# Patient Record
Sex: Female | Born: 1995 | Race: White | Hispanic: No | Marital: Married | Smoking: Never smoker
Health system: Southern US, Community
[De-identification: ages and names within clinical notes are randomized; demographics above are authoritative.]

## PROBLEM LIST (undated history)

## (undated) DIAGNOSIS — M7989 Other specified soft tissue disorders: Secondary | ICD-10-CM

## (undated) DIAGNOSIS — R0602 Shortness of breath: Secondary | ICD-10-CM

## (undated) DIAGNOSIS — F909 Attention-deficit hyperactivity disorder, unspecified type: Secondary | ICD-10-CM

## (undated) DIAGNOSIS — R7303 Prediabetes: Secondary | ICD-10-CM

## (undated) DIAGNOSIS — K5900 Constipation, unspecified: Secondary | ICD-10-CM

## (undated) DIAGNOSIS — S62101A Fracture of unspecified carpal bone, right wrist, initial encounter for closed fracture: Secondary | ICD-10-CM

## (undated) DIAGNOSIS — F341 Dysthymic disorder: Secondary | ICD-10-CM

## (undated) DIAGNOSIS — F329 Major depressive disorder, single episode, unspecified: Secondary | ICD-10-CM

## (undated) DIAGNOSIS — S62102A Fracture of unspecified carpal bone, left wrist, initial encounter for closed fracture: Secondary | ICD-10-CM

## (undated) DIAGNOSIS — F32A Depression, unspecified: Secondary | ICD-10-CM

## (undated) DIAGNOSIS — N946 Dysmenorrhea, unspecified: Secondary | ICD-10-CM

## (undated) DIAGNOSIS — T7840XA Allergy, unspecified, initial encounter: Secondary | ICD-10-CM

## (undated) DIAGNOSIS — F419 Anxiety disorder, unspecified: Secondary | ICD-10-CM

## (undated) DIAGNOSIS — J45909 Unspecified asthma, uncomplicated: Secondary | ICD-10-CM

## (undated) DIAGNOSIS — S43006A Unspecified dislocation of unspecified shoulder joint, initial encounter: Secondary | ICD-10-CM

## (undated) DIAGNOSIS — F913 Oppositional defiant disorder: Secondary | ICD-10-CM

## (undated) DIAGNOSIS — E559 Vitamin D deficiency, unspecified: Secondary | ICD-10-CM

## (undated) DIAGNOSIS — Z8619 Personal history of other infectious and parasitic diseases: Secondary | ICD-10-CM

## (undated) DIAGNOSIS — G43909 Migraine, unspecified, not intractable, without status migrainosus: Secondary | ICD-10-CM

## (undated) HISTORY — DX: Prediabetes: R73.03

## (undated) HISTORY — DX: Unspecified dislocation of unspecified shoulder joint, initial encounter: S43.006A

## (undated) HISTORY — DX: Vitamin D deficiency, unspecified: E55.9

## (undated) HISTORY — DX: Allergy, unspecified, initial encounter: T78.40XA

## (undated) HISTORY — DX: Anxiety disorder, unspecified: F41.9

## (undated) HISTORY — DX: Fracture of unspecified carpal bone, left wrist, initial encounter for closed fracture: S62.102A

## (undated) HISTORY — DX: Unspecified asthma, uncomplicated: J45.909

## (undated) HISTORY — DX: Shortness of breath: R06.02

## (undated) HISTORY — DX: Depression, unspecified: F32.A

## (undated) HISTORY — DX: Personal history of other infectious and parasitic diseases: Z86.19

## (undated) HISTORY — DX: Attention-deficit hyperactivity disorder, unspecified type: F90.9

## (undated) HISTORY — DX: Oppositional defiant disorder: F91.3

## (undated) HISTORY — DX: Constipation, unspecified: K59.00

## (undated) HISTORY — DX: Major depressive disorder, single episode, unspecified: F32.9

## (undated) HISTORY — DX: Other specified soft tissue disorders: M79.89

## (undated) HISTORY — DX: Migraine, unspecified, not intractable, without status migrainosus: G43.909

## (undated) HISTORY — DX: Dysthymic disorder: F34.1

## (undated) HISTORY — DX: Dysmenorrhea, unspecified: N94.6

## (undated) HISTORY — DX: Fracture of unspecified carpal bone, right wrist, initial encounter for closed fracture: S62.101A

---
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
## 2014-01-07 ENCOUNTER — Ambulatory Visit (INDEPENDENT_AMBULATORY_CARE_PROVIDER_SITE_OTHER): Payer: BC Managed Care – PPO | Admitting: Internal Medicine

## 2014-01-07 VITALS — BP 122/78 | HR 74 | Temp 98.2°F | Resp 18 | Ht 64.5 in | Wt 242.0 lb

## 2014-01-07 DIAGNOSIS — J019 Acute sinusitis, unspecified: Secondary | ICD-10-CM

## 2014-01-07 MED ORDER — AMOXICILLIN 875 MG PO TABS: 875.0000 mg | ORAL_TABLET | Freq: Two times a day (BID) | ORAL | Status: DC

## 2014-01-07 NOTE — Progress Notes (Signed)
This chart was scribed for Herbie Baltimore, MD by Einar Pheasant, ED Scribe. This patient was seen in room 10 and the patient's care was started at 4:06 PM.  Subjective:    Patient ID: Wendy Levy, female    DOB: Mar 14, 1996, 18 y.o.   MRN: [id number] Chief Complaint  Patient presents with  . flu like sxs    2 weeks   . Headache    today   . Excessive Sweating    HPI HPI Comments: Wendy Levy is a 18 y.o. female who presents to [facility] complaining of flu like symptoms that stated 2 weeks ago. She is also complaining of associated headache, congestion, and diaphoresis. Pt states that every morning upon waking she has a sore throat and she finds herself coughing once in a while. Mother states that she has not been able to complete a full day of school ever since the onset of her symptoms. Pt denies any fever.    Patient Active Problem List   Diagnosis Date Noted  . Migraine, unspecified, without mention of intractable migraine without mention of status migrainosus 03/15/2013  . Major depressive disorder, recurrent episode, in partial or unspecified remission 01/31/2012  . ADHD (attention deficit hyperactivity disorder), combined type 11/02/2011  . ODD (oppositional defiant disorder) 11/02/2011  . Dysthymic disorder 11/02/2011   Past Medical History  Diagnosis Date  . ADHD (attention deficit hyperactivity disorder)   . Depression   . Oppositional defiant disorder   . Wrist fracture, bilateral   . Shoulder dislocation     bilateral   Past Surgical History  Procedure Laterality Date  . Wisdom tooth extraction  04/2013   No Known Allergies Prior to Admission medications   Medication Sig Start Date End Date Taking? Authorizing Provider  cetirizine (ZYRTEC) 10 MG tablet Take 10 mg by mouth daily as needed for allergies.   Yes Historical Provider, MD  FLUoxetine (PROZAC) 20 MG capsule TAKE 1 CAPSULE BY MOUTH ONCE DAILY 11/20/13  Yes Hampton Abbot, MD    lisdexamfetamine (VYVANSE) 40 MG capsule Take 1 capsule (40 mg total) by mouth every morning. 12/03/13 03/04/14 Yes Hampton Abbot, MD  norethindrone-ethinyl estradiol-iron (MICROGESTIN FE,GILDESS FE,LOESTRIN FE) 1.5-30 MG-MCG tablet Take 1 tablet by mouth daily. 03/15/13  Yes Fara Chute, PA-C   History   Social History  . Marital Status: Single    Spouse Name: n/a    Number of Children: 0  . Years of Education: N/A   Occupational History  . 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 Smoking status: Never Smoker   . Smokeless tobacco: Never Used  . Alcohol Use: No  . Drug Use: No  . Sexual Activity: Not Currently    Birth Control/ Protection: Condom   Other Topics Concern  . Not on file   Social History Narrative   Lives with her mother (adopted).  Her sister lives in [place], [place].     Review of Systems A complete 10 system review of systems was obtained and all systems are negative except as noted in the HPI and PMH.       Triage vitals: BP 122/78  Pulse 74  Temp(Src) 98.2 F (36.8 C) (Oral)  Resp 18  Ht 5' 4.5" (1.638 m)  Wt 242 lb (109.77 kg)  BMI 40.91 kg/m2  SpO2 98%  LMP 12/10/2013  Objective:   Physical Exam  Nursing note and vitals reviewed. Constitutional:  Overweight  HENT:  Right Ear: External ear normal.  Left Ear: External ear normal.  Mouth/Throat: Oropharynx is clear and moist.  Purulent discharge from nares.  Eyes: Conjunctivae are normal.  Pulmonary/Chest: Effort normal and breath sounds normal. No respiratory distress. She has no wheezes. She has no rales. She exhibits no tenderness.  Lymphadenopathy:    She has no cervical adenopathy.          Assessment & Plan:   Acute sinusitis, unspecified  Meds ordered this encounter  Medications  . amoxicillin (AMOXIL) 875 MG tablet    Sig: Take 1 tablet (875 mg total) by mouth 2 (two) times daily.    Dispense:  20 tablet    Refill:  0    Sudafed 12 Hour twice a day for 5 days  I have completed the patient encounter in its entirety as documented by the scribe, with editing by me where necessary. Michole Lecuyer P. Laney Pastor, M.D.

## 2014-01-16 ENCOUNTER — Ambulatory Visit (INDEPENDENT_AMBULATORY_CARE_PROVIDER_SITE_OTHER): Payer: BC Managed Care – PPO | Admitting: Psychology

## 2014-01-16 DIAGNOSIS — F341 Dysthymic disorder: Secondary | ICD-10-CM

## 2014-01-16 NOTE — Progress Notes (Signed)
   THERAPIST PROGRESS NOTE  Session Time: 3.25pm-4.10pm  Participation Level: Active  Behavioral Response: Well GroomedAlertWNL  Type of Therapy: Individual Therapy  Treatment Goals addressed: Diagnosis: Dysthymia and goal 1&2.  Interventions: CBT and Supportive  Summary: Wendy Levy is a 18 y.o. female who presents with affect WNL. Pt reports that she has missed a lot of school recently due to flu like symptoms and sinus infection. Pt reports somewhat overhwelmed by makeup work but feels good support.  Pt reported did well on semester grades 3As, 2Bs, 1C.  Pt reports "senioritis" but keeping goal directed for graduation.  Pt reported on how she and sister are communicating and how she has been able to assert self w/sister and accept that sister cannot be consistent emotional support to her current.  Suicidal/Homicidal: Nowithout intent/plan  Therapist Response: Assessed pt current functioning per her report.  Processed w/pt missed days and stressors w/ school work.  Encouraged pt to utilize supports and plan for slow steady approach.  Processed w/pt communication w/ sister and reiterated i messages.   Plan: Return again in 2 weeks.  Diagnosis: Axis I: Dysthymic Disorder    Axis II: No diagnosis    Siniya Lichty, LPC 01/16/2014

## 2014-02-06 ENCOUNTER — Ambulatory Visit (INDEPENDENT_AMBULATORY_CARE_PROVIDER_SITE_OTHER): Payer: BC Managed Care – PPO | Admitting: Psychology

## 2014-02-06 DIAGNOSIS — F341 Dysthymic disorder: Secondary | ICD-10-CM

## 2014-02-06 NOTE — Progress Notes (Signed)
   THERAPIST PROGRESS NOTE  Session Time: 2.25pm-2.45pm  Participation Level: Active  Behavioral Response: Well GroomedAlertAnxious  Type of Therapy: Individual Therapy  Treatment Goals addressed: Diagnosis: dysthymia and improving academic functioing and effective communication  Interventions: Solution Focused, Psychosocial Skills: effective communication skills and Supportive  Summary: Wendy Levy is a 19 y.o. female who presents with expressed worry and anxiety.  Pt informed of drop in grades on recent progress reports and avoidance in sharing w/ mom for fear of mom disappointment and further pushing her.  Pt identifies grades drop w/ missed school days due to illness and assignments not turned in.  Pt was able to identify a plan for completion and focus on school assignments for improving by report cards.  Pt also reported stress that she has been dropped from online class as wasn't approved in time.  Pt agreed that better for her to communicate to mom and not mom to find out from teachers. Pt was able to identify assertive ways of expressing self and communicating her plan to mom.   Suicidal/Homicidal: Nowithout intent/plan  Therapist Response: assessed pt current functioning per her report.  Processed w/ pt stressor and how to approach for best outcome. encouraged pt to not avoid and assisted in identify how to communicate to mom.  Discussed w/ pt her plan for bringing up grades.  Plan: Return again in 2 weeks.  Diagnosis: Axis I: ADHD, combined type and Dysthymic Disorder    Axis II: No diagnosis    YATES,LEANNE, LPC 02/06/2014

## 2014-02-12 ENCOUNTER — Ambulatory Visit (INDEPENDENT_AMBULATORY_CARE_PROVIDER_SITE_OTHER): Payer: BC Managed Care – PPO | Admitting: Family Medicine

## 2014-02-12 ENCOUNTER — Ambulatory Visit: Payer: BC Managed Care – PPO

## 2014-02-12 VITALS — BP 112/80 | HR 90 | Temp 98.0°F | Resp 16 | Ht 64.5 in | Wt 246.2 lb

## 2014-02-12 DIAGNOSIS — R109 Unspecified abdominal pain: Secondary | ICD-10-CM

## 2014-02-12 DIAGNOSIS — R300 Dysuria: Secondary | ICD-10-CM

## 2014-02-12 DIAGNOSIS — Z13220 Encounter for screening for lipoid disorders: Secondary | ICD-10-CM

## 2014-02-12 DIAGNOSIS — Z131 Encounter for screening for diabetes mellitus: Secondary | ICD-10-CM

## 2014-02-12 DIAGNOSIS — R319 Hematuria, unspecified: Secondary | ICD-10-CM

## 2014-02-12 DIAGNOSIS — R3913 Splitting of urinary stream: Secondary | ICD-10-CM

## 2014-02-12 LAB — POCT URINALYSIS DIPSTICK
Bilirubin, UA: NEGATIVE
Glucose, UA: NEGATIVE
Ketones, UA: NEGATIVE
Leukocytes, UA: NEGATIVE
Nitrite, UA: NEGATIVE
Protein, UA: NEGATIVE
Spec Grav, UA: 1.015
Urobilinogen, UA: 0.2
pH, UA: 6

## 2014-02-12 LAB — POCT CBC
Granulocyte percent: 71.8 %G (ref 37–80)
HCT, POC: 44.8 % (ref 37.7–47.9)
Hemoglobin: 14.3 g/dL (ref 12.2–16.2)
Lymph, poc: 3.2 (ref 0.6–3.4)
MCH, POC: 27.9 pg (ref 27–31.2)
MCHC: 31.9 g/dL (ref 31.8–35.4)
MCV: 87.4 fL (ref 80–97)
MID (cbc): 0.7 (ref 0–0.9)
MPV: 8.7 fL (ref 0–99.8)
POC Granulocyte: 9.8 — AB (ref 2–6.9)
POC LYMPH PERCENT: 23.2 % (ref 10–50)
POC MID %: 5 %M (ref 0–12)
Platelet Count, POC: 400 10*3/uL (ref 142–424)
RBC: 5.13 M/uL (ref 4.04–5.48)
RDW, POC: 15.1 %
WBC: 13.6 10*3/uL — AB (ref 4.6–10.2)

## 2014-02-12 LAB — COMPLETE METABOLIC PANEL WITH GFR
Albumin: 4.2 g/dL (ref 3.5–5.2)
Alkaline Phosphatase: 73 U/L (ref 39–117)
BUN: 11 mg/dL (ref 6–23)
CO2: 20 mEq/L (ref 19–32)
Calcium: 9.4 mg/dL (ref 8.4–10.5)
Chloride: 100 mEq/L (ref 96–112)
Creat: 0.73 mg/dL (ref 0.50–1.10)
GFR, Est African American: >89 mL/min
GFR, Est Non African American: >89 mL/min
Glucose, Bld: 76 mg/dL (ref 70–99)
Potassium: 5 mEq/L (ref 3.5–5.3)
Total Protein: 7.1 g/dL (ref 6.0–8.3)

## 2014-02-12 LAB — LIPID PANEL
Cholesterol: 211 mg/dL — ABNORMAL HIGH (ref 0–169)
HDL: 51 mg/dL (ref >34)
LDL Cholesterol: 125 mg/dL — ABNORMAL HIGH (ref 0–109)
Total CHOL/HDL Ratio: 4.1 Ratio
Triglycerides: 174 mg/dL — ABNORMAL HIGH (ref <150)
VLDL: 35 mg/dL (ref 0–40)

## 2014-02-12 LAB — COMPLETE METABOLIC PANEL WITHOUT GFR
ALT: 23 U/L (ref 0–35)
AST: 24 U/L (ref 0–37)
Sodium: 136 meq/L (ref 135–145)
Total Bilirubin: 0.5 mg/dL (ref 0.2–1.1)

## 2014-02-12 LAB — POCT UA - MICROSCOPIC ONLY
Bacteria, U Microscopic: NEGATIVE
Casts, Ur, LPF, POC: NEGATIVE
Crystals, Ur, HPF, POC: NEGATIVE
Mucus, UA: NEGATIVE
WBC, Ur, HPF, POC: NEGATIVE
Yeast, UA: NEGATIVE

## 2014-02-12 LAB — POCT GLYCOSYLATED HEMOGLOBIN (HGB A1C): Hemoglobin A1C: 5.2

## 2014-02-12 LAB — POCT URINE PREGNANCY: Preg Test, Ur: NEGATIVE

## 2014-02-12 MED ORDER — CIPROFLOXACIN HCL 250 MG PO TABS: 250.0000 mg | ORAL_TABLET | Freq: Two times a day (BID) | ORAL | Status: DC

## 2014-02-12 NOTE — Patient Instructions (Signed)

## 2014-02-12 NOTE — Progress Notes (Signed)
Chief Complaint:  Chief Complaint  Patient presents with  . Flank Pain    Noticed yesturday   . Dysuria  . Nausea    last night     HPI: Wendy Levy is a 18 y.o. female who is here for right side flank pain that shoots down to the lower abdomen on the right side started yesterday.  Associated with nausea. She has had the same pain this morning. 4-5/10 discomfort, not constant. It occurred before she ate yesterday. She also has had some burning during urination. No fever, no vomiting, she states she did have nausea. She has not noticed any blood in her urine. She has never had a kidney stone in the past no family history of kidney stones. She has had a history of similar sxs in 06/2013 and [place] of abd was negative for gallstones , she had urine cx which was inconclusive and nonspecific, per mom she got better with cipro. Her UA at that time had both trace leuks and blood. Denies fevers, chills, abdominal pain  She had her LMP 1 week ago, on birth control She denies constipation  Past Medical History  Diagnosis Date  . ADHD (attention deficit hyperactivity disorder)   . Depression   . Oppositional defiant disorder   . Wrist fracture, bilateral   . Shoulder dislocation     bilateral   Past Surgical History  Procedure Laterality Date  . Wisdom tooth extraction  04/2013   History   Social History  . Marital Status: Single    Spouse Name: n/a    Number of Children: 0  . Years of Education: N/A   Occupational History  . 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 Smoking status: Never Smoker   . Smokeless tobacco: Never Used  . Alcohol Use: No  . Drug Use: No  . Sexual Activity: Not Currently    Birth Control/ Protection: Condom   Other Topics Concern  . None   Social History Narrative   Lives with her mother (adopted).  Her sister lives in [place], [place].   Family History  Problem Relation Age of Onset  . Adopted: Yes    . Depression Sister     reportedly in and out of treatment facilities, also dx w/ RAD  . Suicidality Sister    No Known Allergies Prior to Admission medications   Medication Sig Start Date End Date Taking? Authorizing Provider  cetirizine (ZYRTEC) 10 MG tablet Take 10 mg by mouth daily as needed for allergies.   Yes Historical Provider, MD  FLUoxetine (PROZAC) 20 MG capsule TAKE 1 CAPSULE BY MOUTH ONCE DAILY 11/20/13  Yes Hampton Abbot, MD  lisdexamfetamine (VYVANSE) 40 MG capsule Take 1 capsule (40 mg total) by mouth every morning. 12/03/13 03/04/14 Yes Hampton Abbot, MD  norethindrone-ethinyl estradiol-iron (MICROGESTIN FE,GILDESS FE,LOESTRIN FE) 1.5-30 MG-MCG tablet Take 1 tablet by mouth daily. 03/15/13  Yes Chelle S Jeffery, PA-C  amoxicillin (AMOXIL) 875 MG tablet Take 1 tablet (875 mg total) by mouth 2 (two) times daily. 01/07/14   Leandrew Koyanagi, MD     ROS: The patient denies fevers, chills, night sweats, unintentional weight loss, chest pain, palpitations, wheezing, dyspnea on exertion,  vomiting, abdominal pain, dysuria,  hematuria, melena, numbness, weakness, or tingling.   All other systems have been reviewed and were otherwise negative with the exception of those mentioned in the HPI and as above.  PHYSICAL EXAM: Filed Vitals:   02/12/14 0852  BP: 112/80  Pulse: 90  Temp: 98 F (36.7 C)  Resp: 16   Filed Vitals:   02/12/14 0852  Height: 5' 4.5" (1.638 m)  Weight: 246 lb 3.2 oz (111.676 kg)   Body mass index is 41.62 kg/(m^2).  General: Alert, no acute distress, obese white female HEENT:  Normocephalic, atraumatic, oropharynx patent. EOMI, PERRLA Cardiovascular:  Regular rate and rhythm, no rubs murmurs or gallops.  No Carotid bruits, radial pulse intact. No pedal edema.  Respiratory: Clear to auscultation bilaterally.  No wheezes, rales, or rhonchi.  No cyanosis, no use of accessory musculature GI: No organomegaly, abdomen is soft and non-tender at thsi time, she  doe snot even have flank pain, she does not appear to have an acute abdomen. positive bowel sounds.  No masses. No CVA tenderness Skin: No rashes. Neurologic: Facial musculature symmetric. Psychiatric: Patient is appropriate throughout our interaction. Lymphatic: No cervical lymphadenopathy Musculoskeletal: Gait intact.   LABS: Results for orders placed in visit on 02/12/14  POCT URINALYSIS DIPSTICK      Result Value Ref Range   Color, UA yellow     Clarity, UA clear     Glucose, UA neg     Bilirubin, UA neg     Ketones, UA neg     Spec Grav, UA 1.015     Blood, UA large     pH, UA 6.0     Protein, UA neg     Urobilinogen, UA 0.2     Nitrite, UA neg     Leukocytes, UA Negative    POCT UA - MICROSCOPIC ONLY      Result Value Ref Range   WBC, Ur, HPF, POC neg     RBC, urine, microscopic 8-12     Bacteria, U Microscopic neg     Mucus, UA neg     Epithelial cells, urine per micros 6-12     Crystals, Ur, HPF, POC neg     Casts, Ur, LPF, POC neg     Yeast, UA neg    POCT CBC      Result Value Ref Range   WBC 13.6 (*) 4.6 - 10.2 K/uL   Lymph, poc 3.2  0.6 - 3.4   POC LYMPH PERCENT 23.2  10 - 50 %L   MID (cbc) 0.7  0 - 0.9   POC MID % 5.0  0 - 12 %M   POC Granulocyte 9.8 (*) 2 - 6.9   Granulocyte percent 71.8  37 - 80 %G   RBC 5.13  4.04 - 5.48 M/uL   Hemoglobin 14.3  12.2 - 16.2 g/dL   HCT, POC 44.8  37.7 - 47.9 %   MCV 87.4  80 - 97 fL   MCH, POC 27.9  27 - 31.2 pg   MCHC 31.9  31.8 - 35.4 g/dL   RDW, POC 15.1     Platelet Count, POC 400  142 - 424 K/uL   MPV 8.7  0 - 99.8 fL  POCT URINE PREGNANCY      Result Value Ref Range   Preg Test, Ur Negative    POCT GLYCOSYLATED HEMOGLOBIN (HGB A1C)      Result Value Ref Range   Hemoglobin A1C 5.2       EKG/XRAY:   Primary read interpreted by Dr. Marin Comment at [facility] No appreciable stones + mod stool burden on right side   ASSESSMENT/PLAN: Encounter Diagnoses  Name Primary?  Marland Kitchen  Dysuria Yes  . Flank pain   . Screening  for hyperlipidemia   . Screening for diabetes mellitus   . Hematuria    ? Renal stones vs cholecystitis vs less like appendicitis vs less likely constipation , although UA has hematuria and a leukocytosis Mom wants conservative treatment No CT scan for now She wil wait and if worsening sxs then will get stat CT scan or go to ER Push fluids, bland diet Cipro 250 mg BID x 3 days for ? UTI and will culture urine F/u by phone or prn   Gross sideeffects, risk and benefits, and alternatives of medications d/w patient. Patient is aware that all medications have potential sideeffects and we are unable to predict every sideeffect or drug-drug interaction that may occur.  LE, [place], DO 02/12/2014 10:42 AM

## 2014-02-13 LAB — URINE CULTURE: Colony Count: 80000

## 2014-02-25 ENCOUNTER — Ambulatory Visit (INDEPENDENT_AMBULATORY_CARE_PROVIDER_SITE_OTHER): Payer: BC Managed Care – PPO | Admitting: Psychiatry

## 2014-02-25 ENCOUNTER — Encounter (HOSPITAL_COMMUNITY): Payer: Self-pay | Admitting: Psychiatry

## 2014-02-25 VITALS — BP 122/67 | HR 98 | Ht 63.5 in | Wt 244.2 lb

## 2014-02-25 DIAGNOSIS — F329 Major depressive disorder, single episode, unspecified: Secondary | ICD-10-CM

## 2014-02-25 DIAGNOSIS — F909 Attention-deficit hyperactivity disorder, unspecified type: Secondary | ICD-10-CM

## 2014-02-25 DIAGNOSIS — F913 Oppositional defiant disorder: Secondary | ICD-10-CM

## 2014-02-25 DIAGNOSIS — F341 Dysthymic disorder: Secondary | ICD-10-CM

## 2014-02-25 MED ORDER — LISDEXAMFETAMINE DIMESYLATE 40 MG PO CAPS
40.0000 mg | ORAL_CAPSULE | ORAL | Status: DC
Start: 2014-02-25 — End: 2014-05-28

## 2014-02-25 MED ORDER — FLUOXETINE HCL 20 MG PO CAPS: ORAL_CAPSULE | ORAL | Status: DC

## 2014-02-25 NOTE — Progress Notes (Signed)
Patient ID: Wendy Levy, female   DOB: 05-26-1996, 18 y.o.   MRN: [id number]  [place] [id number] Progress Note  Wendy Levy [id number] 18 y.o.   Chief Complaint: I am doing well at   History of Present Illness: Patient is a 18 year old diagnosed with ADHD combined type, major depressive disorder who presents today for a followup visit. Patient states that she continues to do well in regards to her mood and focus. She adds that she fell behind a little on her schoolwork as she was sick, is playing catch up right now and adds that her grades are good. She denies any symptoms of depression, any complaints at this visit. She also reports that socially she's doing well. She adds that she plans to start looking for a job this summer. Mom agrees with the patient reports overall she seems to be doing fairly well. They both deny any aggravating or relieving factors. Patient reports on a scale of 0-10, with 0 being no symptoms in 10 being the worst that her depression is a 1/10. They both deny any complaints at this visit, any side effects of the medications, any safety concerns   Suicidal Ideation: No Plan Formed: No Patient has means to carry out plan: No  Homicidal Ideation: No Plan Formed: No Patient has means to carry out plan: No  Review of Systems:  Review of Systems  Constitution: Negative. Negative for decreased appetite, fever, weakness and weight gain.  HENT: Negative.  Negative for headaches.   Cardiovascular: Negative.  Negative for chest pain and palpitations.  Respiratory: Negative.   Endocrine: Negative.   Hematologic/Lymphatic: Negative.   Skin: Negative.   Musculoskeletal: Negative.   Gastrointestinal: Negative.   Psychiatric/Behavioral: Negative.       Past Medical Family, Social History: Patient is a 19th grade and lives with her adoptive mom. Patient will be starting at [employer] next academic year  Outpatient Encounter Prescriptions as of 02/25/2014   Medication Sig  . cetirizine (ZYRTEC) 10 MG tablet Take 10 mg by mouth daily as needed for allergies.  Marland Kitchen FLUoxetine (PROZAC) 20 MG capsule TAKE 1 CAPSULE BY MOUTH ONCE DAILY  . lisdexamfetamine (VYVANSE) 40 MG capsule Take 1 capsule (40 mg total) by mouth every morning.  . norethindrone-ethinyl estradiol-iron (MICROGESTIN FE,GILDESS FE,LOESTRIN FE) 1.5-30 MG-MCG tablet Take 1 tablet by mouth daily.  . [DISCONTINUED] ciprofloxacin (CIPRO) 250 MG tablet Take 1 tablet (250 mg total) by mouth 2 (two) times daily.  . [DISCONTINUED] FLUoxetine (PROZAC) 20 MG capsule TAKE 1 CAPSULE BY MOUTH ONCE DAILY  . [DISCONTINUED] lisdexamfetamine (VYVANSE) 40 MG capsule Take 1 capsule (40 mg total) by mouth every morning.    Past Psychiatric History/Hospitalization(s): Anxiety: No Bipolar Disorder: No Depression: Yes Mania: No Psychosis: No Schizophrenia: No Personality Disorder: No Hospitalization for psychiatric illness: No History of Electroconvulsive Shock Therapy: No Prior Suicide Attempts: No  Physical Exam: Constitutional:  BP 122/67  Pulse 98  Ht 5' 3.5" (1.613 m)  Wt 244 lb 3.2 oz (110.768 kg)  BMI 42.57 kg/m2  LMP 01/29/2014  General Appearance: alert, oriented, no acute distress and obese  Musculoskeletal: Strength & Muscle Tone: within normal limits Gait & Station: normal Patient leans: N/A  Psychiatric: Speech (describe rate, volume, coherence, spontaneity, and abnormalities if any): Normal in volume rate and tone spontaneous  Thought Process (describe rate, content, abstract reasoning, and computation): Organized, goal-directed, age-appropriate  Associations: Intact  Thoughts: normal  Mental Status: Orientation: oriented to person, place and situation Mood &  Affect: normal affect Attention Span & Concentration: OK Recent and remote memories are intact and age-appropriate Cognition is intact and age-appropriate Insight and judgment seems fair at this visit Fund of  knowledge:fair Language: fair Medical Decision Making (Choose Three): Established Problem, Stable/Improving (1), Review of Psycho-Social Stressors (1), Review of Last Therapy Session (1) and Review of Medication Regimen & Side Effects (2)  Assessment: Axis I: ADHD combined type, moderate severity, oppositional defiant disorder, dysthymic disorder  Axis II: Deferred  Axis III: Obese  Axis IV: Mild to moderate  Axis V: 65 to 70   Plan: ADHD combined type:  Continue Vyvanse 40 mg one in the morning for ADHD inattentive type. Dysthymic disorder: Continue Prozac 20 MG one in the morning for dysthymia Obesity: Discussed keeping a food log to help with making better choices. Patient states that she will try to do this. Oppositional defiant disorder: Mom reports that the patient is doing well with her behavior  Call when necessary Follow up in 3 months  Hampton Abbot, MD

## 2014-02-27 ENCOUNTER — Encounter (HOSPITAL_COMMUNITY): Payer: Self-pay | Admitting: Psychology

## 2014-02-27 ENCOUNTER — Ambulatory Visit (INDEPENDENT_AMBULATORY_CARE_PROVIDER_SITE_OTHER): Payer: BC Managed Care – PPO | Admitting: Psychology

## 2014-02-27 DIAGNOSIS — F902 Attention-deficit hyperactivity disorder, combined type: Secondary | ICD-10-CM

## 2014-02-27 DIAGNOSIS — F909 Attention-deficit hyperactivity disorder, unspecified type: Secondary | ICD-10-CM

## 2014-02-27 DIAGNOSIS — IMO0002 Reserved for concepts with insufficient information to code with codable children: Secondary | ICD-10-CM

## 2014-02-27 NOTE — Progress Notes (Signed)
   THERAPIST PROGRESS NOTE  Session Time: 8.06am-8.45am  Participation Level: Active  Behavioral Response: Casual and Fairly GroomedAlertAFFECT WNL  Type of Therapy: Individual Therapy  Treatment Goals addressed: Diagnosis: MDD, ADHD and goal 1&2.  Interventions: CBT and Supportive  Summary: Wendy Levy is a 18 y.o. female who presents with tired but bright affect.  Pt reported that discussions w/ mom about grades went well- mom was understanding and pt was able to talk w/mom.  Pt reported she has brought her grades up and is keeping up work to maintain.  Pt discussed planning for prom and communicating w/mom about this as well.  Pt reports mood has been good, she is tired often, but otherwise doing well.  She is looking forward to summer mission trip.  Pt asked to end session early to be able to make up time at school for missed days. Mom did inform that pt had recent blood work that indicated prediabetic.   Suicidal/Homicidal: Nowithout intent/plan  Therapist Response: Assessed pt current functioning per pt report.  Explored w/pt interactions w/ mom and how things turned out w/ communicating w mom.  Processed how she was able to be more open w/ mom and differences in communication had positive outcomes.  Explored upcoming activities. Discussed plan for school progress.  Plan: Return again in 2 weeks.  Diagnosis: Axis I: ADHD, combined type and MDD    Axis II: No diagnosis    YATES,LEANNE, [place] 02/27/2014

## 2014-03-01 ENCOUNTER — Encounter: Payer: Self-pay | Admitting: Physician Assistant

## 2014-03-04 ENCOUNTER — Encounter: Payer: Self-pay | Admitting: Physician Assistant

## 2014-03-20 ENCOUNTER — Encounter (HOSPITAL_COMMUNITY): Payer: Self-pay | Admitting: Psychology

## 2014-03-20 ENCOUNTER — Ambulatory Visit (INDEPENDENT_AMBULATORY_CARE_PROVIDER_SITE_OTHER): Payer: BC Managed Care – PPO | Admitting: Psychology

## 2014-03-20 DIAGNOSIS — F909 Attention-deficit hyperactivity disorder, unspecified type: Secondary | ICD-10-CM

## 2014-03-20 DIAGNOSIS — IMO0002 Reserved for concepts with insufficient information to code with codable children: Secondary | ICD-10-CM

## 2014-03-20 DIAGNOSIS — F902 Attention-deficit hyperactivity disorder, combined type: Secondary | ICD-10-CM

## 2014-03-20 NOTE — Progress Notes (Signed)
   THERAPIST PROGRESS NOTE  Session Time: 8.05am-8.48am  Participation Level: Active  Behavioral Response: Well GroomedAlertTired reported  Type of Therapy: Individual Therapy  Treatment Goals addressed: Diagnosis: ADHD, MDD and goal1  Interventions: Motivational Interviewing and Supportive  Summary: Lanique Gonzalo is a 18 y.o. female who presents with report of feeling tired.  Pt reported she did well on her report card- As and Cs, however behind in online class and failed to turn in project in [place].  Pt aware that these 2 things will greatly effect grade in last quarter.  Pt discussed negative outcomes and steps she can take.  Pt did initial talk about things that effecting- but was able to accept ownership for problem solving what she can do.  Pt wasn't able to commit w/ how she wanted to communicate w/ english teacher but understood her options and potential outcomes towards goals for graduating. .  Suicidal/Homicidal: Nowithout intent/plan  Therapist Response: assessed pt current functioning per pt report.  Explored w/ pt styressors of academics and missing assignments.  Explored w/ pt barriers and goals.  Assisted pt in identifying options for approaching and outcomnes of choices and actions.  encouraged pt action towards goals.   Plan: Return again in 2 weeks.  Diagnosis: Axis I: ADHD, combined type and Mdd    Axis II: No diagnosis    Castor Gittleman, LPC 03/20/2014

## 2014-04-03 ENCOUNTER — Ambulatory Visit (INDEPENDENT_AMBULATORY_CARE_PROVIDER_SITE_OTHER): Payer: BC Managed Care – PPO | Admitting: Family Medicine

## 2014-04-03 VITALS — BP 128/80 | HR 91 | Temp 98.5°F | Resp 17 | Ht 64.0 in | Wt 248.0 lb

## 2014-04-03 DIAGNOSIS — Z23 Encounter for immunization: Secondary | ICD-10-CM

## 2014-04-03 DIAGNOSIS — N946 Dysmenorrhea, unspecified: Secondary | ICD-10-CM

## 2014-04-03 DIAGNOSIS — Z7189 Other specified counseling: Secondary | ICD-10-CM

## 2014-04-03 DIAGNOSIS — Z7184 Encounter for health counseling related to travel: Secondary | ICD-10-CM

## 2014-04-03 MED ORDER — TYPHOID VACCINE PO CPDR: 1.0000 | DELAYED_RELEASE_CAPSULE | ORAL | Status: DC

## 2014-04-03 MED ORDER — CHLOROQUINE PHOSPHATE 500 MG PO TABS: 500.0000 mg | ORAL_TABLET | Freq: Every day | ORAL | Status: DC

## 2014-04-03 MED ORDER — LEVONORGESTREL-ETHINYL ESTRAD 0.1-20 MG-MCG PO TABS: 1.0000 | ORAL_TABLET | Freq: Every day | ORAL | Status: DC

## 2014-04-03 NOTE — Patient Instructions (Signed)
Try changing to the new birth control pill- just start it when you would have started your new pack.  Also, try taking 200 to 400 mg of ibuprofen three times a day, starting a couple of days prior to your period and continue during your period  Use the typhoid oral vaccine- start this asap.  Take one every other day for 4 doses Use chloroquine for malaria prophylaxis.  Take one pill a week- start 1 or 2 weeks prior to travel and continue for 4 weeks after travel.  You got a Tdap booster today

## 2014-04-03 NOTE — Progress Notes (Signed)
[facility] [address], [place] [address] [phone]  Date:  04/03/2014   Name:  Wendy Levy   DOB:  [birth year]   MRN:  [id number]  PCP:  Lamar Blinks, MD    Chief Complaint: Travel Consult   History of Present Illness:  Wendy Levy is a 18 y.o. very pleasant female patient who presents with the following:  Here today to discuss travel- she will be traveling to [place] in June- she will be there for one week with a church mission group.  They will be doing Architect and bible study.  This is her second year going. She was givne typhoid vaccine last year and did not end up taking it due to being on abx.   They will be travling to [place] where there is malaria per the CDC.  She used malaria prophylaxis last year.   She is UTD on her hep a and hep b vaccines.  She did have a Tdap in 2008- her mother is concerned as they will be far from any health care on this trip.  She wonders if we can update her tetanus shot today.     She has had menstrual cramps for 3 or 4 years.  She had menarche in 4th grade, cramps come on as she got older.  She has been on OCP for one year- this has helped some but has not gotten rid of her cramps.  Her bleeding is better, but her cramps have not improved.  She is on her menses right now- she will start a new pack of pills this weekend.  She does have a history of occasional migraine HA- more associated with allergies- but does not get aura  Patient Active Problem List   Diagnosis Date Noted  . Migraine, unspecified, without mention of intractable migraine without mention of status migrainosus 03/15/2013  . Major depressive disorder, recurrent episode, in partial or unspecified remission 01/31/2012  . ADHD (attention deficit hyperactivity disorder), combined type 11/02/2011  . ODD (oppositional defiant disorder) 11/02/2011  . Dysthymic disorder 11/02/2011    Past Medical History  Diagnosis Date  . ADHD (attention deficit  hyperactivity disorder)   . Depression   . Oppositional defiant disorder   . Wrist fracture, bilateral   . Shoulder dislocation     bilateral    Past Surgical History  Procedure Laterality Date  . Wisdom tooth extraction  04/2013    History  Substance Use Topics  . Smoking status: Never Smoker   . Smokeless tobacco: Never Used  . Alcohol Use: No    Family History  Problem Relation Age of Onset  . Adopted: Yes  . Depression Sister     reportedly in and out of treatment facilities, also dx w/ RAD  . Suicidality Sister     No Known Allergies  Medication list has been reviewed and updated.  Current Outpatient Prescriptions on File Prior to Visit  Medication Sig Dispense Refill  . cetirizine (ZYRTEC) 10 MG tablet Take 10 mg by mouth daily as needed for allergies.      Marland Kitchen FLUoxetine (PROZAC) 20 MG capsule TAKE 1 CAPSULE BY MOUTH ONCE DAILY  90 capsule  0  . lisdexamfetamine (VYVANSE) 40 MG capsule Take 1 capsule (40 mg total) by mouth every morning.  90 capsule  0  . norethindrone-ethinyl estradiol-iron (MICROGESTIN FE,GILDESS FE,LOESTRIN FE) 1.5-30 MG-MCG tablet Take 1 tablet by mouth daily.  3 Package  4   No current facility-administered medications on  file prior to visit.    Review of Systems:  As per HPI- otherwise negative.   Physical Examination: Filed Vitals:   04/03/14 0848  BP: 128/80  Pulse: 91  Temp: 98.5 F (36.9 C)  Resp: 17   Filed Vitals:   04/03/14 0848  Height: 5\' 4"  (1.626 m)  Weight: 248 lb (112.492 kg)   Body mass index is 42.55 kg/(m^2). Ideal Body Weight: Weight in (lb) to have BMI = 25: 145.3  GEN: WDWN, NAD, Non-toxic, A & O x 3, obese, looks well HEENT: Atraumatic, Normocephalic. Neck supple. No masses, No LAD. Ears and Nose: No external deformity. CV: RRR, No M/G/R. No JVD. No thrill. No extra heart sounds. PULM: CTA B, no wheezes, crackles, rhonchi. No retractions. No resp. distress. No accessory muscle use. ABD: S, NT, ND, +BS.  No rebound. No HSM. EXTR: No c/c/e NEURO Normal gait.  PSYCH: Normally interactive. Conversant. Not depressed or anxious appearing.  Calm demeanor.    Assessment and Plan: Need for prophylactic vaccination with combined diphtheria-tetanus-pertussis (DTP) vaccine - Plan: Tdap vaccine greater than or equal to 7yo IM  Menstrual cramps - Plan: levonorgestrel-ethinyl estradiol (AVIANE,ALESSE,LESSINA) 0.1-20 MG-MCG tablet  Travel advice encounter - Plan: typhoid (VIVOTIF BERNA VACCINE) DR capsule, chloroquine (ARALEN) 500 MG tablet  Wendy Levy would like to update her tetanus shot as she will be high risk for injury and far from medical care.  This is reasonable.  She was given a tdap vaccine- I had meant for her to get a td, as she already had the tdap.  However, there will be no harm from a second tdap. Explained this to pt and her mother and they understand, they are not concerned.    Rx for chlorquine for malaria prophylaxis, and vivotif oral typhoid vaccine.  Explained how to use.    Will try changing to a different progesterone OCP, and will have her pre- dose with ibuprofen before and during her menses.   Signed Lamar Blinks, MD

## 2014-04-08 ENCOUNTER — Telehealth: Payer: Self-pay

## 2014-04-08 DIAGNOSIS — Z7184 Encounter for health counseling related to travel: Secondary | ICD-10-CM

## 2014-04-08 MED ORDER — CHLOROQUINE PHOSPHATE 500 MG PO TABS: 500.0000 mg | ORAL_TABLET | Freq: Every day | ORAL | Status: DC

## 2014-04-08 MED ORDER — CHLOROQUINE PHOSPHATE 500 MG PO TABS: ORAL_TABLET | ORAL | Status: DC

## 2014-04-08 NOTE — Telephone Encounter (Signed)
Dr. Lorelei Pont, can you please verify the dosage on the chloroquine. The rx says to take both daily and weekly. Thanks

## 2014-04-08 NOTE — Telephone Encounter (Signed)
Dr. Lorelei Pont, [facility] on [address] called to say that chloroquine has been discontinued and wants to know what else you can call in for pt

## 2014-04-08 NOTE — Telephone Encounter (Signed)
Called the [facility].  They are having a hard time finding this medication.  Called and found it at [facility].  Called in rx there, LMOM with pt at home

## 2014-04-12 ENCOUNTER — Ambulatory Visit (INDEPENDENT_AMBULATORY_CARE_PROVIDER_SITE_OTHER): Payer: BC Managed Care – PPO | Admitting: Psychology

## 2014-04-12 DIAGNOSIS — IMO0002 Reserved for concepts with insufficient information to code with codable children: Secondary | ICD-10-CM

## 2014-04-12 DIAGNOSIS — F909 Attention-deficit hyperactivity disorder, unspecified type: Secondary | ICD-10-CM

## 2014-04-12 DIAGNOSIS — F902 Attention-deficit hyperactivity disorder, combined type: Secondary | ICD-10-CM

## 2014-04-12 NOTE — Progress Notes (Signed)
   THERAPIST PROGRESS NOTE  Session Time: 1.28pm-2.14pm  Participation Level: Active  Behavioral Response: Well GroomedAlertEuthymic  Type of Therapy: Individual Therapy  Treatment Goals addressed: Diagnosis: MDD, ADHD and goal1.  Interventions: Motivational Interviewing, Strength-based and Family Systems  Summary: Wendy Levy is a 18 y.o. female who presents with full and bright affect.  Pt reports she just received progress reports this week and has As, Bs 1C and 1 F.  Pt acknowledges lack of completing assignments has effected- however is working hard at completing makeup work for psychology and once complete will have a C.  Pt reported that she and mom have been communicating about and this has gone well.  Pt states that mom has been helpful in getting her to keep focus and come up w/ a plan w/out overwhelming her.    Suicidal/Homicidal: Nowithout intent/plan  Therapist Response: assessed pt current functioning per pt report.  Explored w/ pt her plan for completion of school requirements and encouraged pt on planning for retest for grad chords she wants.  Processed w/ pt parent-child interactions and how this has progressed and developed over this past year.  Explored w/ pt her summer plans, goals and steps towards meeting.   Plan: Return again in 2 weeks.  Diagnosis: Axis I: ADHD, combined type and MDD    Axis II: No diagnosis    YATES,LEANNE, LPC 04/12/2014

## 2014-04-13 ENCOUNTER — Other Ambulatory Visit (HOSPITAL_COMMUNITY): Payer: Self-pay | Admitting: Psychiatry

## 2014-04-13 NOTE — Telephone Encounter (Signed)
done

## 2014-04-23 ENCOUNTER — Ambulatory Visit (INDEPENDENT_AMBULATORY_CARE_PROVIDER_SITE_OTHER): Payer: BC Managed Care – PPO | Admitting: Psychology

## 2014-04-23 ENCOUNTER — Encounter (HOSPITAL_COMMUNITY): Payer: Self-pay | Admitting: Psychology

## 2014-04-23 DIAGNOSIS — F909 Attention-deficit hyperactivity disorder, unspecified type: Secondary | ICD-10-CM

## 2014-04-23 DIAGNOSIS — IMO0002 Reserved for concepts with insufficient information to code with codable children: Secondary | ICD-10-CM

## 2014-04-23 DIAGNOSIS — F902 Attention-deficit hyperactivity disorder, combined type: Secondary | ICD-10-CM

## 2014-04-23 NOTE — Progress Notes (Signed)
   THERAPIST PROGRESS NOTE  Session Time: 8.06am-8.40am  Participation Level: Active  Behavioral Response: Well GroomedAlertEuthymic  Type of Therapy: Individual Therapy  Treatment Goals addressed: Diagnosis: MDD, ADHD and goal 1.  Interventions: CBT and Supportive  Summary: Wendy Levy is a 18 y.o. female who presents with initially drowsy and reporting difficulty sleeping at night.  Pt became more alert as session continued.  Pt reports not going to bed to around 12pm- feels tired in day.  Pt denies any worries or stressors.  Pt reports mood is good.  Pt reports on track to successfully complete high school in 2 weeks and looking forward to graduation.  Pt discussed her summer plans.  Pt reports peer and family interactions positive.   Suicidal/Homicidal: Nowithout intent/plan  Therapist Response: Assessed pt current functioning per pt report.  Explored for any factors effecting sleep and discussed healthy sleep hygiene.  Explored w/pt goals she has met to make progress towards graduation.  Discussed summer activities and therapy frequency.  Plan: Return again in 2 weeks. F/u monthly during summer.   Diagnosis: Axis I: MDD and ADHD    Axis II: No diagnosis    Ellyson Rarick, [place] 04/23/2014

## 2014-05-08 ENCOUNTER — Ambulatory Visit (INDEPENDENT_AMBULATORY_CARE_PROVIDER_SITE_OTHER): Payer: BC Managed Care – PPO | Admitting: Psychology

## 2014-05-08 DIAGNOSIS — F909 Attention-deficit hyperactivity disorder, unspecified type: Secondary | ICD-10-CM

## 2014-05-08 DIAGNOSIS — F902 Attention-deficit hyperactivity disorder, combined type: Secondary | ICD-10-CM

## 2014-05-08 DIAGNOSIS — IMO0002 Reserved for concepts with insufficient information to code with codable children: Secondary | ICD-10-CM

## 2014-05-08 NOTE — Progress Notes (Signed)
   THERAPIST PROGRESS NOTE  Session Time: 8.03am-8.48am  Participation Level: Active  Behavioral Response: Well GroomedAlertAFFECT WNL  Type of Therapy: Individual Therapy  Treatment Goals addressed: Diagnosis: MDD, ADHD and goal 1.  Interventions: Strength-based and Psychosocial Skills: Conflict resolution  Summary: Saudia Smyser is a 18 y.o. female who presents with report of tired.   Pt reports some conflict w/ mom this morning as mom knee injured and wanted her to take responsibility for dog's walk this a.m. And pt not prepared.  Pt increased awareness that may need to assist mom w/ some of her responsibilities and work for improved communication.  Pt discussed graduation this weekend and some anxiety about passing one class as exam difficult. Pt discussed preparation needed for mission trip.  Mom informed need to finish [employer] orientation and placement testing this summer.   Suicidal/Homicidal: Nowithout intent/plan  Therapist Response: Assessed pt current functioning per pt and parent report.  Processed w/pt conflict this morning and assisted pt in exploring other's viewpoint and how to improve communication.  Explored w/pt progress made towards goal of graduated high school and pt anxiety.  Explored w/pt transition to summer.   Plan: Return again in 4 weeks.  Diagnosis: Axis I: ADHD, combined type and MDD    Axis II: No diagnosis    YATES,LEANNE, LPC 05/08/2014

## 2014-05-20 ENCOUNTER — Other Ambulatory Visit: Payer: Self-pay | Admitting: Physician Assistant

## 2014-05-28 ENCOUNTER — Ambulatory Visit (INDEPENDENT_AMBULATORY_CARE_PROVIDER_SITE_OTHER): Payer: BC Managed Care – PPO | Admitting: Psychiatry

## 2014-05-28 ENCOUNTER — Encounter (HOSPITAL_COMMUNITY): Payer: Self-pay | Admitting: Psychiatry

## 2014-05-28 VITALS — BP 69/55 | HR 95 | Ht 64.0 in | Wt 242.0 lb

## 2014-05-28 DIAGNOSIS — F909 Attention-deficit hyperactivity disorder, unspecified type: Secondary | ICD-10-CM

## 2014-05-28 DIAGNOSIS — F913 Oppositional defiant disorder: Secondary | ICD-10-CM

## 2014-05-28 DIAGNOSIS — F331 Major depressive disorder, recurrent, moderate: Secondary | ICD-10-CM

## 2014-05-28 DIAGNOSIS — F341 Dysthymic disorder: Secondary | ICD-10-CM

## 2014-05-28 MED ORDER — LISDEXAMFETAMINE DIMESYLATE 40 MG PO CAPS: 40.0000 mg | ORAL_CAPSULE | ORAL | Status: DC

## 2014-05-28 MED ORDER — FLUOXETINE HCL 20 MG PO CAPS: 20.0000 mg | ORAL_CAPSULE | Freq: Every day | ORAL | Status: DC

## 2014-05-28 NOTE — Progress Notes (Addendum)
   [facility] Follow-up Outpatient Visit  Wendy Levy Dec 10, 1995  Date:  05/28/14  Subjective: Pt is here for follow  Pt just returned from [place], has fx ankle on left foot. She went with a church group. She is obese, with orange hair. Sleeping and eating are fine. Mood is stable. She denies Si/hi/avh. Tolerating the medications. No somatic complaints. No changes. No oppositional defiant stuff. Concentration is fair. Rtc om 3 months.   There were no vitals filed for this visit.  Mental Status Examination  Appearance: casual Alert: Yes Attention: fair  Cooperative: Yes Eye Contact: Fair Speech: wdl  Psychomotor Activity: Normal Memory/Concentration: fair  Oriented: time/date and situation Mood: Anxious Affect: Appropriate Thought Processes and Associations: Circumstantial Fund of Knowledge: Fair Thought Content: preoccupations Insight: Fair Judgement: Fair  Diagnosis:  Adhd MDD, recurrent, moderate Dysthymic disorder ODD Treatment Plan:  Rtc in 3 months Therapy Fluoxetine 20 mg po daily for depression and therapy vyvanse 40 mg po for adhd  Madison Hickman, NP

## 2014-06-11 ENCOUNTER — Ambulatory Visit (INDEPENDENT_AMBULATORY_CARE_PROVIDER_SITE_OTHER): Payer: BC Managed Care – PPO | Admitting: Psychology

## 2014-06-11 DIAGNOSIS — IMO0002 Reserved for concepts with insufficient information to code with codable children: Secondary | ICD-10-CM

## 2014-06-11 DIAGNOSIS — F902 Attention-deficit hyperactivity disorder, combined type: Secondary | ICD-10-CM

## 2014-06-11 DIAGNOSIS — F909 Attention-deficit hyperactivity disorder, unspecified type: Secondary | ICD-10-CM

## 2014-06-11 NOTE — Progress Notes (Signed)
   THERAPIST PROGRESS NOTE  Session Time: 3.30pm-4:10pm  Participation Level: Active  Behavioral Response: Well GroomedAlertinitially irritable  Type of Therapy: Individual Therapy  Treatment Goals addressed: Diagnosis: ADHD, MDD And goal 1.   Interventions: CBT and Psychosocial Skills: effective communication  Summary: Wendy Levy is a 17 y.o. female who presents with initial irritable affect and withdrawn w/ mom in session.  Pt reports mom wanted to join session and pt agreed, but was guarded w/ mom in session.  Mom reported that she is looking for guidance on how to support pt w/out enabling her.  She discussed want for her to either do school or work or both to prepare for her independence, but feels that she has had to prompt her a lot.  Pt individually expressed that she doesn't feel motivated for school and wants to take a year off but feels that mom would be disappointed with her so has communicated her thoughts.  Initially pt stated that she didn't feel ready for a job either but agreed that w/out school she would need a job- even if not her future career.  Pt expressed that she feels more motivated for this and discussed her next steps towards securing a job.  Pt acknowledged need to communicate w/ mom and discussed pattern of getting mad if mom doesn't respond in way she wants and retreating to room.  Pt was able to identify how to use I messages for expressing her thoughts and feelings and not going in on the defensive . Pt also expressed concern that if didn't do school she would be judge at church.  Pt identified that talking w/ pastor would also be good for guidance.  Suicidal/Homicidal: Nowithout intent/plan  Therapist Response: Assessed pt current functioning per pt and parent report.  Attempted to open communication w/ pt and mom.  Met w/ pt individually processed w/pt expectations of moving forward w/ either a job or school.  Reiterated to pt need for steps towards  independence is further school or employment and discussed w/ pt next steps to take.  Encouraged pt to express her thoughts and feelings to mom and explored and identified effective communication w/ I statements.   Plan: Return again in 2 weeks.  Diagnosis: Axis I: ADHD, combined type and MDD    Axis II: No diagnosis    YATES,LEANNE, LPC 06/11/2014

## 2014-06-15 ENCOUNTER — Ambulatory Visit: Payer: BC Managed Care – PPO

## 2014-06-15 ENCOUNTER — Ambulatory Visit (INDEPENDENT_AMBULATORY_CARE_PROVIDER_SITE_OTHER): Payer: BC Managed Care – PPO | Admitting: Family Medicine

## 2014-06-15 VITALS — BP 122/80 | HR 115 | Temp 98.3°F | Resp 18 | Ht 65.0 in | Wt 245.0 lb

## 2014-06-15 DIAGNOSIS — M25579 Pain in unspecified ankle and joints of unspecified foot: Secondary | ICD-10-CM

## 2014-06-15 DIAGNOSIS — M25572 Pain in left ankle and joints of left foot: Secondary | ICD-10-CM

## 2014-06-15 DIAGNOSIS — S93409A Sprain of unspecified ligament of unspecified ankle, initial encounter: Secondary | ICD-10-CM

## 2014-06-15 DIAGNOSIS — S51801A Unspecified open wound of right forearm, initial encounter: Secondary | ICD-10-CM

## 2014-06-15 DIAGNOSIS — S51809A Unspecified open wound of unspecified forearm, initial encounter: Secondary | ICD-10-CM

## 2014-06-15 NOTE — Progress Notes (Signed)
Subjective:    Patient ID: Wendy Levy, female    DOB: 04/22/1996, 18 y.o.   MRN: [id number] This chart was scribed for Merri Ray, MD by Vernell Barrier, Medical Scribe. The patient was seen in room 12. This patient's care was started at 10:05 AM.   HPI HPI Comments: Wendy Levy is a 18 y.o. female who presents to the [facility] with multiple complaints. Report a left ankle injury occuring 6/23 while in [place] on a church mission trip. States she was walking on a platform going down and ankle rolled and she heard a crack. Did not seek medical attention. States pastor gave her a brace she wore regularly and physical therapist on the trip looked at the injury. Was told it was just a sprain. Did report some swelling at time of injury onset with brusing. Currently wearing a brace. Pain worse when walking long distances and with pressure. Worse with plantarflexion. Was performing several ROM exercises while in [place] to assist with healing.   Also reports a scrape on the right wrist occuring 8 days ago. Reports spotty bleeding that has since resolved and is now healing. States she occasionally picks at the scab. Tetanus UTD.  Patient Active Problem List   Diagnosis Date Noted  . Migraine, unspecified, without mention of intractable migraine without mention of status migrainosus 03/15/2013  . Major depressive disorder, recurrent episode, in partial or unspecified remission 01/31/2012  . ADHD (attention deficit hyperactivity disorder), combined type 11/02/2011  . ODD (oppositional defiant disorder) 11/02/2011  . Dysthymic disorder 11/02/2011   Past Medical History  Diagnosis Date  . ADHD (attention deficit hyperactivity disorder)   . Depression   . Oppositional defiant disorder   . Wrist fracture, bilateral   . Shoulder dislocation     bilateral   Past Surgical History  Procedure Laterality Date  . Wisdom tooth extraction  04/2013   No Known Allergies Prior  to Admission medications   Medication Sig Start Date End Date Taking? Authorizing Provider  cetirizine (ZYRTEC) 10 MG tablet Take 10 mg by mouth daily as needed for allergies.   Yes Historical Provider, MD  chloroquine (ARALEN) 500 MG tablet Take 1 tablet weekly, begin 2 weeks prior to travel and complete 4 weeks after travel. 04/08/14  Yes Gay Filler Copland, MD  FLUoxetine (PROZAC) 20 MG capsule Take 1 capsule (20 mg total) by mouth daily. 05/28/14  Yes Madison Hickman, NP  levonorgestrel-ethinyl estradiol (AVIANE,ALESSE,LESSINA) 0.1-20 MG-MCG tablet Take 1 tablet by mouth daily. 04/03/14  Yes Gay Filler Copland, MD  lisdexamfetamine (VYVANSE) 40 MG capsule Take 1 capsule (40 mg total) by mouth every morning. 05/28/14 08/27/14 Yes Meghan Blankmann, NP  MICROGESTIN FE 1.5/30 1.5-30 MG-MCG tablet TAKE 1 TABLET BY MOUTH DAILY    Mancel Bale, PA-C  typhoid (VIVOTIF BERNA VACCINE) DR capsule Take 1 capsule by mouth every other day. Take 4 doses. Complete a week prior to exposure 04/03/14   Darreld Mclean, MD   History   Social History  . Marital Status: Single    Spouse Name: n/a    Number of Children: 0  . Years of Education: N/A   Occupational History  . 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 Smoking status: Never Smoker   . Smokeless tobacco: Never Used  . Alcohol Use: No  . Drug Use: No  . Sexual Activity: Not Currently    Birth  Control/ Protection: Condom   Other Topics Concern  . Not on file   Social History Narrative   Lives with her mother (adopted).  Her sister lives in [place], [place].   Review of Systems  Musculoskeletal: Positive for arthralgias.  Skin: Positive for wound (right wrist).   Objective:  Physical Exam  Nursing note and vitals reviewed. Constitutional: She is oriented to person, place, and time. She appears well-developed and well-nourished. No distress.  HENT:  Head: Normocephalic and atraumatic.  Eyes:  Conjunctivae and EOM are normal.  Neck: Neck supple. No tracheal deviation present.  Cardiovascular: Normal rate.   Pulmonary/Chest: Effort normal. No respiratory distress.  Musculoskeletal: Normal range of motion.       Left ankle: She exhibits no swelling and no ecchymosis. Tenderness. Lateral malleolus (distal) tenderness found. No head of 5th metatarsal and no proximal fibula tenderness found.  LEFT ANKLE: Full ROM. Skin intact. No ecchymosis. No swelling. Slight tenderness over distal lateral malleolus and soft tissue just inferior to lateral malleolus. 5th metatarsal and navicular non tender. Negative squeeze. Laxity with talar tilt. Slight laxity with drawer. Negative Kleiger. Proxmial fibular non tender.   Neurological: She is alert and oriented to person, place, and time.  Skin: Skin is warm and dry.  3 cm healing wound liner on right wrist. 1-2 mm of erythema around central healing scab. No surrounding erythema, induration, or fluctuance. No discharge.   Psychiatric: She has a normal mood and affect. Her behavior is normal.   Filed Vitals:   06/15/14 0922  BP: 122/80  Pulse: 115  Temp: 98.3 F (36.8 C)  TempSrc: Oral  Resp: 18  Height: 5\' 5"  (1.651 m)  Weight: 245 lb (111.131 kg)  SpO2: 99%    [facility] reading (PRIMARY) by Dr. Carlota Raspberry: Xray left ankle: negative for fx or acute findings.   Assessment & Plan:   Wendy Levy is a 18 y.o. female Ankle pain, left - Plan: DG Ankle Complete Left, Sprain of ankle, unspecified site  - prior sprain, no fx.  Some deconditioning and prior sprains.  -HEP by handout, splint only as needed,  rtc precautions.  Wound, open, arm, forearm, right, initial encounter  - healing well.  Suspect some irritation with picking at scab. Advised to cover if needed, not pick at area.   -rtc precautions.   No orders of the defined types were placed in this encounter.   Patient Instructions  The wound on your arm appears irritated but not infected at  this time. If any increased redness or worsening can recheck that area. For your ankle, see the exercises. Wear the splint only as need. And if not improving in the next 2-4 weeks, can recheck to discuss further. Return to the clinic or go to the nearest emergency room if any of your symptoms worsen or new symptoms occur.   Acute Ankle Sprain with Phase I Rehab An acute ankle sprain is a partial or complete tear in one or more of the ligaments of the ankle due to traumatic injury. The severity of the injury depends on both the the number of ligaments sprained and the grade of sprain. There are 3 grades of sprains.   A grade 1 sprain is a mild sprain. There is a slight pull without obvious tearing. There is no loss of strength, and the muscle and ligament are the correct length.  A grade 2 sprain is a moderate sprain. There is tearing of fibers within the substance of the ligament where  it connects two bones or two cartilages. The length of the ligament is increased, and there is usually decreased strength.  A grade 3 sprain is a complete rupture of the ligament and is uncommon. In addition to the grade of sprain, there are three types of ankle sprains.  Lateral ankle sprains: This is a sprain of one or more of the three ligaments on the outer side (lateral) of the ankle. These are the most common sprains. Medial ankle sprains: There is one large triangular ligament of the inner side (medial) of the ankle that is susceptible to injury. Medial ankle sprains are less common. Syndesmosis, "high ankle," sprains: The syndesmosis is the ligament that connects the two bones of the lower leg. Syndesmosis sprains usually only occur with very severe ankle sprains. SYMPTOMS  Pain, tenderness, and swelling in the ankle, starting at the side of injury that may progress to the whole ankle and foot with time.  "Pop" or tearing sensation at the time of injury.  Bruising that may spread to the heel.  Impaired  ability to walk soon after injury. CAUSES   Acute ankle sprains are caused by trauma placed on the ankle that temporarily forces or pries the anklebone (talus) out of its normal socket.  Stretching or tearing of the ligaments that normally hold the joint in place (usually due to a twisting injury). RISK INCREASES WITH:  Previous ankle sprain.  Sports in which the foot may land awkwardly (ie. basketball, volleyball, or soccer) or walking or running on uneven or rough surfaces.  Shoes with inadequate support to prevent sideways motion when stress occurs.  Poor strength and flexibility.  Poor balance skills.  Contact sports. PREVENTION   Warm up and stretch properly before activity.  Maintain physical fitness:  Ankle and leg flexibility, muscle strength, and endurance.  Cardiovascular fitness.  Balance training activities.  Use proper technique and have a coach correct improper technique.  Taping, protective strapping, bracing, or high-top tennis shoes may help prevent injury. Initially, tape is best; however, it loses most of its support function within 10 to 15 minutes.  Wear proper fitted protective shoes (High-top shoes with taping or bracing is more effective than either alone).  Provide the ankle with support during sports and practice activities for 12 months following injury. PROGNOSIS   If treated properly, ankle sprains can be expected to recover completely; however, the length of recovery depends on the degree of injury.  A grade 1 sprain usually heals enough in 5 to 7 days to allow modified activity and requires an average of 6 weeks to heal completely.  A grade 2 sprain requires 6 to 10 weeks to heal completely.  A grade 3 sprain requires 12 to 16 weeks to heal.  A syndesmosis sprain often takes more than 3 months to heal. RELATED COMPLICATIONS   Frequent recurrence of symptoms may result in a chronic problem. Appropriately addressing the problem the first  time decreases the frequency of recurrence and optimizes healing time. Severity of the initial sprain does not predict the likelihood of later instability.  Injury to other structures (bone, cartilage, or tendon).  A chronically unstable or arthritic ankle joint is a possiblity with repeated sprains. TREATMENT Treatment initially involves the use of ice, medication, and compression bandages to help reduce pain and inflammation. Ankle sprains are usually immobilized in a walking cast or boot to allow for healing. Crutches may be recommended to reduce pressure on the injury. After immobilization, strengthening and stretching exercises may  be necessary to regain strength and a full range of motion. Surgery is rarely needed to treat ankle sprains. MEDICATION   Nonsteroidal anti-inflammatory medications, such as aspirin and ibuprofen (do not take for the first 3 days after injury or within 7 days before surgery), or other minor pain relievers, such as acetaminophen, are often recommended. Take these as directed by your caregiver. Contact your caregiver immediately if any bleeding, stomach upset, or signs of an allergic reaction occur from these medications.  Ointments applied to the skin may be helpful.  Pain relievers may be prescribed as necessary by your caregiver. Do not take prescription pain medication for longer than 4 to 7 days. Use only as directed and only as much as you need. HEAT AND COLD  Cold treatment (icing) is used to relieve pain and reduce inflammation for acute and chronic cases. Cold should be applied for 10 to 15 minutes every 2 to 3 hours for inflammation and pain and immediately after any activity that aggravates your symptoms. Use ice packs or an ice massage.  Heat treatment may be used before performing stretching and strengthening activities prescribed by your caregiver. Use a heat pack or a warm soak. SEEK IMMEDIATE MEDICAL CARE IF:   Pain, swelling, or bruising worsens  despite treatment.  You experience pain, numbness, discoloration, or coldness in the foot or toes.  New, unexplained symptoms develop (drugs used in treatment may produce side effects.) EXERCISES  PHASE I EXERCISES RANGE OF MOTION (ROM) AND STRETCHING EXERCISES - Ankle Sprain, Acute Phase I, Weeks 1 to 2 These exercises may help you when beginning to restore flexibility in your ankle. You will likely work on these exercises for the 1 to 2 weeks after your injury. Once your physician, physical therapist, or athletic trainer sees adequate progress, he or she will advance your exercises. While completing these exercises, remember:   Restoring tissue flexibility helps normal motion to return to the joints. This allows healthier, less painful movement and activity.  An effective stretch should be held for at least 30 seconds.  A stretch should never be painful. You should only feel a gentle lengthening or release in the stretched tissue. RANGE OF MOTION - Dorsi/Plantar Flexion  While sitting with your right / left knee straight, draw the top of your foot upwards by flexing your ankle. Then reverse the motion, pointing your toes downward.  Hold each position for __________ seconds.  After completing your first set of exercises, repeat this exercise with your knee bent. Repeat __________ times. Complete this exercise __________ times per day.  RANGE OF MOTION - Ankle Alphabet  Imagine your right / left big toe is a pen.  Keeping your hip and knee still, write out the entire alphabet with your "pen." Make the letters as large as you can without increasing any discomfort. Repeat __________ times. Complete this exercise __________ times per day.  STRENGTHENING EXERCISES - Ankle Sprain, Acute -Phase I, Weeks 1 to 2 These exercises may help you when beginning to restore strength in your ankle. You will likely work on these exercises for 1 to 2 weeks after your injury. Once your physician, physical  therapist, or athletic trainer sees adequate progress, he or she will advance your exercises. While completing these exercises, remember:   Muscles can gain both the endurance and the strength needed for everyday activities through controlled exercises.  Complete these exercises as instructed by your physician, physical therapist, or athletic trainer. Progress the resistance and repetitions only as guided.  You may experience muscle soreness or fatigue, but the pain or discomfort you are trying to eliminate should never worsen during these exercises. If this pain does worsen, stop and make certain you are following the directions exactly. If the pain is still present after adjustments, discontinue the exercise until you can discuss the trouble with your clinician. STRENGTH - Dorsiflexors  Secure a rubber exercise band/tubing to a fixed object (ie. table, pole) and loop the other end around your right / left foot.  Sit on the floor facing the fixed object. The band/tubing should be slightly tense when your foot is relaxed.  Slowly draw your foot back toward you using your ankle and toes.  Hold this position for __________ seconds. Slowly release the tension in the band and return your foot to the starting position. Repeat __________ times. Complete this exercise __________ times per day.  STRENGTH - Plantar-flexors   Sit with your right / left leg extended. Holding onto both ends of a rubber exercise band/tubing, loop it around the ball of your foot. Keep a slight tension in the band.  Slowly push your toes away from you, pointing them downward.  Hold this position for __________ seconds. Return slowly, controlling the tension in the band/tubing. Repeat __________ times. Complete this exercise __________ times per day.  STRENGTH - Ankle Eversion  Secure one end of a rubber exercise band/tubing to a fixed object (table, pole). Loop the other end around your foot just before your  toes.  Place your fists between your knees. This will focus your strengthening at your ankle.  Drawing the band/tubing across your opposite foot, slowly, pull your little toe out and up. Make sure the band/tubing is positioned to resist the entire motion.  Hold this position for __________ seconds. Have your muscles resist the band/tubing as it slowly pulls your foot back to the starting position.  Repeat __________ times. Complete this exercise __________ times per day.  STRENGTH - Ankle Inversion  Secure one end of a rubber exercise band/tubing to a fixed object (table, pole). Loop the other end around your foot just before your toes.  Place your fists between your knees. This will focus your strengthening at your ankle.  Slowly, pull your big toe up and in, making sure the band/tubing is positioned to resist the entire motion.  Hold this position for __________ seconds.  Have your muscles resist the band/tubing as it slowly pulls your foot back to the starting position. Repeat __________ times. Complete this exercises __________ times per day.  STRENGTH - Towel Curls  Sit in a chair positioned on a non-carpeted surface.  Place your right / left foot on a towel, keeping your heel on the floor.  Pull the towel toward your heel by only curling your toes. Keep your heel on the floor.  If instructed by your physician, physical therapist, or athletic trainer, add weight to the end of the towel. Repeat __________ times. Complete this exercise __________ times per day. Document Released: 06/16/2005 Document Revised: 02/07/2012 Document Reviewed: 02/27/2009 [facility] Patient Information 2015 [place], [place]. This information is not intended to replace advice given to you by your health care provider. Make sure you discuss any questions you have with your health care provider.      I personally performed the services described in this documentation, which was scribed in my presence. The  recorded information has been reviewed and is accurate.

## 2014-06-15 NOTE — Addendum Note (Signed)
Addended by: Merri Ray R on: 06/15/2014 11:59 AM   Modules accepted: Level of Service

## 2014-06-15 NOTE — Patient Instructions (Signed)
The wound on your arm appears irritated but not infected at this time. If any increased redness or worsening can recheck that area. For your ankle, see the exercises. Wear the splint only as need. And if not improving in the next 2-4 weeks, can recheck to discuss further. Return to the clinic or go to the nearest emergency room if any of your symptoms worsen or new symptoms occur.   Acute Ankle Sprain with Phase I Rehab An acute ankle sprain is a partial or complete tear in one or more of the ligaments of the ankle due to traumatic injury. The severity of the injury depends on both the the number of ligaments sprained and the grade of sprain. There are 3 grades of sprains.   A grade 1 sprain is a mild sprain. There is a slight pull without obvious tearing. There is no loss of strength, and the muscle and ligament are the correct length.  A grade 2 sprain is a moderate sprain. There is tearing of fibers within the substance of the ligament where it connects two bones or two cartilages. The length of the ligament is increased, and there is usually decreased strength.  A grade 3 sprain is a complete rupture of the ligament and is uncommon. In addition to the grade of sprain, there are three types of ankle sprains.  Lateral ankle sprains: This is a sprain of one or more of the three ligaments on the outer side (lateral) of the ankle. These are the most common sprains. Medial ankle sprains: There is one large triangular ligament of the inner side (medial) of the ankle that is susceptible to injury. Medial ankle sprains are less common. Syndesmosis, "high ankle," sprains: The syndesmosis is the ligament that connects the two bones of the lower leg. Syndesmosis sprains usually only occur with very severe ankle sprains. SYMPTOMS  Pain, tenderness, and swelling in the ankle, starting at the side of injury that may progress to the whole ankle and foot with time.  "Pop" or tearing sensation at the time of  injury.  Bruising that may spread to the heel.  Impaired ability to walk soon after injury. CAUSES   Acute ankle sprains are caused by trauma placed on the ankle that temporarily forces or pries the anklebone (talus) out of its normal socket.  Stretching or tearing of the ligaments that normally hold the joint in place (usually due to a twisting injury). RISK INCREASES WITH:  Previous ankle sprain.  Sports in which the foot may land awkwardly (ie. basketball, volleyball, or soccer) or walking or running on uneven or rough surfaces.  Shoes with inadequate support to prevent sideways motion when stress occurs.  Poor strength and flexibility.  Poor balance skills.  Contact sports. PREVENTION   Warm up and stretch properly before activity.  Maintain physical fitness:  Ankle and leg flexibility, muscle strength, and endurance.  Cardiovascular fitness.  Balance training activities.  Use proper technique and have a coach correct improper technique.  Taping, protective strapping, bracing, or high-top tennis shoes may help prevent injury. Initially, tape is best; however, it loses most of its support function within 10 to 15 minutes.  Wear proper fitted protective shoes (High-top shoes with taping or bracing is more effective than either alone).  Provide the ankle with support during sports and practice activities for 12 months following injury. PROGNOSIS   If treated properly, ankle sprains can be expected to recover completely; however, the length of recovery depends on the degree of  injury.  A grade 1 sprain usually heals enough in 5 to 7 days to allow modified activity and requires an average of 6 weeks to heal completely.  A grade 2 sprain requires 6 to 10 weeks to heal completely.  A grade 3 sprain requires 12 to 16 weeks to heal.  A syndesmosis sprain often takes more than 3 months to heal. RELATED COMPLICATIONS   Frequent recurrence of symptoms may result in a  chronic problem. Appropriately addressing the problem the first time decreases the frequency of recurrence and optimizes healing time. Severity of the initial sprain does not predict the likelihood of later instability.  Injury to other structures (bone, cartilage, or tendon).  A chronically unstable or arthritic ankle joint is a possiblity with repeated sprains. TREATMENT Treatment initially involves the use of ice, medication, and compression bandages to help reduce pain and inflammation. Ankle sprains are usually immobilized in a walking cast or boot to allow for healing. Crutches may be recommended to reduce pressure on the injury. After immobilization, strengthening and stretching exercises may be necessary to regain strength and a full range of motion. Surgery is rarely needed to treat ankle sprains. MEDICATION   Nonsteroidal anti-inflammatory medications, such as aspirin and ibuprofen (do not take for the first 3 days after injury or within 7 days before surgery), or other minor pain relievers, such as acetaminophen, are often recommended. Take these as directed by your caregiver. Contact your caregiver immediately if any bleeding, stomach upset, or signs of an allergic reaction occur from these medications.  Ointments applied to the skin may be helpful.  Pain relievers may be prescribed as necessary by your caregiver. Do not take prescription pain medication for longer than 4 to 7 days. Use only as directed and only as much as you need. HEAT AND COLD  Cold treatment (icing) is used to relieve pain and reduce inflammation for acute and chronic cases. Cold should be applied for 10 to 15 minutes every 2 to 3 hours for inflammation and pain and immediately after any activity that aggravates your symptoms. Use ice packs or an ice massage.  Heat treatment may be used before performing stretching and strengthening activities prescribed by your caregiver. Use a heat pack or a warm soak. SEEK  IMMEDIATE MEDICAL CARE IF:   Pain, swelling, or bruising worsens despite treatment.  You experience pain, numbness, discoloration, or coldness in the foot or toes.  New, unexplained symptoms develop (drugs used in treatment may produce side effects.) EXERCISES  PHASE I EXERCISES RANGE OF MOTION (ROM) AND STRETCHING EXERCISES - Ankle Sprain, Acute Phase I, Weeks 1 to 2 These exercises may help you when beginning to restore flexibility in your ankle. You will likely work on these exercises for the 1 to 2 weeks after your injury. Once your physician, physical therapist, or athletic trainer sees adequate progress, he or she will advance your exercises. While completing these exercises, remember:   Restoring tissue flexibility helps normal motion to return to the joints. This allows healthier, less painful movement and activity.  An effective stretch should be held for at least 30 seconds.  A stretch should never be painful. You should only feel a gentle lengthening or release in the stretched tissue. RANGE OF MOTION - Dorsi/Plantar Flexion  While sitting with your right / left knee straight, draw the top of your foot upwards by flexing your ankle. Then reverse the motion, pointing your toes downward.  Hold each position for __________ seconds.  After completing  your first set of exercises, repeat this exercise with your knee bent. Repeat __________ times. Complete this exercise __________ times per day.  RANGE OF MOTION - Ankle Alphabet  Imagine your right / left big toe is a pen.  Keeping your hip and knee still, write out the entire alphabet with your "pen." Make the letters as large as you can without increasing any discomfort. Repeat __________ times. Complete this exercise __________ times per day.  STRENGTHENING EXERCISES - Ankle Sprain, Acute -Phase I, Weeks 1 to 2 These exercises may help you when beginning to restore strength in your ankle. You will likely work on these exercises  for 1 to 2 weeks after your injury. Once your physician, physical therapist, or athletic trainer sees adequate progress, he or she will advance your exercises. While completing these exercises, remember:   Muscles can gain both the endurance and the strength needed for everyday activities through controlled exercises.  Complete these exercises as instructed by your physician, physical therapist, or athletic trainer. Progress the resistance and repetitions only as guided.  You may experience muscle soreness or fatigue, but the pain or discomfort you are trying to eliminate should never worsen during these exercises. If this pain does worsen, stop and make certain you are following the directions exactly. If the pain is still present after adjustments, discontinue the exercise until you can discuss the trouble with your clinician. STRENGTH - Dorsiflexors  Secure a rubber exercise band/tubing to a fixed object (ie. table, pole) and loop the other end around your right / left foot.  Sit on the floor facing the fixed object. The band/tubing should be slightly tense when your foot is relaxed.  Slowly draw your foot back toward you using your ankle and toes.  Hold this position for __________ seconds. Slowly release the tension in the band and return your foot to the starting position. Repeat __________ times. Complete this exercise __________ times per day.  STRENGTH - Plantar-flexors   Sit with your right / left leg extended. Holding onto both ends of a rubber exercise band/tubing, loop it around the ball of your foot. Keep a slight tension in the band.  Slowly push your toes away from you, pointing them downward.  Hold this position for __________ seconds. Return slowly, controlling the tension in the band/tubing. Repeat __________ times. Complete this exercise __________ times per day.  STRENGTH - Ankle Eversion  Secure one end of a rubber exercise band/tubing to a fixed object (table, pole).  Loop the other end around your foot just before your toes.  Place your fists between your knees. This will focus your strengthening at your ankle.  Drawing the band/tubing across your opposite foot, slowly, pull your little toe out and up. Make sure the band/tubing is positioned to resist the entire motion.  Hold this position for __________ seconds. Have your muscles resist the band/tubing as it slowly pulls your foot back to the starting position.  Repeat __________ times. Complete this exercise __________ times per day.  STRENGTH - Ankle Inversion  Secure one end of a rubber exercise band/tubing to a fixed object (table, pole). Loop the other end around your foot just before your toes.  Place your fists between your knees. This will focus your strengthening at your ankle.  Slowly, pull your big toe up and in, making sure the band/tubing is positioned to resist the entire motion.  Hold this position for __________ seconds.  Have your muscles resist the band/tubing as it slowly pulls  your foot back to the starting position. Repeat __________ times. Complete this exercises __________ times per day.  STRENGTH - Towel Curls  Sit in a chair positioned on a non-carpeted surface.  Place your right / left foot on a towel, keeping your heel on the floor.  Pull the towel toward your heel by only curling your toes. Keep your heel on the floor.  If instructed by your physician, physical therapist, or athletic trainer, add weight to the end of the towel. Repeat __________ times. Complete this exercise __________ times per day. Document Released: 06/16/2005 Document Revised: 02/07/2012 Document Reviewed: 02/27/2009 [facility] Patient Information 2015 [place], [place]. This information is not intended to replace advice given to you by your health care provider. Make sure you discuss any questions you have with your health care provider.

## 2014-06-17 ENCOUNTER — Encounter (HOSPITAL_COMMUNITY): Payer: Self-pay | Admitting: *Deleted

## 2014-06-17 NOTE — Progress Notes (Signed)
Vyvanse 40 mg authorized by [facility] Case ID # [id number] #Effective 05/23/14 thru 06/13/15 Pharmacy notified by fax 06/13/14

## 2014-06-19 ENCOUNTER — Telehealth: Payer: Self-pay

## 2014-06-19 NOTE — Telephone Encounter (Signed)
Mother LM on my VM returning [facility]YS' call to her regarding pt's radiology report. Please call mother back w/results and provider's instr's.

## 2014-06-20 ENCOUNTER — Telehealth: Payer: Self-pay | Admitting: Radiology

## 2014-06-20 NOTE — Telephone Encounter (Signed)
The patient's mother return my call, I informed her about Wendy Levy's x-ray results, advised to use the splints, to exercise and to return to clinic in few weeks, for follow up. Her mother stated that Wendy Levy is now in camp, talked to her today and she feel okay.

## 2014-06-27 ENCOUNTER — Ambulatory Visit (INDEPENDENT_AMBULATORY_CARE_PROVIDER_SITE_OTHER): Payer: BC Managed Care – PPO | Admitting: Family Medicine

## 2014-06-27 VITALS — BP 118/80 | HR 106 | Temp 98.6°F | Resp 18 | Ht 64.5 in | Wt 248.0 lb

## 2014-06-27 DIAGNOSIS — S92152A Displaced avulsion fracture (chip fracture) of left talus, initial encounter for closed fracture: Secondary | ICD-10-CM

## 2014-06-27 DIAGNOSIS — M25572 Pain in left ankle and joints of left foot: Secondary | ICD-10-CM

## 2014-06-27 DIAGNOSIS — S92109A Unspecified fracture of unspecified talus, initial encounter for closed fracture: Secondary | ICD-10-CM

## 2014-06-27 DIAGNOSIS — M25579 Pain in unspecified ankle and joints of unspecified foot: Secondary | ICD-10-CM

## 2014-06-27 NOTE — Patient Instructions (Signed)
Wear walking boot except for 2-3 times per day for range of motion at ankle as discussed.  Continue Ibuprofen as needed.  Recheck with me in 10 days to determine if ready for physical therapy, sooner if worse.

## 2014-06-27 NOTE — Progress Notes (Signed)
Subjective:    Patient ID: Wendy Levy, female    DOB: Aug 21, 1996, 18 y.o.   MRN: [id number]  HPI Wendy Levy is a 18 y.o. female  Here for follow up of L ankle injury - initially occuring 05/21/14 while in [place] on a church mission trip, seen by me 06/15/14.  Wore brace initially prior to ov. Hx of sprains prior. Treated as recurrent ankle sprain with brace and HEP as tolerated.XR report reviewed form radiologist - IMPRESSION:  Small bony fragment superior to the talar neck on the lateral view  concerning for a subacute avulsion fracture at the anterior capsular  attachment. Continued in brace and here for follow up.   Still hurts to walk on uneven ground and long distances. Min soreness with weightbearing. Unable to tell what area is sore.  Did walk more last week [place] at [place] last week. Wore brace all the time, but velcro wasn't working - now using neoprene wrap past few days. More sore now, but thinks may be in part from over walking. Few exercises - confused by how to do these.     tx ibuprofen    Patient Active Problem List   Diagnosis Date Noted  . Migraine, unspecified, without mention of intractable migraine without mention of status migrainosus 03/15/2013  . Major depressive disorder, recurrent episode, in partial or unspecified remission 01/31/2012  . ADHD (attention deficit hyperactivity disorder), combined type 11/02/2011  . ODD (oppositional defiant disorder) 11/02/2011  . Dysthymic disorder 11/02/2011   Past Medical History  Diagnosis Date  . ADHD (attention deficit hyperactivity disorder)   . Depression   . Oppositional defiant disorder   . Wrist fracture, bilateral   . Shoulder dislocation     bilateral   Past Surgical History  Procedure Laterality Date  . Wisdom tooth extraction  04/2013   No Known Allergies Prior to Admission medications   Medication Sig Start Date End Date Taking? Authorizing Provider  cetirizine (ZYRTEC) 10 MG  tablet Take 10 mg by mouth daily as needed for allergies.   Yes Historical Provider, MD  FLUoxetine (PROZAC) 20 MG capsule Take 1 capsule (20 mg total) by mouth daily. 05/28/14  Yes Madison Hickman, NP  levonorgestrel-ethinyl estradiol (AVIANE,ALESSE,LESSINA) 0.1-20 MG-MCG tablet Take 1 tablet by mouth daily. 04/03/14  Yes Gay Filler Copland, MD  lisdexamfetamine (VYVANSE) 40 MG capsule Take 1 capsule (40 mg total) by mouth every morning. 05/28/14 08/27/14 Yes Meghan Blankmann, NP  MICROGESTIN FE 1.5/30 1.5-30 MG-MCG tablet TAKE 1 TABLET BY MOUTH DAILY   Yes Mancel Bale, PA-C  chloroquine (ARALEN) 500 MG tablet Take 1 tablet weekly, begin 2 weeks prior to travel and complete 4 weeks after travel. 04/08/14   Gay Filler Copland, MD  typhoid (VIVOTIF BERNA VACCINE) DR capsule Take 1 capsule by mouth every other day. Take 4 doses. Complete a week prior to exposure 04/03/14   Darreld Mclean, MD   History   Social History  . Marital Status: Single    Spouse Name: n/a    Number of Children: 0  . Years of Education: N/A   Occupational History  . 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 Smoking status: Never Smoker   . Smokeless tobacco: Never Used  . Alcohol Use: No  . Drug Use: No  . Sexual Activity: Not Currently    Birth Control/ Protection: Condom   Other Topics Concern  .  Not on file   Social History Narrative   Lives with her mother (adopted).  Her sister lives in [place], [place].      Review of Systems  Musculoskeletal: Positive for arthralgias. Negative for gait problem.  Skin: Negative for rash and wound.       Objective:   Physical Exam  Constitutional: She is oriented to person, place, and time. She appears well-developed and well-nourished. No distress.  Pulmonary/Chest: Effort normal.  Musculoskeletal:       Left ankle: She exhibits decreased range of motion (min decr dorsiflex, inversion. ). She exhibits no swelling and no  ecchymosis. Tenderness. Achilles tendon normal. Achilles tendon exhibits no pain, no defect and normal Thompson's test results.       Feet:  Neurological: She is alert and oriented to person, place, and time.   Filed Vitals:   06/27/14 1125  BP: 118/80  Pulse: 106  Temp: 98.6 F (37 C)  TempSrc: Oral  Resp: 18  Height: 5' 4.5" (1.638 m)  Weight: 248 lb (112.492 kg)  SpO2: 99%      Assessment & Plan:  Wendy Levy is a 18 y.o. female Avulsion fracture of talus, left, closed, initial encounter  Pain in joint, ankle and foot, left  Small talar avulsion on XR, suspected lateral sprain component. Increased pain in otc brace/wrap.  Will immobilize for a short course in [place] - recheck in 10 days and anticipate transition to PT.  Sx care with tylenol or nsaid if needed. rtc precautions.    No orders of the defined types were placed in this encounter.   Patient Instructions  Wear walking boot except for 2-3 times per day for range of motion at ankle as discussed.  Continue Ibuprofen as needed.  Recheck with me in 10 days to determine if ready for physical therapy, sooner if worse.

## 2014-07-06 ENCOUNTER — Ambulatory Visit (INDEPENDENT_AMBULATORY_CARE_PROVIDER_SITE_OTHER): Payer: BC Managed Care – PPO | Admitting: Family Medicine

## 2014-07-06 VITALS — BP 120/72 | HR 102 | Temp 98.6°F | Resp 18 | Ht 65.0 in | Wt 245.0 lb

## 2014-07-06 DIAGNOSIS — S93409A Sprain of unspecified ligament of unspecified ankle, initial encounter: Secondary | ICD-10-CM

## 2014-07-06 DIAGNOSIS — L255 Unspecified contact dermatitis due to plants, except food: Secondary | ICD-10-CM

## 2014-07-06 DIAGNOSIS — L237 Allergic contact dermatitis due to plants, except food: Secondary | ICD-10-CM

## 2014-07-06 DIAGNOSIS — S93402D Sprain of unspecified ligament of left ankle, subsequent encounter: Secondary | ICD-10-CM

## 2014-07-06 DIAGNOSIS — Z5189 Encounter for other specified aftercare: Secondary | ICD-10-CM

## 2014-07-06 MED ORDER — TRIAMCINOLONE ACETONIDE 0.1 % EX CREA: 1.0000 "application " | TOPICAL_CREAM | Freq: Two times a day (BID) | CUTANEOUS | Status: DC

## 2014-07-06 NOTE — Patient Instructions (Signed)
Ankle exercises as discussed, wrap brace or if needed walking boot, and we will refer you to physical therapy. Recheck with Dr. Carlota Raspberry in 3 weeks.   For poison ivy - Ivy Dry or calamine, and steroid cream twice per day as needed.   Poison [employer] ivy is a inflammation of the skin (contact dermatitis) caused by touching the allergens on the leaves of the ivy plant following previous exposure to the plant. The rash usually appears 48 hours after exposure. The rash is usually bumps (papules) or blisters (vesicles) in a linear pattern. Depending on your own sensitivity, the rash may simply cause redness and itching, or it may also progress to blisters which may break open. These must be well cared for to prevent secondary bacterial (germ) infection, followed by scarring. Keep any open areas dry, clean, dressed, and covered with an antibacterial ointment if needed. The eyes may also get puffy. The puffiness is worst in the morning and gets better as the day progresses. This dermatitis usually heals without scarring, within 2 to 3 weeks without treatment. HOME CARE INSTRUCTIONS  Thoroughly wash with soap and water as soon as you have been exposed to poison ivy. You have about one half hour to remove the plant resin before it will cause the rash. This washing will destroy the oil or antigen on the skin that is causing, or will cause, the rash. Be sure to wash under your fingernails as any plant resin there will continue to spread the rash. Do not rub skin vigorously when washing affected area. Poison ivy cannot spread if no oil from the plant remains on your body. A rash that has progressed to weeping sores will not spread the rash unless you have not washed thoroughly. It is also important to wash any clothes you have been wearing as these may carry active allergens. The rash will return if you wear the unwashed clothing, even several days later. Avoidance of the plant in the future is the best measure. Poison  ivy plant can be recognized by the number of leaves. Generally, poison ivy has three leaves with flowering branches on a single stem. Diphenhydramine may be purchased over the counter and used as needed for itching. Do not drive with this medication if it makes you drowsy.Ask your caregiver about medication for children. SEEK MEDICAL CARE IF:  Open sores develop.  Redness spreads beyond area of rash.  You notice purulent (pus-like) discharge.  You have increased pain.  Other signs of infection develop (such as fever). Document Released: 11/12/2000 Document Revised: 02/07/2012 Document Reviewed: 04/25/2009 [facility] Patient Information 2015 [place], [place]. This information is not intended to replace advice given to you by your health care provider. Make sure you discuss any questions you have with your health care provider.

## 2014-07-06 NOTE — Progress Notes (Signed)
Subjective:    Patient ID: Wendy Levy, female    DOB: 1996/10/16, 18 y.o.   MRN: [id number]  HPI Wendy Levy is a 18 y.o. female  Here for follow up L ankle injury - initially occuring 05/21/14 while in [place] on a church mission trip, seen by me 06/15/14.  Wore brace initially prior to ov. Hx of sprains prior. Treated as recurrent ankle sprain with brace and HEP as tolerated.XR report reviewed form radiologist - IMPRESSION:  Small bony fragment superior to the talar neck on the lateral view concerning for a subacute avulsion fracture at the anterior capsular  Attachment.  Still hurt to walk on uneven ground and long distances.  Min soreness with weightbearing last ov.  Unable to tell what area is sore then  Did walk more that prior week [place] at [facility],  min exercises prior to last ov. Small talar avulsion on XR, suspected lateral sprain component. Increased pain in otc brace/wrap.  Placed in [employer] for short course with plan on transition to PT.   Here for follow up. L ankle feels a little better. Wearing walking boot. Has been doing range of motion exercises. Walks around house without boot - ok except sore to lift up ankle walking up stairs. Only needed ibuprofen once.   Additionally in back yard/patio few days ago.  Noticed rash on R ankle, L ankle and behind L knee - about 4 days ago. No face or genital lesions.  Tx: steroid cream - unknown name, nightly.     Patient Active Problem List   Diagnosis Date Noted  . Migraine, unspecified, without mention of intractable migraine without mention of status migrainosus 03/15/2013  . Major depressive disorder, recurrent episode, in partial or unspecified remission 01/31/2012  . ADHD (attention deficit hyperactivity disorder), combined type 11/02/2011  . ODD (oppositional defiant disorder) 11/02/2011  . Dysthymic disorder 11/02/2011   Past Medical History  Diagnosis Date  . ADHD (attention deficit hyperactivity  disorder)   . Depression   . Oppositional defiant disorder   . Wrist fracture, bilateral   . Shoulder dislocation     bilateral   Past Surgical History  Procedure Laterality Date  . Wisdom tooth extraction  04/2013   No Known Allergies Prior to Admission medications   Medication Sig Start Date End Date Taking? Authorizing Provider  cetirizine (ZYRTEC) 10 MG tablet Take 10 mg by mouth daily as needed for allergies.   Yes Historical Provider, MD  FLUoxetine (PROZAC) 20 MG capsule Take 1 capsule (20 mg total) by mouth daily. 05/28/14  Yes Madison Hickman, NP  levonorgestrel-ethinyl estradiol (AVIANE,ALESSE,LESSINA) 0.1-20 MG-MCG tablet Take 1 tablet by mouth daily. 04/03/14  Yes Gay Filler Copland, MD  lisdexamfetamine (VYVANSE) 40 MG capsule Take 1 capsule (40 mg total) by mouth every morning. 05/28/14 08/27/14 Yes Meghan Blankmann, NP  chloroquine (ARALEN) 500 MG tablet Take 1 tablet weekly, begin 2 weeks prior to travel and complete 4 weeks after travel. 04/08/14   Gay Filler Copland, MD  MICROGESTIN FE 1.5/30 1.5-30 MG-MCG tablet TAKE 1 TABLET BY MOUTH DAILY    Mancel Bale, PA-C  typhoid (VIVOTIF BERNA VACCINE) DR capsule Take 1 capsule by mouth every other day. Take 4 doses. Complete a week prior to exposure 04/03/14   Darreld Mclean, MD   History   Social History  . Marital Status: Single    Spouse Name: n/a    Number of Children: 0  . Years of Education: N/A  Occupational History  . 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 Smoking status: Never Smoker   . Smokeless tobacco: Never Used  . Alcohol Use: No  . Drug Use: No  . Sexual Activity: Not Currently    Birth Control/ Protection: Condom   Other Topics Concern  . Not on file   Social History Narrative   Lives with her mother (adopted).  Her sister lives in [place], [place].       Review of Systems  HENT: Negative for mouth sores.   Genitourinary: Negative for genital  sores.  Musculoskeletal: Positive for arthralgias. Negative for joint swelling.  Skin: Positive for rash.       Objective:   Physical Exam  Vitals reviewed. Constitutional: She is oriented to person, place, and time. She appears well-developed and well-nourished. No distress.  Pulmonary/Chest: Effort normal.  Musculoskeletal:       Left ankle: She exhibits decreased range of motion (mininal decr inversion, dorsiflexion.   negative kleiger. slight talar tilt and drawer laxity. no bony ttp). She exhibits no swelling and no ecchymosis. No tenderness. No lateral malleolus, no medial malleolus, no head of 5th metatarsal (no navicular ttp. ) and no proximal fibula tenderness found. Achilles tendon normal. Achilles tendon exhibits no pain (describes tightness in achiiles, but no ttp on exam, no defect. ), no defect and normal Thompson's test results.       Legs:      Feet:  Few scattered erythematous, linear rashes - R and L ankles and few erythematous papules and linear areas behind L knee.   Neurological: She is alert and oriented to person, place, and time.  Skin: Skin is warm and dry. Rash (see MSk exam. ) noted.   Filed Vitals:   07/06/14 1057  BP: 120/72  Pulse: 102  Temp: 98.6 F (37 C)  TempSrc: Oral  Resp: 18  Height: 5\' 5"  (1.651 m)  Weight: 245 lb (111.131 kg)  SpO2: 98%       Assessment & Plan:   [name] is a 18 y.o. female Ankle sprain, left, subsequent encounter - Plan: Ambulatory referral to Physical Therapy  -recurrent sprain and possible small subacute talar avulsion fragment. Improving.   -refer to PT, as recurrent.  -transitioned to sweedo/lace up brace.  Can use this or if incr pain - return to cam walker.   -cont rom and incr HEP to other exercises as in prior handout.   -recheck in 3weeks.   Poison ivy - Plan: triamcinolone cream (KENALOG) 0.1 %  -topical tx with TAC 0.1%BID prn, IVY dry or calamine, rtc precautions if spreads.   Meds ordered this  encounter  Medications  . triamcinolone cream (KENALOG) 0.1 %    Sig: Apply 1 application topically 2 (two) times daily.    Dispense:  30 g    Refill:  0   Patient Instructions  Ankle exercises as discussed, wrap brace or if needed walking boot, and we will refer you to physical therapy. Recheck with Dr. Carlota Raspberry in 3 weeks.   For poison ivy - Ivy Dry or calamine, and steroid cream twice per day as needed.   Poison [employer] ivy is a inflammation of the skin (contact dermatitis) caused by touching the allergens on the leaves of the ivy plant following previous exposure to the plant. The rash usually appears 48 hours after exposure. The rash is usually bumps (papules) or blisters (vesicles)  in a linear pattern. Depending on your own sensitivity, the rash may simply cause redness and itching, or it may also progress to blisters which may break open. These must be well cared for to prevent secondary bacterial (germ) infection, followed by scarring. Keep any open areas dry, clean, dressed, and covered with an antibacterial ointment if needed. The eyes may also get puffy. The puffiness is worst in the morning and gets better as the day progresses. This dermatitis usually heals without scarring, within 2 to 3 weeks without treatment. HOME CARE INSTRUCTIONS  Thoroughly wash with soap and water as soon as you have been exposed to poison ivy. You have about one half hour to remove the plant resin before it will cause the rash. This washing will destroy the oil or antigen on the skin that is causing, or will cause, the rash. Be sure to wash under your fingernails as any plant resin there will continue to spread the rash. Do not rub skin vigorously when washing affected area. Poison ivy cannot spread if no oil from the plant remains on your body. A rash that has progressed to weeping sores will not spread the rash unless you have not washed thoroughly. It is also important to wash any clothes you have been  wearing as these may carry active allergens. The rash will return if you wear the unwashed clothing, even several days later. Avoidance of the plant in the future is the best measure. Poison ivy plant can be recognized by the number of leaves. Generally, poison ivy has three leaves with flowering branches on a single stem. Diphenhydramine may be purchased over the counter and used as needed for itching. Do not drive with this medication if it makes you drowsy.Ask your caregiver about medication for children. SEEK MEDICAL CARE IF:  Open sores develop.  Redness spreads beyond area of rash.  You notice purulent (pus-like) discharge.  You have increased pain.  Other signs of infection develop (such as fever). Document Released: 11/12/2000 Document Revised: 02/07/2012 Document Reviewed: 04/25/2009 [facility] Patient Information 2015 [place], [place]. This information is not intended to replace advice given to you by your health care provider. Make sure you discuss any questions you have with your health care provider.

## 2014-07-12 ENCOUNTER — Ambulatory Visit (INDEPENDENT_AMBULATORY_CARE_PROVIDER_SITE_OTHER): Payer: BC Managed Care – PPO | Admitting: Psychology

## 2014-07-12 DIAGNOSIS — IMO0002 Reserved for concepts with insufficient information to code with codable children: Secondary | ICD-10-CM

## 2014-07-12 DIAGNOSIS — F909 Attention-deficit hyperactivity disorder, unspecified type: Secondary | ICD-10-CM

## 2014-07-12 DIAGNOSIS — F902 Attention-deficit hyperactivity disorder, combined type: Secondary | ICD-10-CM

## 2014-07-12 NOTE — Progress Notes (Signed)
   THERAPIST PROGRESS NOTE  Session Time: 2.30pm-3.10pm  Participation Level: Active  Behavioral Response: Well GroomedAlertdefensive  Type of Therapy: Individual Therapy  Treatment Goals addressed: Diagnosis: ADHD, MDD and goal 1.  Interventions: CBT and Supportive  Summary: Wendy Levy is a 18 y.o. female who presents with report of tired as spent night at friends. Pt also presented defensive by note mom wrote. Mom wrote brief note to counselor informing that her friend will be bringing her to tx sessions.  She also informed that might be beneficial to talk w/ pt about boundaries w/ her friend.  Pt reported that mom wanted her to "get out" and be socially with friends but now is annoyed that she wants her to be at home as well.  Pt reports she has known this friend since the 6th grade, but did have falling out w/ friend for about 2 years b/c friend's boyfriend "took her away" from her.  Pt reported just reconciled in past couple of months. Pt feels that her friendship is healthy. Pt did acknowledge that friend can be clingy and at times need to tell her needs time to self.  Pt doesn't feel friend is taking advantage of her and states "if anything I do b/c she drives me everywhere".  Pt was able to increase awareness that mom's concern doesn't mean she is doing something wrong- but wants to support last friendship.  Pt reports she begins [employer] next week part time taking M-F Eng 5:46-2:70JJ, Catering manager at [employer] expresses she is nervous starting something new, but also states possibility of meeting new people.   Suicidal/Homicidal: Nowithout intent/plan  Therapist Response: Assessed pt current functioning per pt and report parent provided.  Explored w/ pt relationship w/ friend and discussed dynamics.  Encourage pt to be assertive w/ needs for own time and also discussed pt not using friend's privilege of transportation.  Discussed upcoming transition to [employer] and discussed w/pt  feelings- validating and normalizing.  Encouraged pt to seek campus services if needed for academic success.    Plan: Return again in 2-3 weeks.  Diagnosis: Axis I: ADHD, combined type and MDD    Axis II: No diagnosis    Dieter Hane, LPC 07/12/2014

## 2014-07-31 ENCOUNTER — Ambulatory Visit (HOSPITAL_COMMUNITY): Payer: Self-pay | Admitting: Psychology

## 2014-08-21 ENCOUNTER — Ambulatory Visit (INDEPENDENT_AMBULATORY_CARE_PROVIDER_SITE_OTHER): Payer: BC Managed Care – PPO | Admitting: Psychology

## 2014-08-21 DIAGNOSIS — F909 Attention-deficit hyperactivity disorder, unspecified type: Secondary | ICD-10-CM

## 2014-08-21 DIAGNOSIS — F339 Major depressive disorder, recurrent, unspecified: Secondary | ICD-10-CM

## 2014-08-21 DIAGNOSIS — F902 Attention-deficit hyperactivity disorder, combined type: Secondary | ICD-10-CM

## 2014-08-21 NOTE — Progress Notes (Signed)
   THERAPIST PROGRESS NOTE  Session Time: 9.55am-10.40am  Participation Level: Active  Behavioral Response: Well GroomedAlert, AFFECT congruent w/ report of cold  Type of Therapy: Family Therapy  Treatment Goals addressed: Diagnosis: ADHD, MDD and goal 1.  Interventions: Counsellor, Psychosocial Skills: Chief Technology Officer and Supportive  Summary: Wendy Levy is a 18 y.o. female who presents with report of having a head cold and not feeling well.  Pt was joined by mom for family session for support.  Mom and pt discussed relationship w/ friend who has poor boundaries and difficulty accepting no from pt and very demanding of her constant attention.  Pt reported on how friend placed her in 2 predicaments attempting to manipulate w/ threats or risky behaviors. Pt discussed how she sought support of mom and mom discussed how pt making good choices w/ how approaching.  Pt discussed that she is setting boundaries and how to continue communicate this and aware that not responsible for how friend feels. Mom also encouraged pt building other friendships as well. Pt also discussed her struggles w/ courses at [employer] and initial very mad when mom checked on her online grading "behind her back" but now aware that mom was helping her.  Pt discussed that she struggles w/ keeping accountable with work on her classes and that struggles w/ writing assignments.  Pt had sought help from professor w/out good response and now sent communication to advisor for recommendations.  Mom was supportive of pt and what approaches she could take to situation.    Suicidal/Homicidal: Nowithout intent/plan  Therapist Response: Assessed pt current functioning per pt and parent report.  Processed w/ pt interactions w/ friend and discussed how pt is setting boundaries and strengths of pt response.  Discussed impact having on pt and need for self care and building other friendships.  Discussed w/ pt struggles  academically and steps to take in making decision re: one class withdraw or not.   Plan: Return again in 2 weeks.  Diagnosis: Axis I: ADHD, combined type and MDD    Axis II: No diagnosis    YATES,LEANNE, LPC 08/21/2014

## 2014-08-28 ENCOUNTER — Ambulatory Visit (HOSPITAL_COMMUNITY): Payer: Self-pay | Admitting: Psychiatry

## 2014-09-11 ENCOUNTER — Ambulatory Visit (INDEPENDENT_AMBULATORY_CARE_PROVIDER_SITE_OTHER): Payer: BC Managed Care – PPO | Admitting: Psychology

## 2014-09-11 DIAGNOSIS — F902 Attention-deficit hyperactivity disorder, combined type: Secondary | ICD-10-CM

## 2014-09-11 DIAGNOSIS — F3341 Major depressive disorder, recurrent, in partial remission: Secondary | ICD-10-CM

## 2014-09-11 NOTE — Progress Notes (Signed)
   THERAPIST PROGRESS NOTE  Session Time: 9.05am-10am  Participation Level: Active  Behavioral Response: Well GroomedAlertAFFECT WNL  Type of Therapy: Individual Therapy  Treatment Goals addressed: Diagnosis: ADHD, MDD and goal 1.  Interventions: CBT, Solution Focused and Strength-based  Summary: Wendy Levy is a 18 y.o. female who presents with full and bright affect.  Pt reported that she dropped a class a [employer] after talking w/ profressor and receiving recommendation to.  Pt reports feels sense of relief.  Pt reports she passed her English class and has to finish up her juvenile Justice class.  Pt reports currently high failing grade but believes she is working hard and can bring up her grade.  Pt was able to identify not completing online portion of assignments on time effecting grade.  Pt doesn't want to seek any campus tutoring support.  Pt reports that she has good energy is engaged w/ campus ministries and overall mood good.  Pt does report at night- just prior to bed begins thinking about day and more prone to feeling anxious and depressed at those times.  Pt was able to identify cognitive distortions and how to challenge with positives that have occurred in day and reminder of supports cared for.   Suicidal/Homicidal: Nowithout intent/plan  Therapist Response: Assessed pt current functioning per pt report.  Processed w/pt her stressors and how coping.  Had pt identify problem areas at school and steps can and will take to improve.  Explored w/pt ruminating thoughts that arise at night.  Discussed pattern of negative thinking at these times- distortions and how to challenge.  Updated plan w/ pt input.   Plan: Return again in 2 weeks.  Diagnosis: Axis I: ADHD, combined type and MDD    Axis II: No diagnosis    Wendy Levy, [place] 09/11/2014

## 2014-09-19 ENCOUNTER — Ambulatory Visit (INDEPENDENT_AMBULATORY_CARE_PROVIDER_SITE_OTHER): Payer: BC Managed Care – PPO | Admitting: Psychiatry

## 2014-09-19 ENCOUNTER — Encounter (HOSPITAL_COMMUNITY): Payer: Self-pay | Admitting: Psychiatry

## 2014-09-19 VITALS — BP 111/53 | HR 83 | Ht 64.0 in | Wt 249.8 lb

## 2014-09-19 DIAGNOSIS — F902 Attention-deficit hyperactivity disorder, combined type: Secondary | ICD-10-CM

## 2014-09-19 DIAGNOSIS — F913 Oppositional defiant disorder: Secondary | ICD-10-CM

## 2014-09-19 DIAGNOSIS — F341 Dysthymic disorder: Secondary | ICD-10-CM

## 2014-09-19 MED ORDER — LISDEXAMFETAMINE DIMESYLATE 10 MG PO CAPS: 10.0000 mg | ORAL_CAPSULE | Freq: Every day | ORAL | Status: DC

## 2014-09-19 NOTE — Progress Notes (Signed)
Wendy Levy [phone] Progress Note  Wendy Levy [id number] 18 y.o.   Chief Complaint: "all right"  History of Present Illness: Patient is a 18 year old diagnosed with ADHD combined type, MDD and ODD who presents today for a followup visit with her mother.  Pt states her concentration is poor since mid August  (start of college). She takes classes on Tuesday. The effect wears off by 3pm and she has no desire to do anything besides sleep or play on her computer. Pt is completing her assignments, sometimes not following the direction and doesn't turn in the assignments. Pt is not studying for exams. The other days of the week she avoids school work and is sleeping late.   Pt likes the lectures but feels overwhelmed with her course work.   Pt is making friends. Has one friendship that is complicated but pt is addressing it in therapy.   Reports she gets depressed when she is alone and has nothing to do. On those days she has low motivation and little interest in activities. Denies depression. On days she is busy and with friends pt does well. Pt has not yet learned to drive or gotten her drivers permit.   With a lot of tests she feels a lot of anxiety and fear. Pt tries to focus on the positive before exams.   Pt would like to find a job.   Mom states pt does better when she likes something. Once discouraged then pt doesn't try.   Suicidal Ideation: No Plan Formed: No Patient has means to carry out plan: No  Homicidal Ideation: No Plan Formed: No Patient has means to carry out plan: No  Review of Systems:  Review of Systems  Constitution: Negative. Negative for decreased appetite, fever, weakness and weight gain.  HENT: Negative.  Negative for headaches.   Cardiovascular: Negative.  Negative for chest pain and palpitations.  Respiratory: Negative.   Endocrine: Negative.   Hematologic/Lymphatic: Negative.   Skin: Negative.   Musculoskeletal: Negative.   Gastrointestinal:  Negative.   Psychiatric/Behavioral: Negative.       Past Medical Family, Social History: Patient lives with her adoptive mom. Patient at [facility].  reports that she has never smoked. She has never used smokeless tobacco. She reports that she does not drink alcohol or use illicit drugs.  Family History  Problem Relation Age of Onset  . Adopted: Yes  . Depression Sister     reportedly in and out of treatment facilities, also dx w/ RAD  . Suicidality Sister     Past Medical History  Diagnosis Date  . ADHD (attention deficit hyperactivity disorder)   . Depression   . Oppositional defiant disorder   . Wrist fracture, bilateral   . Shoulder dislocation     bilateral     Outpatient Encounter Prescriptions as of 09/19/2014  Medication Sig  . cetirizine (ZYRTEC) 10 MG tablet Take 10 mg by mouth daily as needed for allergies.  Marland Kitchen FLUoxetine (PROZAC) 20 MG capsule Take 1 capsule (20 mg total) by mouth daily.  Marland Kitchen levonorgestrel-ethinyl estradiol (AVIANE,ALESSE,LESSINA) 0.1-20 MG-MCG tablet Take 1 tablet by mouth daily.  Marland Kitchen MICROGESTIN FE 1.5/30 1.5-30 MG-MCG tablet TAKE 1 TABLET BY MOUTH DAILY  . triamcinolone cream (KENALOG) 0.1 % Apply 1 application topically 2 (two) times daily.  . chloroquine (ARALEN) 500 MG tablet Take 1 tablet weekly, begin 2 weeks prior to travel and complete 4 weeks after travel.  . lisdexamfetamine (VYVANSE) 40 MG capsule Take 1 capsule (40 mg  total) by mouth every morning.  . typhoid (VIVOTIF BERNA VACCINE) DR capsule Take 1 capsule by mouth every other day. Take 4 doses. Complete a week prior to exposure    Past Psychiatric History/Hospitalization(s): Anxiety: No Bipolar Disorder: No Depression: Yes Mania: No Psychosis: No Schizophrenia: No Personality Disorder: No Hospitalization for psychiatric illness: No History of Electroconvulsive Shock Therapy: No Prior Suicide Attempts: No  Physical Exam: Constitutional:  BP 111/53  Pulse 83  Ht 5\' 4"  (1.626 m)   Wt 249 lb 12.8 oz (113.309 kg)  BMI 42.86 kg/m2  General Appearance: alert, oriented, no acute distress and obese  Musculoskeletal: Strength & Muscle Tone: within normal limits Gait & Station: normal Patient leans: N/A   Mental Status Examination/Evaluation: Objective: Attitude: Calm and cooperative  Appearance: Casual and orange hair, appears to be stated age  Eye Contact::  Fair  Speech:  Clear and Coherent, Normal Rate and spontaneous  Volume:  Normal  Mood:  euthymic  Affect:  Appropriate  Thought Process:  Intact, Linear and Logical  Orientation:  Full (Time, Place, and Person)  Thought Content:  Negative  Suicidal Thoughts:  No  Homicidal Thoughts:  No  Judgement:  Fair  Insight:  Fair  Concentration: good  Memory: Immediate-intact Recent-intact Remote-intact  Recall: fair  Language: fair  Gait and Station: normal  [employer] of Knowledge: average  Psychomotor Activity:  Normal  Akathisia:  No  Handed:  Right  AIMS (if indicated): n/a  Assets:  Armed forces logistics/support/administrative officer Desire for Improvement Stage manager (Choose Three): Review of Psycho-Social Stressors (1), Review or order clinical lab tests (1), Established Problem, Worsening (2), Review of Last Therapy Session (1) and Review of Medication Regimen & Side Effects (2)  Assessment: Axis I: ADHD combined type, moderate severity; oppositional defiant disorder; dysthymic disorder  Axis II: Deferred  Axis III: Obese Past Medical History  Diagnosis Date  . ADHD (attention deficit hyperactivity disorder)   . Depression   . Oppositional defiant disorder   . Wrist fracture, bilateral   . Shoulder dislocation     bilateral     Axis VZ:DGLOVFI coping skills  Axis V: 65 to 70   Plan: ADHD combined type:  increase Vyvanse to 50 mg one in the morning for ADHD inattentive type. Pt filled 90 day supply of Vyvanse 40mg  recently so will write 10mg  tab to  bridge over.  Dysthymic disorder: Continue Prozac 20 MG one in the morning for dysthymia Oppositional defiant disorder: Mom reports that the patient is doing well with her behavior  Medication management with supportive therapy. Risks/benefits and SE of the medication discussed. Pt verbalized understanding and verbal consent obtained for treatment.  Affirm with the patient that the medications are taken as ordered. Patient expressed understanding of how their medications were to be used.  -worsening of symptoms  Labs 02/12/2014 CBC and CMP WNL, chol and tryglycerides and ldl elevated, HbA1c WNL   Therapy: brief supportive therapy provided. Discussed psychosocial stressors in detail.   encouraged to continue individual counseling  Pt denies SI and is at an acute low risk for suicide.Patient told to call clinic if any problems occur. Patient advised to go to ER if they should develop SI/HI, side effects, or if symptoms worsen. Has crisis numbers to call if needed. Pt verbalized understanding.  F/up in 3 months or sooner if needed  [name], MD

## 2014-10-02 ENCOUNTER — Ambulatory Visit (HOSPITAL_COMMUNITY): Payer: Self-pay | Admitting: Psychology

## 2014-10-16 ENCOUNTER — Ambulatory Visit (INDEPENDENT_AMBULATORY_CARE_PROVIDER_SITE_OTHER): Payer: BC Managed Care – PPO | Admitting: Psychology

## 2014-10-16 DIAGNOSIS — F330 Major depressive disorder, recurrent, mild: Secondary | ICD-10-CM

## 2014-10-16 DIAGNOSIS — F902 Attention-deficit hyperactivity disorder, combined type: Secondary | ICD-10-CM

## 2014-10-16 NOTE — Progress Notes (Signed)
   THERAPIST PROGRESS NOTE  Session Time: 8:03am-8.43am  Participation Level: Active  Behavioral Response: Well GroomedAlert, Tired  Type of Therapy: Individual Therapy  Treatment Goals addressed: Diagnosis: MDD, ADHD and goal 1.  Interventions: CBT and Supportive  Summary: Wendy Levy is a 18 y.o. female who presents with report of being very tired as she had to wake earlier than normal to come to appointment.  Pt reported that she has had a lot happening and reported that guy she was dating broke up w/ her 2 weeks ago- blaming that she was being too clingy.  Pt discussed how he didn't want any interaction that was not face to face.  Pt discussed how initially depressed mood increased and didn't want to do anything- but reports that she has maintained with school assignments and no longer tearful episodes.  Pt also reports that she is sleeping about 9 hours- bed at 3am, wakes at 12pm and sometimes hour nap in afternoon.  Pt reports sought support of friends for coping.  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  Pt discussed continuing to set boundaries w/ her friend and feels that this is going better. Pt reported that her old Education officer, museum is coming to [employer].  Pt reports sister invited to her bio mom's for Thanksgiving- but pt declined as aware not good for her.  Pt reports still some contact w/ ex through text- but still recognizes that needs set boundary for no contact to resolve feelings still has for him.   Suicidal/Homicidal: Nowithout intent/plan  Therapist Response: Assessed pt current functioning per pt report.  Processed w/pt mood and effect from recent relationship breakup.  Explored w/pt how coping w/ her supports and establishing healthy boundaries.  Encouraged pt to explore training options for making informed choice.    Plan: Return again in 2 weeks.  Diagnosis: Axis I: ADHD, combined type  and MDD, recurrent, mild    Axis II: No diagnosis    Seabron Iannello, LPC 10/16/2014

## 2014-10-30 ENCOUNTER — Ambulatory Visit (INDEPENDENT_AMBULATORY_CARE_PROVIDER_SITE_OTHER): Payer: BC Managed Care – PPO | Admitting: Psychology

## 2014-10-30 DIAGNOSIS — F902 Attention-deficit hyperactivity disorder, combined type: Secondary | ICD-10-CM

## 2014-10-30 DIAGNOSIS — F330 Major depressive disorder, recurrent, mild: Secondary | ICD-10-CM

## 2014-10-30 NOTE — Progress Notes (Signed)
   THERAPIST PROGRESS NOTE  Session Time: 8.03am-8.45am  Participation Level: Active  Behavioral Response: Fairly Groomed, AFFECT Congruent w/ report of tired  Type of Therapy: Individual Therapy  Treatment Goals addressed: Diagnosis: MDD, ADHD and goal 1.  Interventions: Motivational Interviewing and Supportive  Summary: Wendy Levy is a 18 y.o. female who presents with report of being really tired- pt reports that doesn't do well w/ early morning appointments and that she and mom have discussed and have cancelled remaining early morning for afternoons.  Pt did well disclosing and staying engaged despite tired.  Pt reports only one week of school left then exams and looking forward to her break. Pt also reported she did sign up next semester for Intro to Communication, Eng 111 and Intro Spanish.  Pt reported that her mom has set allowance this month as incentive for either getting learner's permit or job. Pt discussed these options and states might go for permit.  Pt also discussed setting boundaries with friend as was feeling used and was able to assert w/ friend boundaries. Pt discussed want to move out of state w/ friends she has through social media.  Pt discussed want for change in scenery and became more aware of need to have plan and functioning w/ more independence in order for this to be successful move.   Suicidal/Homicidal: Nowithout intent/plan  Therapist Response: Assessed pt current functioning per pt report.  Explored w/pt academic progress and wants for continued schooling and plans for future.  Processed w/pt relationship w/ friend and progress w/ setting boundaries.  Processed w/pt want to move and what she is looking for w/ move and discussed steps for successful independence.    Plan: Return again in 2 weeks.  Diagnosis: : MDD and ADHD    Lakota Markgraf, [facility] 10/30/2014

## 2014-11-13 ENCOUNTER — Ambulatory Visit (HOSPITAL_COMMUNITY): Payer: Self-pay | Admitting: Psychology

## 2014-11-13 ENCOUNTER — Telehealth (HOSPITAL_COMMUNITY): Payer: Self-pay | Admitting: *Deleted

## 2014-11-13 NOTE — Telephone Encounter (Signed)
Mother left VM: Needs Vyvanse prescription Last time was given 10 mg to add to 40 mg to = 50 mg. If effective, plan was to increase Vyvanse to 50 mg on next RX. Mother states effective, asking for Vyvanse 50 mg at this time, states 30 day RX will be okay, 90 day is preferable

## 2014-11-13 NOTE — Telephone Encounter (Signed)
Contacted mother: Provider not in office today - will receive request 11/14/14

## 2014-11-14 ENCOUNTER — Telehealth (HOSPITAL_COMMUNITY): Payer: Self-pay

## 2014-11-14 ENCOUNTER — Ambulatory Visit (INDEPENDENT_AMBULATORY_CARE_PROVIDER_SITE_OTHER): Payer: BC Managed Care – PPO | Admitting: Psychology

## 2014-11-14 ENCOUNTER — Other Ambulatory Visit (HOSPITAL_COMMUNITY): Payer: Self-pay | Admitting: Psychiatry

## 2014-11-14 DIAGNOSIS — F902 Attention-deficit hyperactivity disorder, combined type: Secondary | ICD-10-CM

## 2014-11-14 DIAGNOSIS — F900 Attention-deficit hyperactivity disorder, predominantly inattentive type: Secondary | ICD-10-CM

## 2014-11-14 DIAGNOSIS — F330 Major depressive disorder, recurrent, mild: Secondary | ICD-10-CM

## 2014-11-14 MED ORDER — LISDEXAMFETAMINE DIMESYLATE 50 MG PO CAPS: 50.0000 mg | ORAL_CAPSULE | ORAL | Status: DC

## 2014-11-14 NOTE — Telephone Encounter (Signed)
Wendy Levy picked up her prescription on 11/14/14/dlo

## 2014-11-14 NOTE — Progress Notes (Signed)
   THERAPIST PROGRESS NOTE  Session Time: 1.32pm-2.12pm  Participation Level: Active  Behavioral Response: Well GroomedAlertDepressed  Type of Therapy: Individual Therapy  Treatment Goals addressed: Diagnosis: ADHD, MDD and goal 1.  Interventions: Strength-based and Supportive  Summary: Wendy Levy is a 18 y.o. female who presents with depressed affect.  Pt reports tired.  Pt reported finished classes last week and believes earned her credits. Pt reported she has been coloring adult coloring books for something to do and for gifts.  Pt reported that her mom is strongly encouraging her for learner's permit and she has been practice driving.  Pt reports not feeling very motivated for and not sure if ready for the written test. Pt report her sister is moving to an apartment in [place] and she hopes to see her more often then.  Pt reported breakup w/ boyfriend- long distance relationship.  Pt reported not surprised as dated in past and ended same way and expected about the same.  Pt didn't feel good w/ abdominal cramping- ended session early.   Suicidal/Homicidal: Nowithout intent/plan  Therapist Response: Assessed pt current functioning per pt report.  Processed w/ pt end of semester and how spending time during winter break.  Explored w/pt steps towards learner's permit and encouraged pt ability w/ practice to complete successfully.  Discussed recent friend and family interactions.  Plan: Return again in 2 weeks.  Diagnosis:  ADHD, combined type and MDD      YATES,LEANNE, [facility] 11/14/2014

## 2014-11-25 ENCOUNTER — Ambulatory Visit (INDEPENDENT_AMBULATORY_CARE_PROVIDER_SITE_OTHER): Payer: BC Managed Care – PPO | Admitting: Psychology

## 2014-11-25 DIAGNOSIS — F902 Attention-deficit hyperactivity disorder, combined type: Secondary | ICD-10-CM

## 2014-11-25 DIAGNOSIS — F330 Major depressive disorder, recurrent, mild: Secondary | ICD-10-CM

## 2014-11-25 NOTE — Progress Notes (Signed)
   THERAPIST PROGRESS NOTE  Session Time: 11.30am-12.10pm  Participation Level: Active  Behavioral Response: Well GroomedAlertaffect bright  Type of Therapy: Individual Therapy  Treatment Goals addressed: Diagnosis: MDD and goal 1.  Interventions: Motivational Interviewing and Supportive  Summary: Wendy Levy is a 18 y.o. female who presents with full and bright.  Pt reports that her energy is improved and that she is no longer sleeping as much- pt reports that getting out of her room is helpful as just falls asleep when in room.  Pt reported that she is going to bed at 2 am on the winter break.  Pt reports holidays and family interactions are positive.  Pt reported that minimal social interaction as avoiding "drama" w/ friends.  Pt reports that she feels connected w/ online gaming relationships.  Pt also reports dating new guy who knows online and living in [place]. Pt reported want to move there this summer and aware that healthiest if able to be independent before making that move.  Pt reports that she hasn't discussed w/ her mom at this point other than a visit.  Pt reports she is more motivated to drive for this potential move and states she is practicing w/ mom and beginning written practice tests.  Pt will start back at [employer] on 12/09/13 taking Eng MWF 11am, Communications MWF 12pm and Spanish M at 1pm.   Suicidal/Homicidal: Nowithout intent/plan  Therapist Response: Assessed pt current functioning per pt report. Explored w/pt mood and steps pt taking for improved energy and healthy sleep.  Processed w/pt interactions over her winter break and explored w/pt her wants to move.  Discussed w/pt what she will need for independence and assisting pt to identify steps towards healthy independence.  Reiterated to pt importance independence for self and not jumping into relationship and dependence on that person.    Plan: Return again in 3 weeks.  Diagnosis:  ADHD, combined type and  MDD      Wendy Levy, [facility] 11/25/2014

## 2014-11-27 ENCOUNTER — Ambulatory Visit (HOSPITAL_COMMUNITY): Payer: Self-pay | Admitting: Psychology

## 2014-11-28 ENCOUNTER — Ambulatory Visit (HOSPITAL_COMMUNITY): Payer: Self-pay | Admitting: Psychology

## 2014-11-29 DIAGNOSIS — Z8619 Personal history of other infectious and parasitic diseases: Secondary | ICD-10-CM

## 2014-11-29 HISTORY — DX: Personal history of other infectious and parasitic diseases: Z86.19

## 2014-12-10 ENCOUNTER — Ambulatory Visit (INDEPENDENT_AMBULATORY_CARE_PROVIDER_SITE_OTHER): Payer: BLUE CROSS/BLUE SHIELD | Admitting: Psychiatry

## 2014-12-10 ENCOUNTER — Encounter (HOSPITAL_COMMUNITY): Payer: Self-pay | Admitting: Psychiatry

## 2014-12-10 VITALS — BP 123/80 | HR 91 | Ht 64.0 in | Wt 251.0 lb

## 2014-12-10 DIAGNOSIS — F913 Oppositional defiant disorder: Secondary | ICD-10-CM

## 2014-12-10 DIAGNOSIS — F341 Dysthymic disorder: Secondary | ICD-10-CM

## 2014-12-10 DIAGNOSIS — F902 Attention-deficit hyperactivity disorder, combined type: Secondary | ICD-10-CM

## 2014-12-10 NOTE — Progress Notes (Signed)
[place] [phone] Progress Note  Danylle Ouk [id number] 19 y.o.   Chief Complaint: "good"  History of Present Illness: Patient is a 19 year old diagnosed with ADHD combined type, Dysthmia and ODD who presents today for a followup visit with her mother.  The new semester started yesterday and pt is now taking 50mg  of Vyvanse. Mom states pt is concentrating longer and into the early evening. Reports she is get irritable and is snapping at her friends several times a week. Her period is about to start.   Pt denies depression and sad mood. Pt is trying to make new friends. Denies isolation and anhedonia. Pt is playing a lot of video games to escape. Pt is readjusting her sleep schedule now that school is on again. Energy is slowly improving and she is no longer napping. Appetite is increased.   Anxiety is decreased as compared to before.   Denies AVH. Denies irritability, hopelessness and worthleness.   Denies SE from meds.    Mom has no concerns and states pt has been doing really well. Denies any behavorial issues.   Suicidal Ideation: No Plan Formed: No Patient has means to carry out plan: No  Homicidal Ideation: No Plan Formed: No Patient has means to carry out plan: No  Review of Systems:  Review of Systems  Constitution: Negative. Negative for decreased appetite, fever, weakness and weight gain.  HENT: Positive for headaches. Negative for congestion, nosebleeds and sore throat.   Eyes: Negative for blurred vision, pain and photophobia.  Cardiovascular: Negative.  Negative for chest pain, leg swelling and palpitations.  Respiratory: Negative.   Endocrine: Negative.   Skin: Negative.   Musculoskeletal: Negative.   Gastrointestinal: Negative.  Negative for bloating, abdominal pain, constipation, diarrhea, nausea and vomiting.  Neurological: Negative for aphonia, brief paralysis and seizures.  Psychiatric/Behavioral: Negative.       Past Medical Family, Social  History: Patient lives with her adoptive mom. Patient at [facility].  reports that she has never smoked. She has never used smokeless tobacco. She reports that she does not drink alcohol or use illicit drugs.  Family History  Problem Relation Age of Onset  . Adopted: Yes  . Depression Sister     reportedly in and out of treatment facilities, also dx w/ RAD  . Suicidality Sister     Past Medical History  Diagnosis Date  . ADHD (attention deficit hyperactivity disorder)   . Depression   . Oppositional defiant disorder   . Wrist fracture, bilateral   . Shoulder dislocation     bilateral     Outpatient Encounter Prescriptions as of 12/10/2014  Medication Sig  . cetirizine (ZYRTEC) 10 MG tablet Take 10 mg by mouth daily as needed for allergies.  . chloroquine (ARALEN) 500 MG tablet Take 1 tablet weekly, begin 2 weeks prior to travel and complete 4 weeks after travel.  Marland Kitchen FLUoxetine (PROZAC) 20 MG capsule Take 1 capsule (20 mg total) by mouth daily.  Marland Kitchen levonorgestrel-ethinyl estradiol (AVIANE,ALESSE,LESSINA) 0.1-20 MG-MCG tablet Take 1 tablet by mouth daily.  Marland Kitchen lisdexamfetamine (VYVANSE) 50 MG capsule Take 1 capsule (50 mg total) by mouth every morning.  Marland Kitchen MICROGESTIN FE 1.5/30 1.5-30 MG-MCG tablet TAKE 1 TABLET BY MOUTH DAILY  . triamcinolone cream (KENALOG) 0.1 % Apply 1 application topically 2 (two) times daily.  . typhoid (VIVOTIF BERNA VACCINE) DR capsule Take 1 capsule by mouth every other day. Take 4 doses. Complete a week prior to exposure    Past Psychiatric History/Hospitalization(s): Anxiety:  No Bipolar Disorder: No Depression: Yes Mania: No Psychosis: No Schizophrenia: No Personality Disorder: No Hospitalization for psychiatric illness: No History of Electroconvulsive Shock Therapy: No Prior Suicide Attempts: No  Physical Exam: Constitutional:  BP 123/80 mmHg  Pulse 91  Ht 5\' 4"  (1.626 m)  Wt 251 lb (113.853 kg)  BMI 43.06 kg/m2  General Appearance: alert, oriented,  no acute distress and obese  Musculoskeletal: Strength & Muscle Tone: within normal limits Gait & Station: normal Patient leans: N/A   Mental Status Examination/Evaluation: Objective: Attitude: Calm and cooperative  Appearance: Casual and orange hair, appears to be stated age  Eye Contact::  Fair  Speech:  Clear and Coherent, Normal Rate and spontaneous  Volume:  Normal  Mood:  euthymic  Affect:  Appropriate  Thought Process:  Intact, Linear and Logical  Orientation:  Full (Time, Place, and Person)  Thought Content:  Negative  Suicidal Thoughts:  No  Homicidal Thoughts:  No  Judgement:  Fair  Insight:  Fair  Concentration: good  Memory: Immediate-intact Recent-intact Remote-intact  Recall: fair  Language: fair  Gait and Station: normal  [employer] of Knowledge: average  Psychomotor Activity:  Normal  Akathisia:  No  Handed:  Right  AIMS (if indicated): n/a  Assets:  Armed forces logistics/support/administrative officer Desire for Improvement Stage manager (Choose Three): Established Problem, Stable/Improving (1), Review of Psycho-Social Stressors (1) and Review of Medication Regimen & Side Effects (2)  Assessment: Axis I: ADHD combined type, moderate severity; oppositional defiant disorder; dysthymic disorder  Axis II: Deferred  Axis III: Obese Past Medical History  Diagnosis Date  . ADHD (attention deficit hyperactivity disorder)   . Depression   . Oppositional defiant disorder   . Wrist fracture, bilateral   . Shoulder dislocation     bilateral     Axis LH:TDSKAJG coping skills  Axis V: 65 to 70   Plan: ADHD combined type:  Vyvanse to 50 mg one in the morning for ADHD inattentive type.  Dysthymic disorder: Continue Prozac 20 MG one in the morning for dysthymia Oppositional defiant disorder: Mom reports that the patient is doing well with her behavior  Medication management with supportive therapy. Risks/benefits and  SE of the medication discussed. Pt verbalized understanding and verbal consent obtained for treatment.  Affirm with the patient that the medications are taken as ordered. Patient expressed understanding of how their medications were to be used.  -improvement of symptoms  Labs 02/12/2014 CBC and CMP WNL, chol and tryglycerides and ldl elevated, HbA1c WNL   Therapy: brief supportive therapy provided. Discussed psychosocial stressors in detail.   encouraged to continue individual counseling  Pt denies SI and is at an acute low risk for suicide.Patient told to call clinic if any problems occur. Patient advised to go to ER if they should develop SI/HI, side effects, or if symptoms worsen. Has crisis numbers to call if needed. Pt verbalized understanding.  F/up in 3 months or sooner if needed  [name], MD

## 2014-12-19 ENCOUNTER — Ambulatory Visit (INDEPENDENT_AMBULATORY_CARE_PROVIDER_SITE_OTHER): Payer: BLUE CROSS/BLUE SHIELD | Admitting: Psychology

## 2014-12-19 DIAGNOSIS — F331 Major depressive disorder, recurrent, moderate: Secondary | ICD-10-CM
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
## 2014-12-25 IMAGING — US US ABDOMEN COMPLETE
1 series · 14 of 25 positions shown · non-contrast
Comparison: None.

CLINICAL DATA: Leukocytosis.  Abdominal pain

ABDOMINAL ULTRASOUND COMPLETE

[Series 1: us abdomen complete · 0.32mm/px · 14 of 71 slices shown]
[im 1/71]
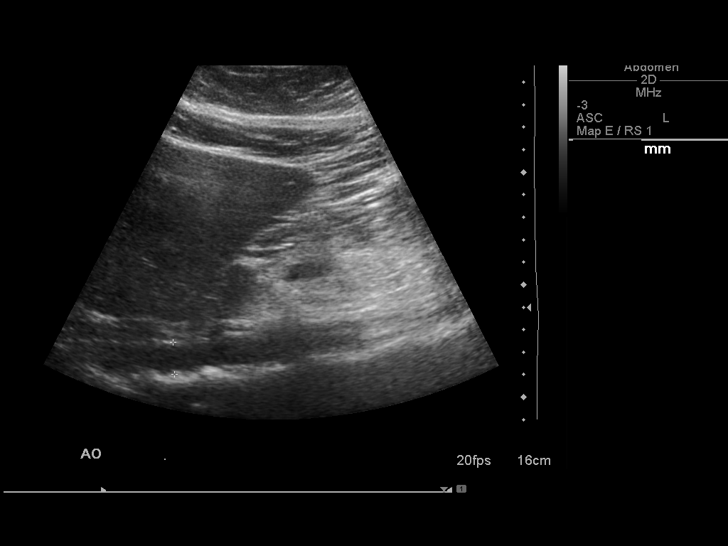
[im 6/71]
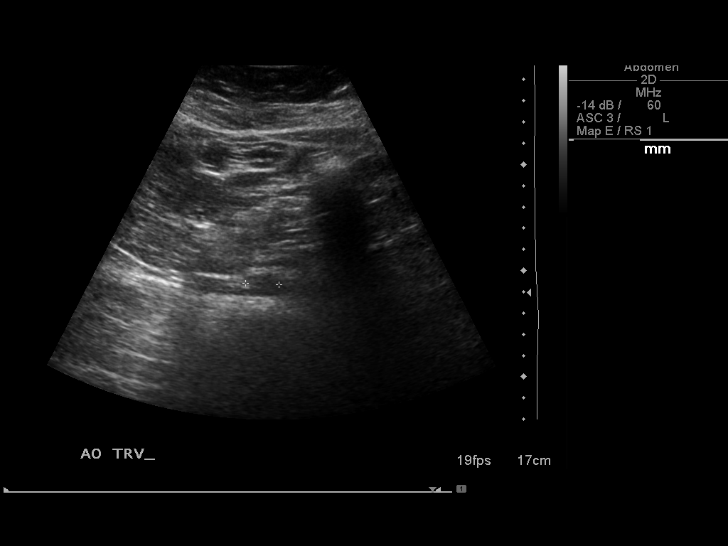
[im 12/71]
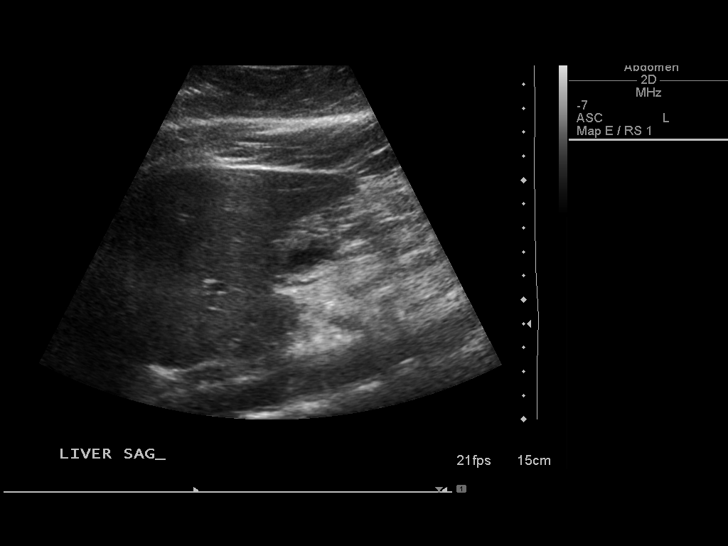
[im 18/71]
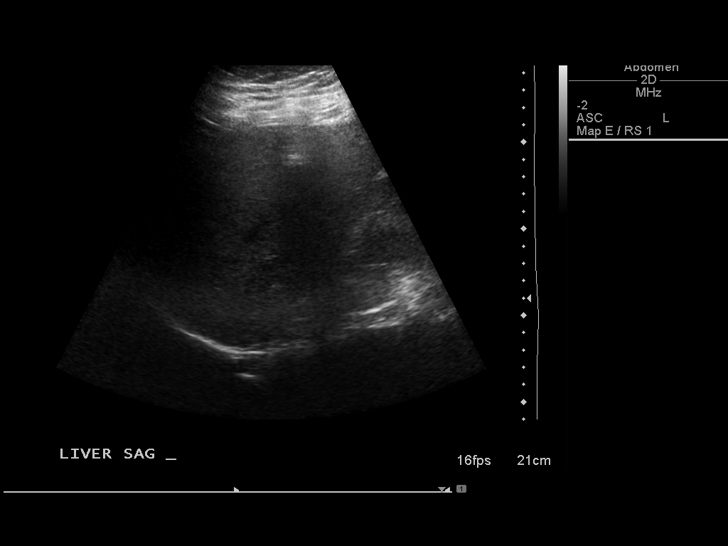
[im 24/71]
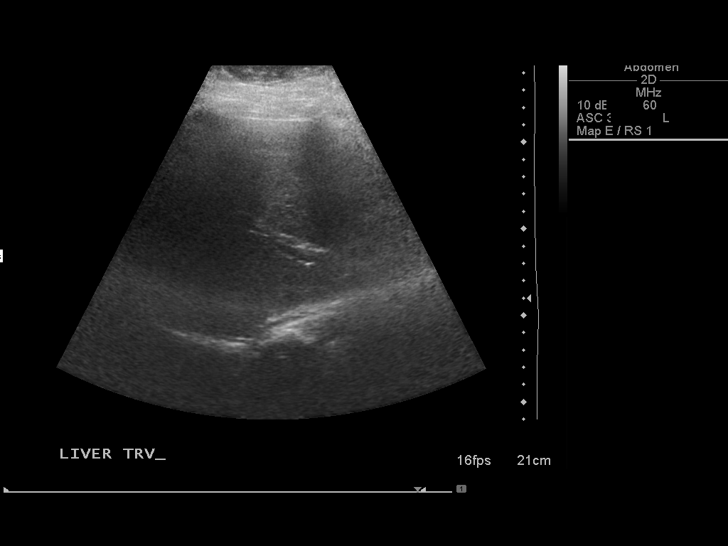
[im 27/71]
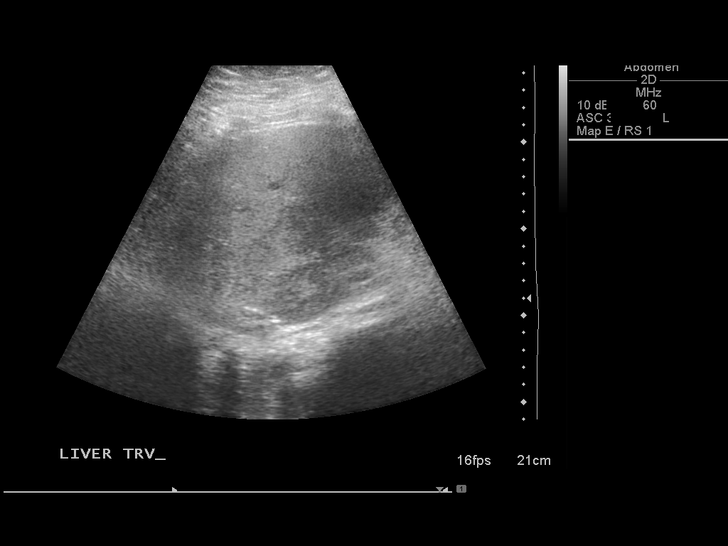
[im 33/71]
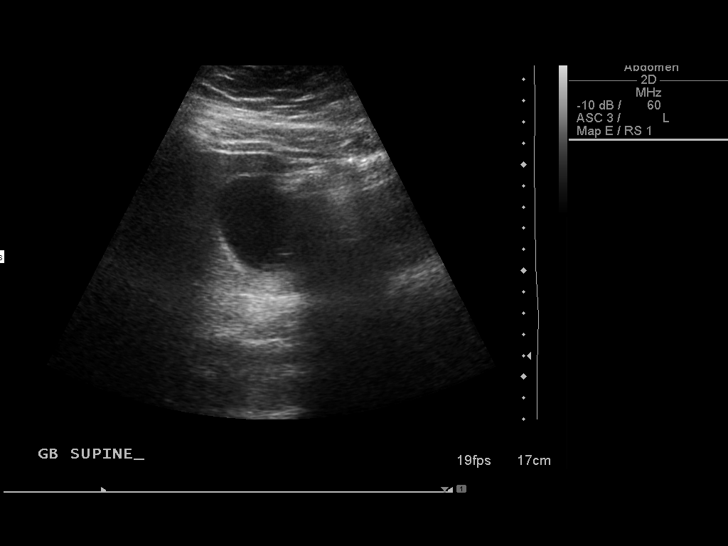
[im 38/71]
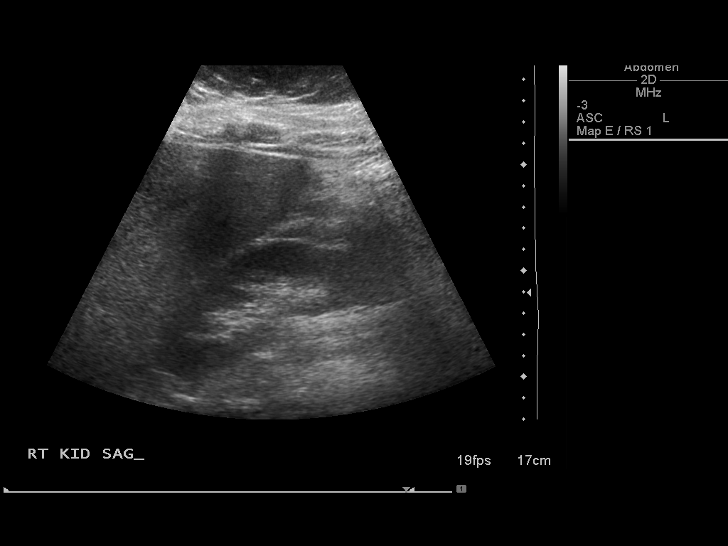
[im 44/71]
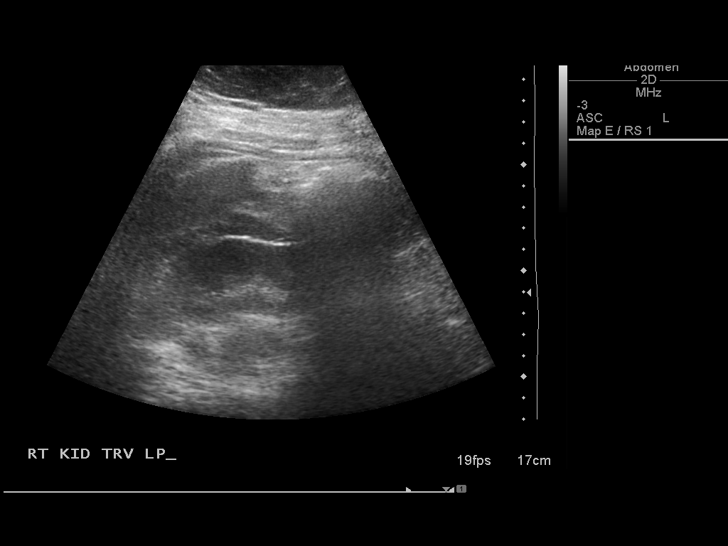
[im 47/71]
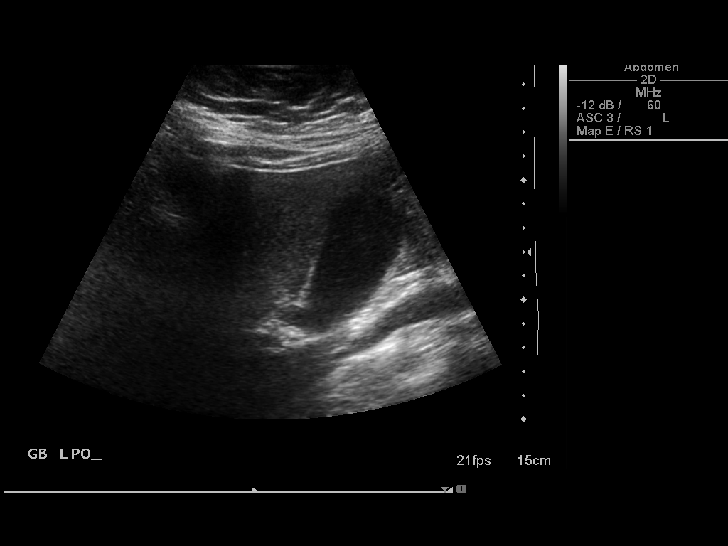
[im 53/71]
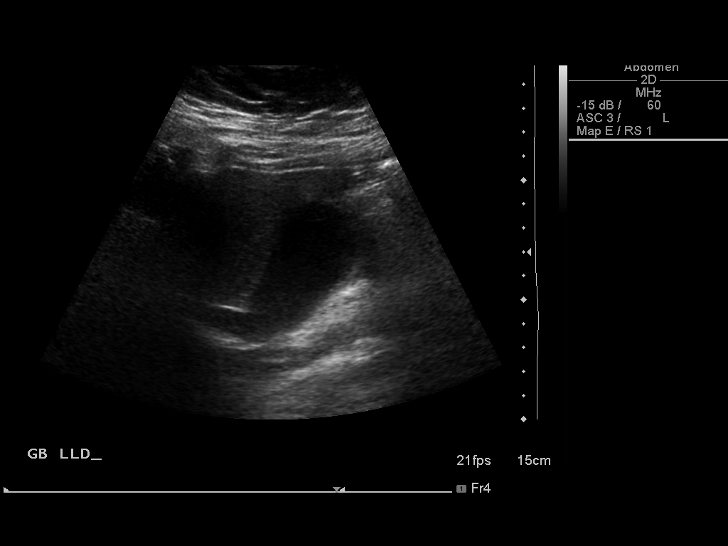
[im 59/71]
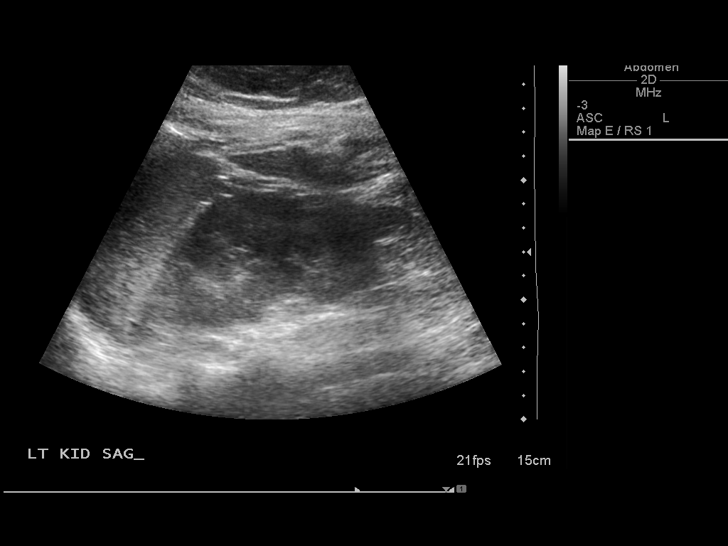
[im 65/71]
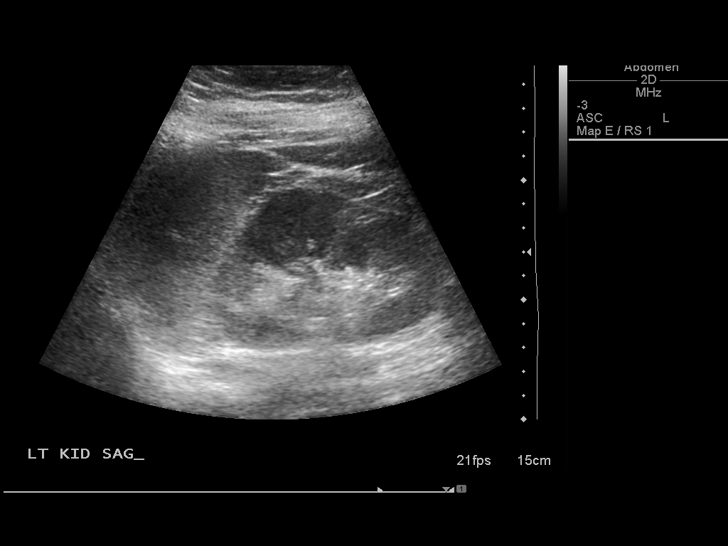
[im 71/71]
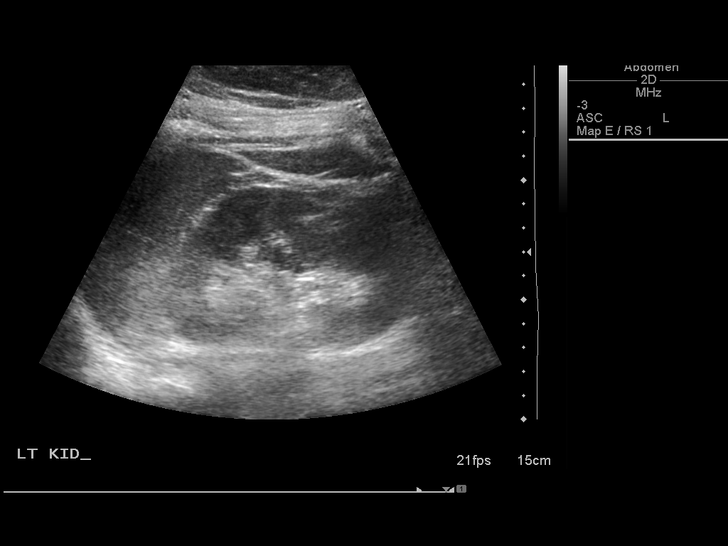

[14 of 25 positions shown; findings below may reference images not displayed]

FINDINGS: Gallbladder:  No gallstones, gallbladder wall thickening, or
pericholecystic fluid.

Common Bile Duct:  Within normal limits in caliber.

Liver: No focal mass lesion identified.  Within normal limits in
parenchymal echogenicity.

IVC:  Appears normal.

Pancreas:  No abnormality identified.

Spleen:  Within normal limits in size and echotexture.

Right kidney:  Normal in size and parenchymal echogenicity.  No
evidence of mass or hydronephrosis.

Left kidney:  Normal in size and parenchymal echogenicity.  No
evidence of mass or hydronephrosis.

Abdominal Aorta:  No aneurysm identified.
IMPRESSION: Negative abdominal ultrasound.

## 2015-01-02 ENCOUNTER — Ambulatory Visit (INDEPENDENT_AMBULATORY_CARE_PROVIDER_SITE_OTHER): Payer: BLUE CROSS/BLUE SHIELD | Admitting: Psychology

## 2015-01-02 DIAGNOSIS — F330 Major depressive disorder, recurrent, mild: Secondary | ICD-10-CM

## 2015-01-02 DIAGNOSIS — F902 Attention-deficit hyperactivity disorder, combined type: Secondary | ICD-10-CM

## 2015-01-02 NOTE — Progress Notes (Signed)
   THERAPIST PROGRESS NOTE  Session Time: 2.28pm-3.10pm  Participation Level: Active  Behavioral Response: Fairly GroomedAlertTired  Type of Therapy: Individual Therapy  Treatment Goals addressed: Diagnosis: MDD, ADHD and goal 1.  Interventions: CBT and Supportive  Summary: Wendy Levy is a 19 y.o. female who presents with affect congruent w/ report of feeling really tired today, just wanting to sleep and low motivation for things today.  Pt denied feeling depressed mood.  Pt reported she went to bed at 2:30am and woke at 11 am.  Pt reported that she is feeling less interest in school but is still getting work down.  Pt reported she does have about 10 minutes of online testing for spanish she has to complete tonight and set priority to do today.  Pt reported that her sister gave birth to a son 2 days ago- hasn't seen and not sure if will visit today.  Pt increased awareness of stressors- w/ break up w/ boyfriend this past week and talking w/ another ex that interested in dating that is giving mixed messages.  Pt discussed also that she has been dealing w/ sleep disturbance waking through the night and at times dealing w/ nausea.  Pt made plan for priorities and agreed to f/u w/ PCP if nausea doesn't improve.  Pt introduced to her past Education officer, museum that has maintained contact with her and pt sees as a support.   Suicidal/Homicidal: Nowithout intent/plan  Therapist Response: Assessed pt current functioning per pt report.  Processed w/pt her fatigue and explored w/pt potential contributing factors.  Explored w/ pt her stressors w/ relationships and reiterated healthy boundaries for self.  Assisted pt in identify priorities and scheduling for her afternoon evening.  Discussed good sleep hygiene.    Plan: Return again in 2 weeks.  Diagnosis: MDD, Sandrea Hughs, [facility] 01/02/2015

## 2015-01-15 ENCOUNTER — Ambulatory Visit (INDEPENDENT_AMBULATORY_CARE_PROVIDER_SITE_OTHER): Payer: BLUE CROSS/BLUE SHIELD | Admitting: Psychology

## 2015-01-15 DIAGNOSIS — F330 Major depressive disorder, recurrent, mild: Secondary | ICD-10-CM

## 2015-01-15 NOTE — Progress Notes (Signed)
   THERAPIST PROGRESS NOTE  Session Time: 9am-9.38am  Participation Level: Active  Behavioral Response: Well GroomedAlert, Tired   Type of Therapy: Individual Therapy  Treatment Goals addressed: Diagnosis: MDD and goal 1.  Interventions: CBT and Other: communication w/ parent  Summary: Wendy Levy is a 19 y.o. female who presents with report of being very tired.  Pt affect congruent- but pt engages in session.  Pt acknowledges she needs to get to bed earlier than did- reporting went to bed at 1am which is an improvement.  Pt reported some conflict w/ mom about her sister seeking mom's assistance and whether to inform mom about sister's wants and needs or not.  Pt feels likes she is getting mixed messages from mom about not being in the middle, but then upset when didn't tell her something.  Pt increased awareness that mom's reaction might be more about how mom's feeling re: assistance providing sister.  Pt reported that she did disclose to mom about being sexually active when discussing that she is late w/ her period. Pt reports she has been consistent about taking birth control has had period 2x since last sexually active- but will take a pregnancy test to confirm not pregnant.   Pt reports she plans on studying and taking driver's license test during spring break in 3 weeks.   Suicidal/Homicidal: Nowithout intent/plan  Therapist Response: Assessed pt current functioning per pt report.  Processed w/pt her fatigue and discussed sleep scheduled and how to improve.  Explored w/ pt her interactions w/ family.  Discussed conflict w/ mom and assisted pt with seeing varying perspectives.  Encouraged pt to be assertive w/ communication and listening to others feelings.  Discussed pt disclosure to mom and recommended to f/u w/ OBGYN if continues to not have a period.  Discussed w/pt plan for progress towards drivers license.   Plan: Return again in 2 weeks.  Diagnosis: MDD,  mild    YATES,LEANNE, LPC 01/15/2015

## 2015-01-29 ENCOUNTER — Ambulatory Visit (INDEPENDENT_AMBULATORY_CARE_PROVIDER_SITE_OTHER): Payer: BLUE CROSS/BLUE SHIELD | Admitting: Psychology

## 2015-01-29 DIAGNOSIS — F330 Major depressive disorder, recurrent, mild: Secondary | ICD-10-CM

## 2015-01-29 DIAGNOSIS — F902 Attention-deficit hyperactivity disorder, combined type: Secondary | ICD-10-CM

## 2015-01-29 NOTE — Progress Notes (Signed)
   THERAPIST PROGRESS NOTE  Session Time: 2.31pm-3.08pm  Participation Level: Active  Behavioral Response: Well GroomedAlert, AFFECT WNL  Type of Therapy: Individual Therapy  Treatment Goals addressed: Diagnosis: MDD, ADHD and goal 1  Interventions: CBT and Supportive  Summary: Wendy Levy is a 19 y.o. female who presents with full and bright affect.  Pt reported that she has been doing well w/ school.  Pt reported that current grades going into Spring Break are communications 56, English 84 and Spanish 75 .  Pt reports that she is staying caught up w/ workload and feels that it is beneficial to have classes scheduled together on same days as better able to manage time.  Pt reported that she did take pregnancy test which was negative and did have her period last week.  Pt reports that she is still dealing w/ ongoing on and off nausea and did vomit 2x last night. Pt reports has felt better today w/ only mild nausea. Pt reports she is following up w/ PCP for check up on 02/03/15.  Pt reported that she is not feeling in the middle between her sister and mom- able to set boundaries w/ sister to f/u w/ mom herself.  Pt shared mindfulness coloring book she has started and enjoys doing for self care.   Suicidal/Homicidal: Nowithout intent/plan  Therapist Response: Assessed pt current functioning per her report.  Explored w/pt her progress w/ school this semester and contributing factors to improvement- reflecting pt strengths and efforts.  Processed w/pt her concerns w/ nausea and encouraged pt f/u w/ PCP to further assess.  Processed w/ pt boundaries she is setting and positive impact that this has had.   Plan: Return again in 2 weeks.  Diagnosis: MDD and ADHD    YATES,LEANNE, [facility] 01/29/2015

## 2015-02-05 ENCOUNTER — Ambulatory Visit (INDEPENDENT_AMBULATORY_CARE_PROVIDER_SITE_OTHER): Payer: BC Managed Care – PPO | Admitting: Physician Assistant

## 2015-02-05 VITALS — BP 108/72 | HR 90 | Temp 98.3°F | Resp 18 | Ht 64.5 in | Wt 255.6 lb

## 2015-02-05 DIAGNOSIS — Z118 Encounter for screening for other infectious and parasitic diseases: Secondary | ICD-10-CM

## 2015-02-05 DIAGNOSIS — N946 Dysmenorrhea, unspecified: Secondary | ICD-10-CM

## 2015-02-05 DIAGNOSIS — Z0000 Encounter for general adult medical examination without abnormal findings: Secondary | ICD-10-CM

## 2015-02-05 DIAGNOSIS — Z114 Encounter for screening for human immunodeficiency virus [HIV]: Secondary | ICD-10-CM

## 2015-02-05 DIAGNOSIS — A749 Chlamydial infection, unspecified: Secondary | ICD-10-CM

## 2015-02-05 DIAGNOSIS — Z6841 Body Mass Index (BMI) 40.0 and over, adult: Secondary | ICD-10-CM | POA: Insufficient documentation

## 2015-02-05 LAB — POCT CBC
GRANULOCYTE PERCENT: 70.1 % (ref 37–80)
HCT, POC: 42.5 % (ref 37.7–47.9)
Hemoglobin: 13.7 g/dL (ref 12.2–16.2)
Lymph, poc: 3.2 (ref 0.6–3.4)
MCH: 27.8 pg (ref 27–31.2)
MCHC: 32.2 g/dL (ref 31.8–35.4)
MCV: 86.2 fL (ref 80–97)
MID (cbc): 0.3 (ref 0–0.9)
MPV: 7.1 fL (ref 0–99.8)
PLATELET COUNT, POC: 405 10*3/uL (ref 142–424)
POC GRANULOCYTE: 8.1 — AB (ref 2–6.9)
POC LYMPH PERCENT: 27.7 %L (ref 10–50)
POC MID %: 2.2 % (ref 0–12)
RBC: 4.93 M/uL (ref 4.04–5.48)
RDW, POC: 15.1 %
WBC: 11.6 10*3/uL — AB (ref 4.6–10.2)

## 2015-02-05 LAB — COMPREHENSIVE METABOLIC PANEL
ALK PHOS: 71 U/L (ref 39–117)
ALT: 33 U/L (ref 0–35)
AST: 21 U/L (ref 0–37)
Albumin: 4.2 g/dL (ref 3.5–5.2)
BILIRUBIN TOTAL: 0.5 mg/dL (ref 0.2–1.1)
BUN: 10 mg/dL (ref 6–23)
CALCIUM: 9.5 mg/dL (ref 8.4–10.5)
CO2: 21 meq/L (ref 19–32)
Chloride: 102 mEq/L (ref 96–112)
Creat: 0.72 mg/dL (ref 0.50–1.10)
Glucose, Bld: 72 mg/dL (ref 70–99)
Potassium: 4.8 mEq/L (ref 3.5–5.3)
Sodium: 135 mEq/L (ref 135–145)
Total Protein: 7.1 g/dL (ref 6.0–8.3)

## 2015-02-05 LAB — GLUCOSE, POCT (MANUAL RESULT ENTRY): POC GLUCOSE: 77 mg/dL (ref 70–99)

## 2015-02-05 MED ORDER — LEVONORGESTREL-ETHINYL ESTRAD 0.1-20 MG-MCG PO TABS: 1.0000 | ORAL_TABLET | Freq: Every day | ORAL | Status: DC

## 2015-02-05 NOTE — Progress Notes (Signed)
Patient ID: Wendy Levy, female    DOB: 10/22/1996, 19 y.o.   MRN: [id number]  PCP: Lamar Blinks, MD  Subjective:   Chief Complaint  Patient presents with  . Annual Exam    HPI Presents for annual physical exam. She is accompanied by her mother. Needs a refill of her oral contraceptive pill and has a list of things she'd like to discuss.  1. Taking ibuprofen for head, side and back.  Pain comes and goes. "Mainly after I wake up when it's later than usual." Headache may be sinus related, as it is often when the weather is changing. Typically begins behind the eyes and then moves to the forehead. Reolves when she takes ibuprofen. Occurs about twice per week. Knows that she will get a headache when she sees rain clouds.  2. Period. Normal menstrual period in December (11/22/2014). January it was very light. February, didn't get her period when expected, so stopped the pills. 3 home pregnancy tests were negative. Menses came about 3 weeks later, and was heavier than usual. Restarted her pills after starting her last period. Has become sexually active, but not since 09/2014.   3. Stomach problems. Last week ate a Hot Pocket and sugary soda for lunch and then nothing else for the rest of the day. Around 6:30 pm ate 2 packs of Fruit Snacks and then started feeling sick to her stomach, vomited. Several hours later, thinking she was well, she ate a melted cheese sandwich and vomited again. Has not happened since. Prior to that had experienced intermittent nausea, diarrhea and constipation.  Her mother is concerned that she doesn't get much exercise and doesn't make healthy eating choices. She's concerned that the patient's weight increases her risk of other health problems, like diabetes and heart disease.  Review of Systems Review of Systems  Constitutional: Negative.   HENT: Negative for dental problem, ear pain, postnasal drip, rhinorrhea, sinus pressure and sneezing.   Eyes:  Negative.   Respiratory: Negative.   Cardiovascular: Negative.   Gastrointestinal: Positive for nausea, vomiting, diarrhea and constipation.  Endocrine: Negative.   Genitourinary: Negative.   Musculoskeletal: Positive for myalgias and back pain. Negative for joint swelling, arthralgias, gait problem, neck pain and neck stiffness.  Skin: Negative.   Allergic/Immunologic: Negative.   Neurological: Positive for headaches. Negative for dizziness, speech difficulty, weakness and light-headedness.  Hematological: Negative.   Psychiatric/Behavioral: Negative for hallucinations, confusion, sleep disturbance and self-injury.       Patient Active Problem List   Diagnosis Date Noted  . BMI 40.0-44.9, adult 02/05/2015  . Migraine, unspecified, without mention of intractable migraine without mention of status migrainosus 03/15/2013  . Major depressive disorder, recurrent episode 01/31/2012  . ADHD (attention deficit hyperactivity disorder), combined type 11/02/2011  . ODD (oppositional defiant disorder) 11/02/2011  . Dysthymic disorder 11/02/2011    Prior to Admission medications   Medication Sig Start Date End Date Taking? Authorizing Provider  cetirizine (ZYRTEC) 10 MG tablet Take 10 mg by mouth daily as needed for allergies.   Yes Historical Provider, MD  FLUoxetine (PROZAC) 20 MG capsule Take 1 capsule (20 mg total) by mouth daily. 05/28/14  Yes Madison Hickman, NP  levonorgestrel-ethinyl estradiol (AVIANE,ALESSE,LESSINA) 0.1-20 MG-MCG tablet Take 1 tablet by mouth daily. 04/03/14  Yes Gay Filler Copland, MD  lisdexamfetamine (VYVANSE) 50 MG capsule Take 1 capsule (50 mg total) by mouth every morning. 11/14/14 02/13/15 Yes [name], MD    Allergies  Allergen Reactions  . Benadryl Anti-Itch Childrens [Camphor]  Rash    Rash with topical product    Past Medical History  Diagnosis Date  . ADHD (attention deficit hyperactivity disorder)   . Depression   . Oppositional defiant  disorder   . Wrist fracture, bilateral   . Shoulder dislocation     bilateral  . Allergy   . Anxiety      Past Surgical History  Procedure Laterality Date  . Wisdom tooth extraction  04/2013    Family History  Problem Relation Age of Onset  . Adopted: Yes  . Depression Sister     reportedly in and out of treatment facilities, also dx w/ RAD  . Suicidality Sister     History   Social History  . Marital Status: Single    Spouse Name: n/a  . Number of Children: 0  . Years of Education: N/A   Occupational History  . Programmer, applications   Social History Main Topics  . Smoking status: Never Smoker   . Smokeless tobacco: Never Used  . Alcohol Use: No  . Drug Use: No  . Sexual Activity:    Partners: Male    Birth Control/ Protection: Condom   Other Topics Concern  . None   Social History Narrative   Lives with her mother (adopted).  Her sister lives in [place], [place] with her own son Jamse Arn, born 12/31/2014.   Graduated from [employer].         Objective:  Physical Exam  Physical Exam  Constitutional: She is oriented to person, place, and time. Vital signs are normal. She appears well-developed and well-nourished. She is active and cooperative. No distress.  BP 108/72 mmHg  Pulse 90  Temp(Src) 98.3 F (36.8 C) (Oral)  Resp 18  Ht 5' 4.5" (1.638 m)  Wt 255 lb 9.6 oz (115.939 kg)  BMI 43.21 kg/m2  SpO2 97%  LMP 01/22/2015   HENT:  Head: Normocephalic and atraumatic.  Right Ear: Hearing, tympanic membrane, external ear and ear canal normal. No foreign bodies.  Left Ear: Hearing, tympanic membrane, external ear and ear canal normal. No foreign bodies.  Nose: Nose normal.  Mouth/Throat: Uvula is midline, oropharynx is clear and moist and mucous membranes are normal. No oral lesions. Normal dentition. No dental abscesses or uvula swelling. No oropharyngeal exudate.  Eyes: Conjunctivae, EOM and lids are normal. Pupils are equal, round, and  reactive to light. Right eye exhibits no discharge. Left eye exhibits no discharge. No scleral icterus.  Fundoscopic exam:      The right eye shows no arteriolar narrowing, no AV nicking, no exudate, no hemorrhage and no papilledema. The right eye shows red reflex.       The left eye shows no arteriolar narrowing, no AV nicking, no exudate, no hemorrhage and no papilledema. The left eye shows red reflex.  Neck: Trachea normal, normal range of motion and full passive range of motion without pain. Neck supple. No spinous process tenderness and no muscular tenderness present. No thyroid mass and no thyromegaly present.  Cardiovascular: Normal rate, regular rhythm, normal heart sounds, intact distal pulses and normal pulses.   Pulmonary/Chest: Effort normal and breath sounds normal.  Abdominal: Soft. Bowel sounds are normal. She exhibits no mass. There is no tenderness.  Musculoskeletal: She exhibits no edema or tenderness.       Cervical back: Normal.       Thoracic back: Normal.       Lumbar back: Normal.  Lymphadenopathy:  Head (right side): No tonsillar, no preauricular, no posterior auricular and no occipital adenopathy present.       Head (left side): No tonsillar, no preauricular, no posterior auricular and no occipital adenopathy present.    She has no cervical adenopathy.       Right: No supraclavicular adenopathy present.       Left: No supraclavicular adenopathy present.  Neurological: She is alert and oriented to person, place, and time. She has normal strength and normal reflexes. No cranial nerve deficit. She exhibits normal muscle tone. Coordination and gait normal.  Skin: Skin is warm, dry and intact. No rash noted. She is not diaphoretic. No cyanosis or erythema. Nails show no clubbing.  Psychiatric: She has a normal mood and affect. Her speech is normal and behavior is normal. Judgment and thought content normal.           Assessment & Plan:  1. Annual physical  exam Age appropriate anticipatory guidance provided. No pap testing indicated until age 50. Current on vaccinations.  2. BMI 40.0-44.9, adult Counseled on healthy eating and regular exercise, for risk reduction and for reduced constipation. - POCT CBC - POCT glucose (manual entry) - Comprehensive metabolic panel - TSH  3. Screening for chlamydial disease - Chlamydia trachomatis, RNA  4. Screening for HIV (human immunodeficiency virus) - HIV antibody  5. Menstrual cramps Continue for dysmenorrhea. Counseled on using condoms to prevent STIs and on continuing the pills even if her period doesn't occur when she's expecting it. - levonorgestrel-ethinyl estradiol (AVIANE,ALESSE,LESSINA) 0.1-20 MG-MCG tablet; Take 1 tablet by mouth daily.  Dispense: 3 Package; Refill: [place], PA-C Physician Assistant-Certified [facility]

## 2015-02-05 NOTE — Patient Instructions (Signed)
I will contact you with your lab results as soon as they are available.   If you have not heard from me in 2 weeks, please contact me.  The fastest way to get your results is to register for My Chart (see the instructions on the last page of this printout).  Keeping You Healthy  Get These Tests 1. Blood Pressure- Have your blood pressure checked once a year by your health care provider.  Normal blood pressure is 120/80. 2. Weight- Have your body mass index (BMI) calculated to screen for obesity.  BMI is measure of body fat based on height and weight.  You can also calculate your own BMI at GravelBags.it. 3. Cholesterol- Have your cholesterol checked every 5 years starting at age 84 then yearly starting at age 12. 70. Chlamydia, HIV, and other sexually transmitted diseases- Get screened every year until age 40, then within three months of each new sexual provider. 5. Pap Smear- Every 1-5 years; discuss with your health care provider. 6. Mammogram- Every year starting at age 46  Take these medicines  Calcium with Vitamin D-Your body needs 1200 mg of Calcium each day and [id number] IU of Vitamin D daily.  Your body can only absorb 500 mg of Calcium at a time so Calcium must be taken in 2 or 3 divided doses throughout the day.  Multivitamin with folic acid- Once daily if it is possible for you to become pregnant.  Get these Immunizations  Gardasil-Series of three doses; prevents HPV related illness such as genital warts and cervical cancer.  Menactra-Single dose; prevents meningitis.  Tetanus shot- Every 10 years.  Flu shot-Every year.  Take these steps 1. Do not smoke-Your healthcare provider can help you quit.  For tips on how to quit go to www.smokefree.gov or call 1-800 QUITNOW. 2. Be physically active- Exercise 5 days a week for at least 30 minutes.  If you are not already physically active, start slow and gradually work up to 30 minutes of moderate physical activity.   Examples of moderate activity include walking briskly, dancing, swimming, bicycling, etc. 3. Breast Cancer- A self breast exam every month is important for early detection of breast cancer.  For more information and instruction on self breast exams, ask your healthcare provider or https://www.patel.info/. 4. Eat a healthy diet- Eat a variety of healthy foods such as fruits, vegetables, whole grains, low fat milk, low fat cheeses, yogurt, lean meats, poultry and fish, beans, nuts, tofu, etc.  For more information go to www. Thenutritionsource.org 5. Drink alcohol in moderation- Limit alcohol intake to one drink or less per day. Never drink and drive. 6. Depression- Your emotional health is as important as your physical health.  If you're feeling down or losing interest in things you normally enjoy please talk to your healthcare provider about being screened for depression. 7. Dental visit- Brush and floss your teeth twice daily; visit your dentist twice a year. 8. Eye doctor- Get an eye exam at least every 2 years. 9. Helmet use- Always wear a helmet when riding a bicycle, motorcycle, rollerblading or skateboarding. 26. Safe sex- If you may be exposed to sexually transmitted infections, use a condom. 11. Seat belts- Seat belts can save your live; always wear one. 12. Smoke/Carbon Monoxide detectors- These detectors need to be installed on the appropriate level of your home. Replace batteries at least once a year. 13. Skin cancer- When out in the sun please cover up and use sunscreen 15 SPF or higher.  14. Violence- If anyone is threatening or hurting you, please tell your healthcare provider.

## 2015-02-06 LAB — HIV ANTIBODY (ROUTINE TESTING W REFLEX): HIV 1&2 Ab, 4th Generation: NONREACTIVE

## 2015-02-06 LAB — TSH: TSH: 2.304 u[IU]/mL (ref 0.350–4.500)

## 2015-02-13 ENCOUNTER — Ambulatory Visit (INDEPENDENT_AMBULATORY_CARE_PROVIDER_SITE_OTHER): Payer: BLUE CROSS/BLUE SHIELD | Admitting: Psychology

## 2015-02-13 DIAGNOSIS — F902 Attention-deficit hyperactivity disorder, combined type: Secondary | ICD-10-CM

## 2015-02-13 DIAGNOSIS — F330 Major depressive disorder, recurrent, mild: Secondary | ICD-10-CM

## 2015-02-13 NOTE — Progress Notes (Signed)
   THERAPIST PROGRESS NOTE  Session Time: 1.30pm-2.10apm  Participation Level: Active  Behavioral Response: Fairly GroomedAlertAnxious  Type of Therapy: Individual Therapy  Treatment Goals addressed: Diagnosis: MDD, ADHD and goal 1.   Interventions: CBT and Supportive  Summary: Wendy Levy is a 19 y.o. female who presents with full and bright affect today.  Pt reports tired as didn't sleep well staying at sister's last night between a lot of noises in the apartment complex and newborn baby.  Pt reported that she had a good spring break- except finding out about old friend dying from drug overdose. Pt reported this distracted her from homework earlier this week.  Pt reported she didn't go for her driver's test as she "froze up".  Pt acknowledged having anxiety about taking the test feeling like she isn't going to pass and not confident in herself.  Pt reports that she would rather just get a ride from friends at this point and reported mom is going to require she start taking the city bus- which she is opposed to. Pt was also able to acknowledge that she is having anxiety about getting a job and wants to overcome for summer job.  Pt aware that cognitive distortions are influencing.  Pt receptive to beginning relaxation training in sessions.  Suicidal/Homicidal: Nowithout intent/plan  Therapist Response: Assessed pt current functioning per pt report.  Processed w/pt her barriers to working towards license and offered support and discussed pt readiness to work towards.  Explored w/pt anxiety experiencing and connection w/ thought patterns and negative self talk.  Discussed w/ pt working on relaxation training and how pt will need to practice.    Plan: Return again in 2 weeks.  Practice breath work/ mediation next session.   Diagnosis: MDD, ADHD   Rewa Weissberg, [facility] 02/13/2015

## 2015-02-27 ENCOUNTER — Ambulatory Visit (INDEPENDENT_AMBULATORY_CARE_PROVIDER_SITE_OTHER): Payer: BLUE CROSS/BLUE SHIELD | Admitting: Psychology

## 2015-02-27 DIAGNOSIS — F330 Major depressive disorder, recurrent, mild: Secondary | ICD-10-CM

## 2015-02-27 NOTE — Progress Notes (Signed)
   THERAPIST PROGRESS NOTE  Session Time: 2.27pm-3.10pm  Participation Level: Active  Behavioral Response: Well GroomedAlert, AFFECT wNL  Type of Therapy: Individual Therapy  Treatment Goals addressed: Diagnosis: MDD and goal 1.  Interventions: CBT and Supportive  Summary: Wendy Levy is a 19 y.o. female who presents with full and bright affect.  Pt reported that she is doing well with school- continuing to keep up to date w/ school work.  Pt did report frustration of lossing her research paper when flash drive quit.  Pt reported that she is able to work on over weekend to recreate in time to turn in.  Pt feels confident about continuing to do well through exams- which are the first week of May. Pt reported she had a good Easter spending w/ mom and her sister. Pt shared her art memory book she created with pictures and writing important to her.  Pt reported she is dating guy who has been friend for years. Pt reported not able to meet up w/ each other because no transportation for either. Pt reported still test anxiety about drivers license- but not ready to work towards. Pt expressed that she is sleeping well and her mood has been good.    Suicidal/Homicidal: Nowithout intent/plan  Therapist Response: Assessed pt current functioning per pt report. Explored w/ pt progress w/ college semester and pt efforts to continue to meet her goals.  Processed w/ pt interactions w/ family and friends.  Explored w/ pt readiness to work towards Management consultant.    Plan: Return again in 2 weeks.  Diagnosis: MDD    Jan Fireman, [employer] 02/27/2015

## 2015-03-02 ENCOUNTER — Other Ambulatory Visit: Payer: BC Managed Care – PPO | Admitting: Family Medicine

## 2015-03-02 DIAGNOSIS — Z118 Encounter for screening for other infectious and parasitic diseases: Secondary | ICD-10-CM

## 2015-03-05 LAB — CHLAMYDIA TRACHOMATIS, PROBE AMP: CT Probe RNA: POSITIVE — AB

## 2015-03-05 MED ORDER — AZITHROMYCIN 1 G PO PACK: 1.0000 g | PACK | Freq: Once | ORAL | Status: DC

## 2015-03-05 NOTE — Addendum Note (Signed)
Addended by: Fara Chute on: 03/05/2015 01:04 PM   Modules accepted: Orders, SmartSet

## 2015-03-06 ENCOUNTER — Encounter: Payer: Self-pay | Admitting: Physician Assistant

## 2015-03-06 ENCOUNTER — Encounter: Payer: Self-pay | Admitting: Family Medicine

## 2015-03-07 ENCOUNTER — Telehealth: Payer: Self-pay | Admitting: Family Medicine

## 2015-03-07 NOTE — Telephone Encounter (Signed)
Fax over information to [facility]

## 2015-03-08 ENCOUNTER — Encounter: Payer: Self-pay | Admitting: Physician Assistant

## 2015-03-11 ENCOUNTER — Ambulatory Visit (HOSPITAL_COMMUNITY): Payer: Self-pay | Admitting: Psychiatry

## 2015-03-12 ENCOUNTER — Ambulatory Visit (INDEPENDENT_AMBULATORY_CARE_PROVIDER_SITE_OTHER): Payer: BLUE CROSS/BLUE SHIELD | Admitting: Psychology

## 2015-03-12 ENCOUNTER — Encounter: Payer: Self-pay | Admitting: Family Medicine

## 2015-03-12 DIAGNOSIS — F330 Major depressive disorder, recurrent, mild: Secondary | ICD-10-CM

## 2015-03-12 NOTE — Progress Notes (Signed)
   THERAPIST PROGRESS NOTE  Session Time: 2:30pm-3.20pm  Participation Level: Active  Behavioral Response: Well GroomedAlert, AFFECT WNL  Type of Therapy: Individual Therapy  Treatment Goals addressed: Diagnosis: MDD and goal 1.  Interventions: CBT and Supportive  Summary: Wendy Levy is a 19 y.o. female who presents with affect WNL.  Pt asks her mom to join session today as they discussed doing.  Pt reported that she is continuing to do well w/ school.  Pt reports she is getting to bed earlier- but at time too early- 8:30-9pm and still tired.  Pt and mom discussed that they had recent argument about driving.  Mom reported pt expressed feeling inadequate about passing driver's license test and mom not sure how else to support her.  Pt did express that she is having test anxiety and expressed self defeating statements about passing written test.  Pt was able to acknowledge and reframe w/ that avoidance and negative thoughts are contributing to failure cycle.  Mom was encouraging w/ pt focusing on her strengths- she had prepared well in December, pt strengths in hands on part, validated feelings-and pt reframe of ability to try again w/out focus on passing.   Pt agreed and discussed how she could go and just focus on taking again.  Pt and mom also discussed need to meet w/ advisor to identify next steps to taking a math course.  Pt also informed mom ankle bothering her still and wants to get checked out- mom discussed how she will contact PCP through mychart later to get next step.  Pt took initiative in session using my chart and contacted her PCP.     Suicidal/Homicidal: Nowithout intent/plan  Therapist Response: Assessed pt current functioning per pt and parent report.  Faciliated communication between pt and parent and assisted pt in expressing her feelings.  Discussed negative thought process and avoidance and effect on pt cycle of success vs. Failure.  Encouraged pt supportive self  statements and from supports to take the written test again w/out focus on passing but to just overcome avoidance.   Plan: Return again in 2 weeks.  Diagnosis: MDD, ADHD    Wendy Levy, [facility] 03/12/2015

## 2015-03-14 ENCOUNTER — Telehealth (HOSPITAL_COMMUNITY): Payer: Self-pay | Admitting: *Deleted

## 2015-03-14 NOTE — Telephone Encounter (Signed)
Received refill request from [facility] for patient's Fluoxetine.  Last time medication was sent was 05-28-14. Last note in March with Dr. [name] stated patient to continue Fluoxetine.  I left message on patient's cell vm needing a call back. Patient stated she has enough Fluoxetine to get her til appointment on 03-18-2015. Patient last time medication was sent was 10-10-14, 90 day script.   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  Dysthymic disorder: Continue Prozac 20 MG one in the morning for dysthymia Oppositional defiant disorder: Mom reports that the patient is doing well with her behavior Medication management with supportive therapy. Risks/benefits and SE of the medication discussed. Pt verbalized understanding and verbal consent obtained for treatment. Affirm with the patient that the medications are taken as ordered. Patient expressed understanding of how their medications were to be used.  -improvement of symptoms  Labs 02/12/2014 CBC and CMP WNL, chol and tryglycerides and ldl elevated, HbA1c WNL   Therapy: brief supportive therapy provided. Discussed psychosocial stressors in detail.  encouraged to continue individual counseling  Pt denies SI and is at an acute low risk for suicide.Patient told to call clinic if any problems occur. Patient advised to go to ER if they should develop SI/HI, side effects, or if symptoms worsen. Has crisis numbers to call if needed. Pt verbalized understanding.  F/up in 3 months or sooner if needed  [name], MD

## 2015-03-18 ENCOUNTER — Ambulatory Visit (INDEPENDENT_AMBULATORY_CARE_PROVIDER_SITE_OTHER): Payer: BLUE CROSS/BLUE SHIELD | Admitting: Psychiatry

## 2015-03-18 ENCOUNTER — Encounter (HOSPITAL_COMMUNITY): Payer: Self-pay | Admitting: Psychiatry

## 2015-03-18 VITALS — BP 131/68 | HR 78 | Ht 64.25 in | Wt 260.4 lb

## 2015-03-18 DIAGNOSIS — F902 Attention-deficit hyperactivity disorder, combined type: Secondary | ICD-10-CM

## 2015-03-18 DIAGNOSIS — F900 Attention-deficit hyperactivity disorder, predominantly inattentive type: Secondary | ICD-10-CM

## 2015-03-18 DIAGNOSIS — F341 Dysthymic disorder: Secondary | ICD-10-CM

## 2015-03-18 DIAGNOSIS — F913 Oppositional defiant disorder: Secondary | ICD-10-CM

## 2015-03-18 MED ORDER — FLUOXETINE HCL 20 MG PO CAPS: 20.0000 mg | ORAL_CAPSULE | Freq: Every day | ORAL | Status: DC

## 2015-03-18 MED ORDER — LISDEXAMFETAMINE DIMESYLATE 50 MG PO CAPS: 50.0000 mg | ORAL_CAPSULE | ORAL | Status: DC

## 2015-03-18 NOTE — Progress Notes (Signed)
Patient ID: Solomiya Pascale, female   DOB: 1996-06-27, 19 y.o.   MRN: [id number] [place] [address] Progress Note  Lilou Kneip [id number] 19 y.o.   Chief Complaint: "good"  History of Present Illness: Patient is a 19 year old diagnosed with ADHD combined type, Dysthmia and ODD who presents today for a followup visit with her mother.  Pt states Vyvanse is helping a lot. She is not getting as distracted and she more goal oriented. Pt is able to meet deadlines. Pt is doing well in school. Denies HA, GI upset and mood changes.Reports mild appetite suppression and she is no longer snacking or eating huge portions.   Irritability has resolved.   Pt denies depression and sad mood. Pt has made a new friend. Denies crying spells, worthlessness, hopelessness, isolation and anhedonia. Pt is no longer playing a lot of video games to escape.   Pt is having broken sleep. Energy is good and she is no longer napping but she does lie down in bed alot.    Anxiety is decreased as compared to before. Anxiety is more situational.   Denies AVH. Denies irritability, grandious thoughts, compulsive or new/change in substance use.   Denies SE from meds.    Mom has no concerns and states pt has been doing really well. Denies any behavorial issues.   Suicidal Ideation: No Plan Formed: No Patient has means to carry out plan: No  Homicidal Ideation: No Plan Formed: No Patient has means to carry out plan: No  Review of Systems:  Review of Systems  Constitution: Negative for chills, decreased appetite, fever, weakness, night sweats and weight gain.  HENT: Positive for headaches. Negative for congestion, ear pain, hearing loss, nosebleeds and sore throat.   Eyes: Negative for blurred vision, pain and photophobia.  Cardiovascular: Negative for chest pain, leg swelling and palpitations.  Respiratory: Positive for snoring. Negative for cough, shortness of breath and wheezing.   Endocrine: Negative.    Musculoskeletal: Positive for joint pain. Negative for back pain, falls, muscle weakness, myalgias and neck pain.  Gastrointestinal: Negative for bloating, abdominal pain, constipation, diarrhea, nausea and vomiting.  Neurological: Negative for aphonia, brief paralysis, difficulty with concentration, excessive daytime sleepiness, numbness and seizures.  Psychiatric/Behavioral: Negative for altered mental status, depression, hallucinations, hypervigilance, memory loss, substance abuse, suicidal ideas and thoughts of violence. The patient has insomnia. The patient is not nervous/anxious.       Past Medical Family, Social History: Patient lives with her adoptive mom. Patient [facility]  reports that she has never smoked. She has never used smokeless tobacco. She reports that she does not drink alcohol or use illicit drugs.  Family History  Problem Relation Age of Onset  . Adopted: Yes  . Depression Sister     reportedly in and out of treatment facilities, also dx w/ RAD  . Suicidality Sister     Past Medical History  Diagnosis Date  . ADHD (attention deficit hyperactivity disorder)   . Depression   . Oppositional defiant disorder   . Wrist fracture, bilateral   . Shoulder dislocation     bilateral  . Allergy   . Anxiety      Outpatient Encounter Prescriptions as of 03/18/2015  Medication Sig  . cetirizine (ZYRTEC) 10 MG tablet Take 10 mg by mouth daily as needed for allergies.  Marland Kitchen FLUoxetine (PROZAC) 20 MG capsule Take 1 capsule (20 mg total) by mouth daily.  Marland Kitchen levonorgestrel-ethinyl estradiol (AVIANE,ALESSE,LESSINA) 0.1-20 MG-MCG tablet Take 1 tablet by mouth daily.  Marland Kitchen  azithromycin (ZITHROMAX) 1 G powder Take 1 packet (1 g total) by mouth once. (Patient not taking: Reported on 03/18/2015)  . lisdexamfetamine (VYVANSE) 50 MG capsule Take 1 capsule (50 mg total) by mouth every morning.    Past Psychiatric History/Hospitalization(s): Anxiety: No Bipolar Disorder: No Depression:  Yes Mania: No Psychosis: No Schizophrenia: No Personality Disorder: No Hospitalization for psychiatric illness: No History of Electroconvulsive Shock Therapy: No Prior Suicide Attempts: No  Physical Exam: Constitutional:  BP 131/68 mmHg  Pulse 78  Ht 5' 4.25" (1.632 m)  Wt 260 lb 6.4 oz (118.117 kg)  BMI 44.35 kg/m2  General Appearance: alert, oriented, no acute distress and obese  Musculoskeletal: Strength & Muscle Tone: within normal limits Gait & Station: normal Patient leans: N/A   Mental Status Examination/Evaluation: Objective: Attitude: Calm and cooperative  Appearance: Casual and orange hair, appears to be stated age  Eye Contact::  Fair  Speech:  Clear and Coherent, Normal Rate and spontaneous  Volume:  Normal  Mood:  euthymic  Affect:  Appropriate  Thought Process:  Intact, Linear and Logical  Orientation:  Full (Time, Place, and Person)  Thought Content:  Negative  Suicidal Thoughts:  No  Homicidal Thoughts:  No  Judgement:  Fair  Insight:  Fair  Concentration: good  Memory: Immediate-intact Recent-intact Remote-intact  Recall: fair  Language: fair  Gait and Station: normal  [employer] of Knowledge: average  Psychomotor Activity:  Normal  Akathisia:  No  Handed:  Right  AIMS (if indicated): n/a  Assets:  Armed forces logistics/support/administrative officer Desire for Improvement Stage manager (Choose Three): Established Problem, Stable/Improving (1), Review of Psycho-Social Stressors (1), Review or order clinical lab tests (1) and Review of Medication Regimen & Side Effects (2)  Assessment: Axis I: ADHD combined type, moderate severity; oppositional defiant disorder; dysthymic disorder  Axis II: Deferred  Axis III: Obese Past Medical History  Diagnosis Date  . ADHD (attention deficit hyperactivity disorder)   . Depression   . Oppositional defiant disorder   . Wrist fracture, bilateral   . Shoulder  dislocation     bilateral  . Allergy   . Anxiety      Axis UD:JSHFWYO coping skills  Axis V: 65 to 70   Plan: ADHD combined type:  Vyvanse  50 mg one in the morning for ADHD inattentive type. If stable may refill for another 90 days. Dysthymic disorder: Continue Prozac 20 MG one in the morning for dysthymia Oppositional defiant disorder: Mom reports that the patient is doing well with her behavior  Medication management with supportive therapy. Risks/benefits and SE of the medication discussed. Pt verbalized understanding and verbal consent obtained for treatment.  Affirm with the patient that the medications are taken as ordered. Patient expressed understanding of how their medications were to be used.  -improvement of symptoms  Labs: 02/05/2015 CBC and CMP WNL, TSH WNL  Therapy: brief supportive therapy provided. Discussed psychosocial stressors in detail.   encouraged to continue individual counseling  Pt denies SI and is at an acute low risk for suicide.Patient told to call clinic if any problems occur. Patient advised to go to ER if they should develop SI/HI, side effects, or if symptoms worsen. Has crisis numbers to call if needed. Pt verbalized understanding.  F/up in 6 months or sooner if needed  [name], MD

## 2015-03-26 ENCOUNTER — Ambulatory Visit (INDEPENDENT_AMBULATORY_CARE_PROVIDER_SITE_OTHER): Payer: BLUE CROSS/BLUE SHIELD | Admitting: Psychology

## 2015-03-26 DIAGNOSIS — F330 Major depressive disorder, recurrent, mild: Secondary | ICD-10-CM

## 2015-03-26 DIAGNOSIS — F902 Attention-deficit hyperactivity disorder, combined type: Secondary | ICD-10-CM

## 2015-03-26 NOTE — Progress Notes (Signed)
   THERAPIST PROGRESS NOTE  Session Time: 2.30pm-3.15pm  Participation Level: Active  Behavioral Response: Well GroomedAlertaffect WNL  Type of Therapy: Individual Therapy  Treatment Goals addressed: Diagnosis: MDD, ADHD and goal 1.  Interventions: Strength-based, Counsellor and Supportive  Summary: Wendy Levy is a 19 y.o. female who presents with affect WNL.  Pt reported that she is doing good w/her sleep schedule, engaging in artistic and creative pursuits for relaxation and stress management.  Pt reported she had a good birthday w/ family and friends.  Pt reports she has one more class and final exam in Spanish.  Pt is concerned about her grade which is currently a 67. Pt reported last week when to work on her assignments due and system not working so received a zero and professor wasn't willing to give any flexibility on.  Pt reported she is going to focus on doing what she can between last assignments and exam to pass.  Pt reports registered for classes next semester: Spanish 112, Eng 112, communications 110 and intro to computers.  Pt still unclear about direction w/ studies. Pt reported she has been studying again for driver's license exam and feeling more prepared to take exam.  Pt did report that she spent her birthday money on friends as she felt pressured to purchase items the insisted on. Pt reported that she aware of need to set boundaries and okay to say no. Pt also reported don't have to inform of spending money.    Suicidal/Homicidal: Nowithout intent/plan  Therapist Response: ASsessed pt current functioning per pt report.  Explored w/pt recent stressors and assisted pt in focusing on how to prepare.  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  Processed w/ pt asserting herself w/ friends and ways she can do this to keep healthy boundaries.   Plan: Return again in 3 weeks.  Diagnosis: MDD and  ADHD   Toni Demo, [facility] 03/26/2015

## 2015-04-24 ENCOUNTER — Ambulatory Visit (INDEPENDENT_AMBULATORY_CARE_PROVIDER_SITE_OTHER): Payer: BC Managed Care – PPO

## 2015-04-24 ENCOUNTER — Ambulatory Visit (INDEPENDENT_AMBULATORY_CARE_PROVIDER_SITE_OTHER): Payer: BC Managed Care – PPO | Admitting: Family Medicine

## 2015-04-24 VITALS — BP 120/68 | HR 90 | Temp 99.1°F | Resp 18 | Ht 64.25 in | Wt 258.0 lb

## 2015-04-24 DIAGNOSIS — M25572 Pain in left ankle and joints of left foot: Secondary | ICD-10-CM

## 2015-04-24 DIAGNOSIS — L83 Acanthosis nigricans: Secondary | ICD-10-CM | POA: Diagnosis not present

## 2015-04-24 DIAGNOSIS — E669 Obesity, unspecified: Secondary | ICD-10-CM | POA: Diagnosis not present

## 2015-04-24 DIAGNOSIS — R519 Headache, unspecified: Secondary | ICD-10-CM

## 2015-04-24 DIAGNOSIS — R51 Headache: Secondary | ICD-10-CM | POA: Diagnosis not present

## 2015-04-24 NOTE — Progress Notes (Signed)
[facility] [address] [place] [address] [phone]  Date:  04/24/2015   Name:  Wendy Levy   DOB:  10-Jun-1996   MRN:  [id number]  PCP:  Lamar Blinks, MD    Chief Complaint: Ankle Pain and Headache   History of Present Illness:  Wendy Levy is a 19 y.o. very pleasant female patient who presents with the following:  About one year ago she twisted her left ankle.  She was told that she had a "floating bone chip;" it looks like she had a talar avulsion.   She did PT and got better for a time.  However off and on over the last month it has hurt more.  She fell on it on a trampoline and may have re-injured it.  She does her PT exercises at home sometimes such as stretching her achilles.    The right ankle is ok Also she has noted headaches in the morning. She may wake up with them some days.  They usually start behind her eyes and then go up to her scalp.  She has noticed this over the last 1-2 weeks.  She has awoken with a HA for the last 3 days.  Ibuprofen or tylenol does help.  Taking an afternoon nap may also help some.   She drinks diet soda- however not until noon or so.  She often wakes up around 11 am.   She notes some nasal mucus but no other sx of allergies at his time.    Wt Readings from Last 3 Encounters:  04/24/15 258 lb (117.028 kg)  03/18/15 260 lb 6.4 oz (118.117 kg) (100 %*, Z = 2.61)  02/05/15 255 lb 9.6 oz (115.939 kg) (99 %*, Z = 2.56)   * Growth percentiles are based on CDC 2-20 Years data.    Patient Active Problem List   Diagnosis Date Noted  . BMI 40.0-44.9, adult 02/05/2015  . Migraine, unspecified, without mention of intractable migraine without mention of status migrainosus 03/15/2013  . Major depressive disorder, recurrent episode 01/31/2012  . ADHD (attention deficit hyperactivity disorder), combined type 11/02/2011  . ODD (oppositional defiant disorder) 11/02/2011  . Dysthymic disorder 11/02/2011    Past Medical  History  Diagnosis Date  . ADHD (attention deficit hyperactivity disorder)   . Depression   . Oppositional defiant disorder   . Wrist fracture, bilateral   . Shoulder dislocation     bilateral  . Allergy   . Anxiety     Past Surgical History  Procedure Laterality Date  . Wisdom tooth extraction  04/2013    History  Substance Use Topics  . Smoking status: Never Smoker   . Smokeless tobacco: Never Used  . Alcohol Use: No    Family History  Problem Relation Age of Onset  . Adopted: Yes  . Depression Sister     reportedly in and out of treatment facilities, also dx w/ RAD  . Suicidality Sister     Allergies  Allergen Reactions  . Benadryl Anti-Itch Childrens [Camphor] Rash    Rash with topical product    Medication list has been reviewed and updated.  Current Outpatient Prescriptions on File Prior to Visit  Medication Sig Dispense Refill  . cetirizine (ZYRTEC) 10 MG tablet Take 10 mg by mouth daily as needed for allergies.    Marland Kitchen FLUoxetine (PROZAC) 20 MG capsule Take 1 capsule (20 mg total) by mouth daily. 90 capsule 1  . levonorgestrel-ethinyl estradiol (AVIANE,ALESSE,LESSINA) 0.1-20 MG-MCG  tablet Take 1 tablet by mouth daily. 3 Package 4  . lisdexamfetamine (VYVANSE) 50 MG capsule Take 1 capsule (50 mg total) by mouth every morning. 90 capsule 0  . azithromycin (ZITHROMAX) 1 G powder Take 1 packet (1 g total) by mouth once. (Patient not taking: Reported on 03/18/2015) 1 each 0   No current facility-administered medications on file prior to visit.    Review of Systems:  As per HPI- otherwise negative.   Physical Examination: Filed Vitals:   04/24/15 1403  BP: 120/68  Pulse: 107  Temp: 99.1 F (37.3 C)  Resp: 18   Filed Vitals:   04/24/15 1403  Height: 5' 4.25" (1.632 m)  Weight: 258 lb (117.028 kg)   Body mass index is 43.94 kg/(m^2). Ideal Body Weight: Weight in (lb) to have BMI = 25: 146.5  GEN: WDWN, NAD, Non-toxic, A & O x 3, morbidly  obese HEENT: Atraumatic, Normocephalic. Neck supple. No masses, No LAD.  Bilateral TM wnl, oropharynx normal.  PEERL,EOMI.   Acanthosis nigricans is present Ears and Nose: No external deformity. CV: RRR, No M/G/R. No JVD. No thrill. No extra heart sounds. PULM: CTA B, no wheezes, crackles, rhonchi. No retractions. No resp. distress. No accessory muscle use. EXTR: No c/c/e NEURO Normal gait. Normal strength, sensation and DTR of all extremities PSYCH: Normally interactive. Conversant. Not depressed or anxious appearing.  Calm demeanor.  Left ankle:  Normal exam; no tenderness, swelling or bruise noted   [facility] reading (PRIMARY) by  Dr. Lorelei Pont. Left ankle: healed talar avulsion- OW negative  Placed in a sweed-o which felt good to her  LEFT ANKLE COMPLETE - 3+ VIEW  COMPARISON: Left ankle films of 06/15/2014  FINDINGS: No acute fracture is seen. The ankle joint appears normal and alignment is normal. On the lateral view there is a bony protrusion from the talar beak region which appears to represent prior trauma when compared to films from 06/15/2014  IMPRESSION: No acute abnormality  We were unable to get blood from her despite 3 attempts   Assessment and Plan: Left ankle pain - Plan: DG Ankle Complete Left, Ambulatory referral to Orthopedic Surgery  Acanthosis nigricans - Plan: Hemoglobin A1c, CANCELED: POCT glycosylated hemoglobin (Hb A1C)  Nonintractable episodic headache, unspecified headache type  Obesity - Plan: TSH, CANCELED: TSH, CANCELED: POCT glycosylated hemoglobin (Hb A1C)  Here today with recurrent left ankle pain a year after a sprain and talar avulsion.  She would like to be referred to ortho, also placed her in a sweed-o brace.   Discussed her headaches.  It seems that her morning headaches have been present for just the past 2-3 days.  Last A1c a year ago was normal. She will come back in for blood work another day soon.  If her labs are normal and her sx  continue may need to see neurology.  Consider pseudotumor cerebri given her weight  Signed Lamar Blinks, MD

## 2015-04-24 NOTE — Patient Instructions (Addendum)
Wear the ankle brace and I will get you referred to see an orthopedist for your ankle Come by when you can and we will check your blood levels.   Please keep track of your headaches and let me know if you are getting worse Continue to work on weight loss which will help both your headaches and your ankle pain

## 2015-04-25 ENCOUNTER — Ambulatory Visit (INDEPENDENT_AMBULATORY_CARE_PROVIDER_SITE_OTHER): Payer: BC Managed Care – PPO | Admitting: Psychology

## 2015-04-25 DIAGNOSIS — F330 Major depressive disorder, recurrent, mild: Secondary | ICD-10-CM

## 2015-04-25 NOTE — Progress Notes (Signed)
   THERAPIST PROGRESS NOTE  Session Time: 10.04am-10.50am  Participation Level: Active  Behavioral Response: Fairly GroomedAlert, Tired  Type of Therapy: Individual Therapy  Treatment Goals addressed: Diagnosis: MDD and goal 1.  Interventions: CBT and Strength-based  Summary: Wendy Levy is a 19 y.o. female who presents with report of being tired.  Pt reported that she is going to sleep around 10pm and waking at 11am but still feels tired.  Pt reports her sleep is restless and wakes often.  Pt does report that she snores at night.  Pt reported that she completed her semester with a A, C and D- she will have to retake the Spanish as D doesn't allow her to continue to next level.  Pt reported that she did "bite the bullet" and took her driver's exam.  Pt reported that she was disappointed as didn't pass- but feels good that she went to take and the felt ok doing so.  Pt reported she might wait till mom's out of town because still feels anxious about disappointing mom.  Mom is going on vacation next week. Pt reports she is studying w/ a friend and this is helpful.  Pt reported she is applying for summer jobs but is restricted to areas can walk and refuses to apply to fast food.   Suicidal/Homicidal: Nowithout intent/plan  Therapist Response: ASsessed pt current functioning per her report.  Processed w/ pt overcoming anxiety and taking the drivers exam.  Assisted pt in identifying next step to not return to avoidance.  Explored w/pt her wants for summer and evaluating barriers and how to expand options for more opportunity for job.    Plan: Return again in 3 weeks. Recommended to discuss w/ doctor sleep disturbance and whether needs a sleep study to r/o any other issues.   Diagnosis: MDD, mild.     Jan Fireman, [facility] 04/25/2015

## 2015-05-14 ENCOUNTER — Ambulatory Visit (HOSPITAL_COMMUNITY): Payer: Self-pay | Admitting: Psychology

## 2015-06-11 ENCOUNTER — Ambulatory Visit (INDEPENDENT_AMBULATORY_CARE_PROVIDER_SITE_OTHER): Payer: BC Managed Care – PPO | Admitting: Psychology

## 2015-06-11 DIAGNOSIS — F330 Major depressive disorder, recurrent, mild: Secondary | ICD-10-CM

## 2015-06-11 DIAGNOSIS — F902 Attention-deficit hyperactivity disorder, combined type: Secondary | ICD-10-CM

## 2015-06-11 NOTE — Progress Notes (Signed)
   THERAPIST PROGRESS NOTE  Session Time: 2.30pm-3.16pm  Participation Level: Active  Behavioral Response: Well GroomedAlert, AFFECt WNL  Type of Therapy: Family Therapy  Treatment Goals addressed: Diagnosis: MDD and goal 1.  Interventions: CBT and Supportive  Summary: Ivianna Notch is a 19 y.o. female who presents with her mother as requested by pt.  Pt and mom reported on stressors of mom's medical incident and how pt responded.  Pt was able to talk about her strengths that assisted her through.  Pt reported on ending of relationship w/ close friend and feeling good about her decision as aware unhealthy relationship although will miss times w/ friend.  Pt shared about getting her learner's permit today and felt good about not given up and meeting this goal.  Pt and mom discussed mom trip abroad and how growing experience for both of them living independently.  Pt reported she is taking medication for headaches and receiving physical therapy for her foot.   Suicidal/Homicidal: Nowithout intent/plan  Therapist Response: Assessed pt current functioning per pt and parent report.  Processed w/pt and mom stressors they have experienced and focus on pt ability to cope through stressors successfully and growth she has experienced.   Plan: Return again in 4 weeks.  Diagnosis: MDD   Jan Fireman, [facility] 06/11/2015

## 2015-06-25 ENCOUNTER — Ambulatory Visit (INDEPENDENT_AMBULATORY_CARE_PROVIDER_SITE_OTHER): Payer: BC Managed Care – PPO | Admitting: Physician Assistant

## 2015-06-25 VITALS — BP 128/80 | HR 103 | Temp 98.5°F | Resp 17 | Ht 64.5 in | Wt 250.0 lb

## 2015-06-25 DIAGNOSIS — L309 Dermatitis, unspecified: Secondary | ICD-10-CM

## 2015-06-25 MED ORDER — HYDROXYZINE HCL 25 MG PO TABS: 12.5000 mg | ORAL_TABLET | Freq: Every evening | ORAL | Status: DC | PRN

## 2015-06-25 MED ORDER — TRIAMCINOLONE ACETONIDE 0.1 % EX CREA: 1.0000 "application " | TOPICAL_CREAM | Freq: Two times a day (BID) | CUTANEOUS | Status: DC

## 2015-06-25 NOTE — Progress Notes (Signed)
Patient ID: Wendy Levy, female    DOB: 1996-10-15, 19 y.o.   MRN: [id number]  PCP: Lamar Blinks, MD  Subjective:   Chief Complaint  Patient presents with  . Rash    rash on face     HPI Presents for evaluation of rash. Her mother is in the room with her.   Pt states that she noticed itching on her anterior and posterior neck around 5:30 pm yesterday. Pt went to bed at 7:15 pm and woke up at 4:30 am and noticed that she had a blotchy rash on her face, neck, and chest. She went back to bed and woke up around 8:50 am and then came to urgent care.   Pt reports a stomach bug that started Saturday 7/23. Pt states that she was vomiting on Saturday and Sunday, about 3 times. Her last episode of vomiting was late Sunday night around midnight. She reports decreased appetite since Saturday and denies consumption of new foods.   Pt was put on meloxicam for her L ankle about 4 weeks ago and stopped taking the medication on 06/19/2015 and has been taking tramadol PRN for HA, and has not taken any since the 06/23/2015. She has not taken any other new medication. Pt denies use of any new soaps, lotions, laundry detergents. Pt also denies spending time outdoors since Monday 06/23/2015.   Pt states she is allergic to topical benadryl, which causes a rash. Pt denies SOB, breathing changes. Pt endorses itchiness is areas of rash. She usually takes Zyrtec once daily but has not taken any since feeling sick to her stomach over the weekend. Pt reports that she is UTD on her vaccinations, confirmed by chart review.     Review of Systems As above. No eye or ear symptoms. No sinus/nasal symptoms. No tongue, lip or throat symptoms. No new/different HA or dizziness. No cough. No fever/chills.     Patient Active Problem List   Diagnosis Date Noted  . BMI 40.0-44.9, adult 02/05/2015  . Migraine, unspecified, without mention of intractable migraine without mention of status migrainosus 03/15/2013  . Major  depressive disorder, recurrent episode 01/31/2012  . ADHD (attention deficit hyperactivity disorder), combined type 11/02/2011  . ODD (oppositional defiant disorder) 11/02/2011  . Dysthymic disorder 11/02/2011     Prior to Admission medications   Medication Sig Start Date End Date Taking? Authorizing Provider  cetirizine (ZYRTEC) 10 MG tablet Take 10 mg by mouth daily as needed for allergies.   Yes Historical Provider, MD  FLUoxetine (PROZAC) 20 MG capsule Take 1 capsule (20 mg total) by mouth daily. 03/18/15  Yes [name], MD  levonorgestrel-ethinyl estradiol (AVIANE,ALESSE,LESSINA) 0.1-20 MG-MCG tablet Take 1 tablet by mouth daily. 02/05/15  Yes Margretta Zamorano, PA-C  lisdexamfetamine (VYVANSE) 50 MG capsule Take 1 capsule (50 mg total) by mouth every morning. 03/18/15 06/25/15 Yes [name], MD  traMADol (ULTRAM) 50 MG tablet Take by mouth every 6 (six) hours as needed.   Yes Historical Provider, MD  meloxicam (MOBIC) 7.5 MG tablet Take 7.5 mg by mouth daily.    Historical Provider, MD     Allergies  Allergen Reactions  . Benadryl Anti-Itch Childrens [Camphor] Rash    Rash with topical product       Objective:  Physical Exam  Constitutional: She is oriented to person, place, and time. She appears well-developed and well-nourished. She is active and cooperative. No distress.  BP 128/80 mmHg  Pulse 103  Temp(Src) 98.5 F (36.9 C) (Oral)  Resp  17  Ht 5' 4.5" (1.638 m)  Wt 250 lb (113.399 kg)  BMI 42.27 kg/m2  SpO2 98%  LMP 06/11/2015   Eyes: Conjunctivae are normal.  Pulmonary/Chest: Effort normal.  Neurological: She is alert and oriented to person, place, and time.  Skin: Skin is warm, dry and intact. Rash noted. Rash is maculopapular. She is not diaphoretic. No pallor.  Maculopapular lesions are small, but coalesce on the face reminiscent of an alar rash. Similar lesions on the RIGHT upper chest/lower neck and posterior LEFT neck.  Psychiatric: She has a normal mood  and affect. Her speech is normal and behavior is normal.           Assessment & Plan:   1. Dermatitis Unclear etiology. Appears reactive. Supportive care. Try to stay cool and dry. OTC cetirizine daily. - hydrOXYzine (ATARAX/VISTARIL) 25 MG tablet; Take 0.5-1 tablets (12.5-25 mg total) by mouth at bedtime as needed for itching.  Dispense: 20 tablet; Refill: 0 - triamcinolone cream (KENALOG) 0.1 %; Apply 1 application topically 2 (two) times daily.  Dispense: 45 g; Refill: 0  Return if symptoms worsen or fail to improve.   Fara Chute, PA-C Physician Assistant-Certified [facility]

## 2015-06-25 NOTE — Progress Notes (Signed)
   Subjective:    Patient ID: Wendy Levy, female    DOB: 1996/05/25, 19 y.o.   MRN: [id number]  HPI Pt presents to [facility] with cc of rash. Her mother is in the room with her. Pt states that she noticed itching on her anterior and posterior neck around 5:30 pm yesterday. Pt went to bed at 7:15 pm and woke up at 4:30 pm and noticed that she had a blotchy rash on her face, neck, and chest. She went back to bed and woke up around 8:50 am and then came to urgent care. Pt reports a stomach bug that started Saturday. Pt states that she was vomiting on Saturday and Sunday, about 3 times. Her last episode of vomiting was late Sunday night around midnight. She reports decreased appetite since Saturday and denies consumption of new foods. Pt was put on meloxicam for her L ankle about 4 weeks ago and stopped taking the medication on 06/19/2015 and has been taking tramadol PRN for HA, and has not taken any since the 06/23/2015. She has not taken any other new medication. Pt denies use of any new soaps, lotions, laundry detergents. Pt also denies spending time outdoors since Monday 06/23/2015. Pt states she is allergic to topical benadryl, which causes a rash. Pt denies SOB, breathing changes. Pt endorses itchiness is areas of rash. She usually takes Zyrtec once daily but has not taken any since feeling sick to her stomach over the weekend. Pt reports that she is UTD on her vaccinations.  Review of Systems  Constitutional: Negative for fever.  Eyes: Negative.   Respiratory: Negative.   Cardiovascular: Negative.   Gastrointestinal: Positive for vomiting. Negative for abdominal pain, diarrhea and blood in stool.  Genitourinary: Negative.   Skin: Positive for rash.  Neurological: Positive for headaches.       Objective:   Physical Exam  Constitutional: She is oriented to person, place, and time. She appears well-developed and well-nourished. No distress.  HENT:  Head: Normocephalic and atraumatic.  Right Ear:  External ear normal.  Left Ear: External ear normal.  Mouth/Throat: Oropharynx is clear and moist.  Cardiovascular: Normal rate, regular rhythm, normal heart sounds and intact distal pulses.  Exam reveals no gallop and no friction rub.   No murmur heard. Pulmonary/Chest: Effort normal and breath sounds normal. No respiratory distress. She has no wheezes. She has no rales.  Neurological: She is alert and oriented to person, place, and time.  Skin: Skin is warm and dry. Rash noted. She is not diaphoretic.  Psychiatric: She has a normal mood and affect. Her behavior is normal. Thought content normal.      Assessment & Plan:  1. Dermatitis - hydrOXYzine (ATARAX/VISTARIL) 25 MG tablet; Take 0.5-1 tablets (12.5-25 mg total) by mouth at bedtime as needed for itching.  Dispense: 20 tablet; Refill: 0 - triamcinolone cream (KENALOG) 0.1 %; Apply 1 application topically 2 (two) times daily.  Dispense: 45 g; Refill: 0 - continue taking Zyrtec 10 mg tablet once daily

## 2015-06-25 NOTE — Patient Instructions (Signed)
Take the Atarax (hydroxyzine) at bedtime, as it causes drowsiness. You may use an over-the-counter antihistamine (Claritin, Zyrtec, Allegra) for daytime symptoms, as well as over-the-counter hydrocortisone cream and/or calamine lotion. Stay as cool and dry as you can, as getting hot and sweaty (or taking a very hot shower) can increase the itching.

## 2015-06-27 ENCOUNTER — Encounter: Payer: Self-pay | Admitting: Physician Assistant

## 2015-07-02 ENCOUNTER — Telehealth (HOSPITAL_COMMUNITY): Payer: Self-pay

## 2015-07-02 ENCOUNTER — Ambulatory Visit (INDEPENDENT_AMBULATORY_CARE_PROVIDER_SITE_OTHER): Payer: BC Managed Care – PPO | Admitting: Psychology

## 2015-07-02 DIAGNOSIS — F900 Attention-deficit hyperactivity disorder, predominantly inattentive type: Secondary | ICD-10-CM

## 2015-07-02 DIAGNOSIS — F330 Major depressive disorder, recurrent, mild: Secondary | ICD-10-CM

## 2015-07-02 NOTE — Progress Notes (Signed)
   THERAPIST PROGRESS NOTE  Session Time: 2.30pm-3.05pm  Participation Level: Active  Behavioral Response: Well GroomedAlert, aFFECT WNL  Type of Therapy: Individual Therapy  Treatment Goals addressed: Diagnosis: MDD and goal 1.  Interventions: CBT and Strength-based  Summary: Wendy Levy is a 19 y.o. female who presents with affect WNL.  Pt reported that she has been doing well w/ her mood. pt reported she has been practicing driving to and from [facility] and reports she will be starting back in 2 weeks. Pt reports that she is looking forward to getting out of the house.  Pt reported that she had been sick w/ stomach virus last week and rash. Pt reported that she is still dating boyfriend and will be getting together next week. Pt reports she has maintained contact w/ her sister although she had to move back in w/ family as couldn't afford an apartment.  Pt reported she has been enjoying time outdoors and w/ pets.  Pt denies any stressors.  Suicidal/Homicidal: Nowithout intent/plan  Therapist Response: Assessed pt current functioning per pt report.  Processed w/ pt her mood and self care and activities staying engaged with.  Explored upcoming transition back to school and prepping for return to school work.    Plan: Return again in after attending school for 2 weeks.  Diagnosis: MDD    Jan Fireman, [facility] 07/02/2015

## 2015-07-07 MED ORDER — LISDEXAMFETAMINE DIMESYLATE 50 MG PO CAPS: 50.0000 mg | ORAL_CAPSULE | ORAL | Status: DC

## 2015-07-07 NOTE — Telephone Encounter (Signed)
Patient's Mother called and requested a new 90 day Vyvanse order for patient who will return to see Dr. [name] on 09/18/15.  Patient last seen 03/18/15 with plan to return in 6 months and to obtain a new 90 day order once needed if no concerns at that time.  Met with Dr. Dwyane Dee who authorized a new 90 day order.  New order printed, reviewed and signed by Dr. Dwyane Dee.  Called and left a message on patient's voicemail the order was prepared for pick up.

## 2015-07-08 ENCOUNTER — Telehealth (HOSPITAL_COMMUNITY): Payer: Self-pay

## 2015-07-08 NOTE — Telephone Encounter (Signed)
07/08/15 3:36pm Patient came and pick-up rx script #[id number].Wendy KitchenMariana Kaufman

## 2015-07-09 ENCOUNTER — Telehealth (HOSPITAL_COMMUNITY): Payer: Self-pay | Admitting: *Deleted

## 2015-07-09 NOTE — Telephone Encounter (Signed)
Prior authorization for Vyvanse received. Called [phone] spoke with [facility] who gave approval #[id number] for 06/18/15-07/08/16. Pharmacy notified.

## 2015-07-24 ENCOUNTER — Ambulatory Visit (INDEPENDENT_AMBULATORY_CARE_PROVIDER_SITE_OTHER): Payer: BC Managed Care – PPO | Admitting: Psychology

## 2015-07-24 DIAGNOSIS — F330 Major depressive disorder, recurrent, mild: Secondary | ICD-10-CM

## 2015-07-24 NOTE — Progress Notes (Signed)
   THERAPIST PROGRESS NOTE  Session Time: 2.30pm-3.08pm  Participation Level: Active  Behavioral Response: Well GroomedAlertTired  Type of Therapy: Individual Therapy  Treatment Goals addressed: Diagnosis: MDD and goal 1.  Interventions: CBT, Strength-based and Other: boundaries  Summary: Wendy Levy is a 19 y.o. female who presents with report of being very tired.  Pt reported that she has been relaxing today.  Pt started classes last week and reports courses are going well and she classes are fun.  Pt reported english is her heaviest work load but she completed a paper early this week.  Pt disclosed that her bio dad sent a letter from jail and shared the letter.  Pt good insight that the letter isn't reflective of their relationship (return to the "good old day's) and that dad expectations aren't realistic (writing him daily, being an active presence in her life).  Pt reports she will write him back- but not often and that she will keep to just basics not one reflective of a close relationship.    Suicidal/Homicidal: Nowithout intent/plan  Therapist Response: Assessed pt current functioning per pt report.  Explored w/ pt transition to new semester.  Reiterated pt using good time management and organization for success.  Processed w/pt letter received from dad- themes in letter, pt insights about and pt response.  Discussed how her insights and response are reflective of good boundaries.    Plan: Return again in 3 weeks.  Diagnosis: MDD     Jan Fireman, [facility] 07/24/2015

## 2015-08-11 ENCOUNTER — Ambulatory Visit (INDEPENDENT_AMBULATORY_CARE_PROVIDER_SITE_OTHER): Payer: BC Managed Care – PPO | Admitting: Family Medicine

## 2015-08-11 VITALS — BP 106/70 | HR 115 | Temp 98.1°F | Resp 16 | Ht 64.5 in | Wt 254.0 lb

## 2015-08-11 DIAGNOSIS — H6522 Chronic serous otitis media, left ear: Secondary | ICD-10-CM | POA: Diagnosis not present

## 2015-08-11 DIAGNOSIS — H938X2 Other specified disorders of left ear: Secondary | ICD-10-CM

## 2015-08-11 DIAGNOSIS — J301 Allergic rhinitis due to pollen: Secondary | ICD-10-CM | POA: Diagnosis not present

## 2015-08-11 MED ORDER — FLUTICASONE PROPIONATE 50 MCG/ACT NA SUSP: 2.0000 | Freq: Every day | NASAL | Status: DC

## 2015-08-11 NOTE — Progress Notes (Deleted)
Subjective:  This chart was scribed for Delman Cheadle, MD by Thea Alken, ED Scribe. This patient was seen in room 3 and the patient's care was started at 5:14 PM.   Patient ID: Wendy Levy, female    DOB: [birth year], 19 y.o.   MRN: [id number]  HPI   Chief Complaint  Patient presents with   "Feels like something is crwling in left ear"    on and off x 2 weeks    HPI Comments: Wendy Levy is a 19 y.o. female who presents to the [facility] complaining of intermittent left ear tingling. For the past 2 weeks, pt has had tingling sensation and spasm to the entrance of her left ear. Sensation is not triggered with certain positions, eating or time a day. Pt cleans her ears with q-tips. She takes zyrtec in the morning and sleep aid but denies any other or new medication.  Pt denies swelling, discharge or pain in left ear as well as hearing loss. Pt admits to grinding her teeth but does not wear a night guard. She is seen by dentist every 6 months. No cold symptoms but has had nosebleeds.   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 Authorizing Provider  cetirizine (ZYRTEC) 10 MG tablet Take 10 mg by mouth daily as needed for allergies.   Yes Historical Provider, MD  FLUoxetine (PROZAC) 20  MG capsule Take 1 capsule (20 mg total) by mouth daily. 03/18/15  Yes [name], MD  levonorgestrel-ethinyl estradiol (AVIANE,ALESSE,LESSINA) 0.1-20 MG-MCG tablet Take 1 tablet by mouth daily. 02/05/15  Yes Chelle Jeffery, PA-C  lisdexamfetamine (VYVANSE) 50 MG capsule Take 1 capsule (50 mg total) by mouth every morning. 07/07/15 10/14/15 Yes Hampton Abbot, MD   Depression screen [facility] 2/9 08/11/2015 06/25/2015 10/16/2014  Decreased Interest 0 0 1  Down, Depressed, Hopeless 0 0 1  PHQ - 2 Score 0 0 2  Altered sleeping - - 0  Tired, decreased energy - - 3  Change in appetite - - 1  Feeling bad or failure about yourself  - - 0  Trouble concentrating - - 0  Moving slowly or fidgety/restless - - 0  Suicidal thoughts - - 0  PHQ-9 Score - - 6     Review of Systems  Constitutional: Negative for fever, chills, activity change and unexpected weight change.  HENT: Positive for dental problem. Negative for congestion, ear discharge, ear pain, facial swelling, hearing loss, mouth sores, rhinorrhea, sinus pressure, sneezing, sore throat, tinnitus, trouble swallowing and voice change.   Eyes: Negative for photophobia, pain, discharge, redness, itching and visual disturbance.  Musculoskeletal: Negative for neck pain and neck stiffness.  Skin: Negative for color change and rash.  Neurological: Positive for facial asymmetry.  Hematological: Negative for adenopathy.  Psychiatric/Behavioral: Negative for sleep disturbance and  dysphoric mood. The patient is not nervous/anxious.    Objective:   Physical Exam  Constitutional: She is oriented to person, place, and time. She appears well-developed and well-nourished. No distress.  HENT:  Head: Normocephalic and atraumatic.  Mouth/Throat: Oropharynx is clear and moist. No oropharyngeal exudate, posterior oropharyngeal edema or posterior oropharyngeal erythema.  Small amount of cerumen in left ear. TM retracted with chronic mid ear effusion. Does no appear to  be injected or acutely infected.   Eyes: Conjunctivae and EOM are normal.  Neck: Neck supple.  Cardiovascular: Regular rhythm and normal heart sounds.  Tachycardia present.   Pulmonary/Chest: Effort normal and breath sounds normal. No respiratory distress. She has no wheezes. She has no rales. She exhibits no tenderness.  Musculoskeletal: Normal range of motion.  Neurological: She is alert and oriented to person, place, and time.  Skin: Skin is warm and dry.  Psychiatric: She has a normal mood and affect. Her behavior is normal.  Nursing note and vitals reviewed.  Filed Vitals:   08/11/15 1702  BP: 106/70  Pulse: 115  Temp: 98.1 F (36.7 C)  TempSrc: Oral  Resp: 16  Height: 5' 4.5" (1.638 m)  Weight: 254 lb (115.214 kg)  SpO2: 98%   Assessment & Plan:  . She has been advised OTC debrox to help with cerumen build up and lavage of ear for mid ear effusion.     1. Abnormal ear sensation, left   2. Left chronic serous otitis media   3. Allergic rhinitis due to pollen   Unknown etiology of new left external ear tingling - pt compares it to  blepharospasm but cannot ever see any movement - just feels like tragus area.  I think most likely this is from her known teeth grinding causing TMJ type sxs so restart otc mouthguard. We can consider muscle relaxer for TMJ, over night if this becomes more prominent.  Try to minimize/eliminate ear bud/head phone/phone use on the left side.  Can try top peroxide.  Also try nasal steroid to improve allegic rhinitis/ETD   Meds ordered this encounter  Medications   fluticasone (FLONASE) 50 MCG/ACT nasal spray    Sig: Place 2 sprays into both nostrils at bedtime.    Dispense:  16 g    Refill:  1    I personally performed the services described in this documentation, which was scribed in my presence. The recorded information has been reviewed and considered, and addended by me as needed.  Delman Cheadle, MD MPH

## 2015-08-11 NOTE — Patient Instructions (Addendum)
I would wonder if you are having spasms of your TMJ muscle.  Start wearing a bite guard every night just to see if it helps your symptoms get better.  Hopefully over the next few weeks this goes away completely.  I would recommend getting plenty of rest and trying not to lean against or compress that ear. Do not wear any earphones/earbuds and make sure you use speaker phone or the right ear when on the phone. Drink plenty of water. Start debrox ear wax softeners for 5d and wax should drain out in shower. No more qtips. Try melatonin for sleep but stay away from doxylamine until your ear spasm/tingling resolves. If it continues, come get your labs done (maybe call first so I can add on a few more tests) and make an early dental appt.   Temporomandibular Problems  Temporomandibular joint (TMJ) dysfunction means there are problems with the joint between your jaw and your skull. This is a joint lined by cartilage like other joints in your body but also has a small disc in the joint which keeps the bones from rubbing on each other. These joints are like other joints and can get inflamed (sore) from arthritis and other problems. When this joint gets sore, it can cause headaches and pain in the jaw and the face. CAUSES  Usually the arthritic types of problems are caused by soreness in the joint. Soreness in the joint can also be caused by overuse. This may come from grinding your teeth. It may also come from mis-alignment in the joint. DIAGNOSIS Diagnosis of this condition can often be made by history and exam. Sometimes your caregiver may need X-rays or an MRI scan to determine the exact cause. It may be necessary to see your dentist to determine if your teeth and jaws are lined up correctly. TREATMENT  Most of the time this problem is not serious; however, sometimes it can persist (become chronic). When this happens medications that will cut down on inflammation (soreness) help. Sometimes a shot of cortisone  into the joint will be helpful. If your teeth are not aligned it may help for your dentist to make a splint for your mouth that can help this problem. If no physical problems can be found, the problem may come from tension. If tension is found to be the cause, biofeedback or relaxation techniques may be helpful. HOME CARE INSTRUCTIONS   Later in the day, applications of ice packs may be helpful. Ice can be used in a plastic bag with a towel around it to prevent frostbite to skin. This may be used about every 2 hours for 20 to 30 minutes, as needed while awake, or as directed by your caregiver.  Only take over-the-counter or prescription medicines for pain, discomfort, or fever as directed by your caregiver.  If physical therapy was prescribed, follow your caregiver's directions.  Wear mouth appliances as directed if they were given. Document Released: 08/10/2001 Document Revised: 02/07/2012 Document Reviewed: 11/17/2008 [facility] Patient Information 2015 [place], [place]. This information is not intended to replace advice given to you by your health care provider. Make sure you discuss any questions you have with your health care provider. Serous Otitis Media Serous otitis media is fluid in the middle ear space. This space contains the bones for hearing and air. Air in the middle ear space helps to transmit sound.  The air gets there through the eustachian tube. This tube goes from the back of the nose (nasopharynx) to the middle  ear space. It keeps the pressure in the middle ear the same as the outside world. It also helps to drain fluid from the middle ear space. CAUSES  Serous otitis media occurs when the eustachian tube gets blocked. Blockage can come from:  Ear infections.  Colds and other upper respiratory infections.  Allergies.  Irritants such as cigarette smoke.  Sudden changes in air pressure (such as descending in an airplane).  Enlarged adenoids.  A mass in the  nasopharynx. During colds and upper respiratory infections, the middle ear space can become temporarily filled with fluid. This can happen after an ear infection also. Once the infection clears, the fluid will generally drain out of the ear through the eustachian tube. If it does not, then serous otitis media occurs. SIGNS AND SYMPTOMS   Hearing loss.  A feeling of fullness in the ear, without pain.  Young children may not show any symptoms but may show slight behavioral changes, such as agitation, ear pulling, or crying. DIAGNOSIS  Serous otitis media is diagnosed by an ear exam. Tests may be done to check on the movement of the eardrum. Hearing exams may also be done. TREATMENT  The fluid most often goes away without treatment. If allergy is the cause, allergy treatment may be helpful. Fluid that persists for several months may require minor surgery. A small tube is placed in the eardrum to:  Drain the fluid.  Restore the air in the middle ear space. In certain situations, antibiotic medicines are used to avoid surgery. Surgery may be done to remove enlarged adenoids (if this is the cause). HOME CARE INSTRUCTIONS   Keep children away from tobacco smoke.  Keep all follow-up visits as directed by your health care provider. SEEK MEDICAL CARE IF:   Your hearing is not better in 3 months.  Your hearing is worse.  You have ear pain.  You have drainage from the ear.  You have dizziness.  You have serous otitis media only in one ear or have any bleeding from your nose (epistaxis).  You notice a lump on your neck. MAKE SURE YOU:  Understand these instructions.   Will watch your condition.   Will get help right away if you are not doing well or get worse.  Document Released: 02/05/2004 Document Revised: 04/01/2014 Document Reviewed: 06/12/2013 [facility] Patient Information 2015 [place], [place]. This information is not intended to replace advice given to you by your health care  provider. Make sure you discuss any questions you have with your health care provider.

## 2015-08-14 ENCOUNTER — Encounter: Payer: Self-pay | Admitting: Family Medicine

## 2015-08-14 ENCOUNTER — Ambulatory Visit (INDEPENDENT_AMBULATORY_CARE_PROVIDER_SITE_OTHER): Payer: BC Managed Care – PPO | Admitting: Psychology

## 2015-08-14 DIAGNOSIS — F902 Attention-deficit hyperactivity disorder, combined type: Secondary | ICD-10-CM

## 2015-08-14 DIAGNOSIS — F330 Major depressive disorder, recurrent, mild: Secondary | ICD-10-CM

## 2015-08-14 NOTE — Progress Notes (Signed)
   THERAPIST PROGRESS NOTE  Session Time: 3.20pm-3.52pm  Participation Level: Active  Behavioral Response: Well GroomedAlertaffect WNL  Type of Therapy: Individual Therapy  Treatment Goals addressed: Diagnosis: MDD and goal 1.  Interventions: Strength-based and Supportive  Summary: Wendy Levy is a 19 y.o. female who presents with affect wNL.  Pt reported that she has been doing well w/ school and remains on top of her school work.  Pt reported that her dad hasn't attempted to contact any further.  Pt reported that her mom had surgery and is recovering well from and that they have good friend support.  Pt reported that her sleep has been on and off. Pt was taking an OTC sleep aid by PCP recommended to stop.  Pt reported that she went recently to PCP for ear buzzing and found fluid in ear.  Pt reported has become worse in past day to a constant pain so is following up today about.  Pt reported that she looks forward to Thanksgiving break as going w/ mom to mountains to explore. Pt reported that she was talking w/ acquaintance recently who was struggling w/ depression and she found herself sharing about her past trauma and to not blame self.  Pt reported she hasn't been triggered by this experience and denies any intrusive thoughts of past.     Suicidal/Homicidal: Nowithout intent/plan  Therapist Response: Assessed pt current functioning per pt report.  Explored w/ pt if meeting academic goals for self and reflected pt positives to approach this semester.  Explored w/pt her interactions w/ family and discussed developmental change in relationship w/ mom.  Explored w/pt disclosure of past trauma in recent interaction and whether impacting.  Discussed safety in relationships and keeping healthy boundaries.  Plan: Return again in 2-3 weeks.  Diagnosis: MDD    Jan Fireman, [facility] 08/14/2015

## 2015-08-15 NOTE — Progress Notes (Signed)
Subjective:  This chart was scribed for Delman Cheadle, MD by Thea Alken, ED Scribe. This patient was seen in room 3 and the patient's care was started at 5:14 PM.   Patient ID: Wendy Levy, female    DOB: November 01, 1996, 19 y.o.   MRN: [id number]  HPI   Chief Complaint  Patient presents with  . "Feels like something is crwling in left ear"    on and off x 2 weeks    HPI Comments: Wendy Levy is a 19 y.o. female who presents to the [facility] complaining of intermittent left ear tingling. For the past 2 weeks, pt has had tingling sensation and spasm to the entrance of her left ear. Sensation is not triggered with certain positions, eating or time a day. Pt cleans her ears with q-tips. She takes zyrtec in the morning and sleep aid but denies any other or new medication.  Pt denies swelling, discharge or pain in left ear as well as hearing loss. Pt admits to grinding her teeth but does not wear a night guard. She is seen by dentist every 6 months. No cold symptoms but has had nosebleeds.   Patient Active Problem List   Diagnosis Date Noted  . BMI 40.0-44.9, adult 02/05/2015  . Migraine, unspecified, without mention of intractable migraine without mention of status migrainosus 03/15/2013  . Major depressive disorder, recurrent episode 01/31/2012  . ADHD (attention deficit hyperactivity disorder), combined type 11/02/2011  . ODD (oppositional defiant disorder) 11/02/2011  . Dysthymic disorder 11/02/2011   Past Medical History  Diagnosis Date  . ADHD (attention deficit hyperactivity disorder)   . Depression   . Oppositional defiant disorder   . Wrist fracture, bilateral   . Shoulder dislocation     bilateral  . Allergy   . Anxiety    Prior to Admission medications   Medication Sig Start Date End Date Taking? Authorizing Provider  cetirizine (ZYRTEC) 10 MG tablet Take 10 mg by mouth daily as needed for allergies.   Yes Historical Provider, MD  FLUoxetine (PROZAC) 20  MG capsule Take 1 capsule (20 mg total) by mouth daily. 03/18/15  Yes [name], MD  levonorgestrel-ethinyl estradiol (AVIANE,ALESSE,LESSINA) 0.1-20 MG-MCG tablet Take 1 tablet by mouth daily. 02/05/15  Yes Chelle Jeffery, PA-C  lisdexamfetamine (VYVANSE) 50 MG capsule Take 1 capsule (50 mg total) by mouth every morning. 07/07/15 10/14/15 Yes Hampton Abbot, MD   Depression screen [facility] 2/9 08/11/2015 06/25/2015 10/16/2014  Decreased Interest 0 0 1  Down, Depressed, Hopeless 0 0 1  PHQ - 2 Score 0 0 2  Altered sleeping - - 0  Tired, decreased energy - - 3  Change in appetite - - 1  Feeling bad or failure about yourself  - - 0  Trouble concentrating - - 0  Moving slowly or fidgety/restless - - 0  Suicidal thoughts - - 0  PHQ-9 Score - - 6     Review of Systems  Constitutional: Negative for fever, chills, activity change and unexpected weight change.  HENT: Positive for dental problem. Negative for congestion, ear discharge, ear pain, facial swelling, hearing loss, mouth sores, rhinorrhea, sinus pressure, sneezing, sore throat, tinnitus, trouble swallowing and voice change.   Eyes: Negative for photophobia, pain, discharge, redness, itching and visual disturbance.  Musculoskeletal: Negative for neck pain and neck stiffness.  Skin: Negative for color change and rash.  Neurological: Positive for facial asymmetry.  Hematological: Negative for adenopathy.  Psychiatric/Behavioral: Negative for sleep disturbance and  dysphoric mood. The patient is not nervous/anxious.    Objective:   Physical Exam  Constitutional: She is oriented to person, place, and time. She appears well-developed and well-nourished. No distress.  HENT:  Head: Normocephalic and atraumatic.  Mouth/Throat: Oropharynx is clear and moist. No oropharyngeal exudate, posterior oropharyngeal edema or posterior oropharyngeal erythema.  Small amount of cerumen in left ear. TM retracted with chronic mid ear effusion. Does no appear to  be injected or acutely infected.   Eyes: Conjunctivae and EOM are normal.  Neck: Neck supple.  Cardiovascular: Regular rhythm and normal heart sounds.  Tachycardia present.   Pulmonary/Chest: Effort normal and breath sounds normal. No respiratory distress. She has no wheezes. She has no rales. She exhibits no tenderness.  Musculoskeletal: Normal range of motion.  Neurological: She is alert and oriented to person, place, and time.  Skin: Skin is warm and dry.  Psychiatric: She has a normal mood and affect. Her behavior is normal.  Nursing note and vitals reviewed.  Filed Vitals:   08/11/15 1702  BP: 106/70  Pulse: 115  Temp: 98.1 F (36.7 C)  TempSrc: Oral  Resp: 16  Height: 5' 4.5" (1.638 m)  Weight: 254 lb (115.214 kg)  SpO2: 98%   Assessment & Plan:  . She has been advised OTC debrox to help with cerumen build up and lavage of ear for mid ear effusion.     1. Abnormal ear sensation, left   2. Left chronic serous otitis media   3. Allergic rhinitis due to pollen   Unknown etiology of new left external ear tingling - pt compares it to  blepharospasm but cannot ever see any movement - just feels like tragus area.  I think most likely this is from her known teeth grinding causing TMJ type sxs so restart otc mouthguard. We can consider muscle relaxer for TMJ, over night if this becomes more prominent.  Try to minimize/eliminate ear bud/head phone/phone use on the left side.  Can try top peroxide.  Also try nasal steroid to improve allegic rhinitis/ETD   Meds ordered this encounter  Medications  . fluticasone (FLONASE) 50 MCG/ACT nasal spray    Sig: Place 2 sprays into both nostrils at bedtime.    Dispense:  16 g    Refill:  1    I personally performed the services described in this documentation, which was scribed in my presence. The recorded information has been reviewed and considered, and addended by me as needed.  Delman Cheadle, MD MPH

## 2015-09-01 ENCOUNTER — Other Ambulatory Visit (HOSPITAL_COMMUNITY): Payer: Self-pay | Admitting: Psychiatry

## 2015-09-04 ENCOUNTER — Ambulatory Visit (INDEPENDENT_AMBULATORY_CARE_PROVIDER_SITE_OTHER): Payer: BC Managed Care – PPO | Admitting: Psychology

## 2015-09-04 DIAGNOSIS — F902 Attention-deficit hyperactivity disorder, combined type: Secondary | ICD-10-CM

## 2015-09-04 DIAGNOSIS — F330 Major depressive disorder, recurrent, mild: Secondary | ICD-10-CM

## 2015-09-04 NOTE — Progress Notes (Signed)
   THERAPIST PROGRESS NOTE  Session Time: 3:30pm-4.15pm  Participation Level: Active  Behavioral Response: Well GroomedAlert, AFFECT WNL  Type of Therapy: Individual Therapy  Treatment Goals addressed: Diagnosis: MDD and goal 1.  Interventions: CBT and Supportive  Summary: Wendy Levy is a 19 y.o. female who presents with report of doing ok today.  Pt reported that her mood has been so-so.  Pt reported that she finds that she gets easily irritable.  Pt denies depressed mood,  Pt reports that she is sleeping better.  Pt reported that she is keeping up w/her school work.  Pt reported she is failing Spanish as couldn't complete a test w/ computer problems and professors wouldn't make exception to redo.  Pt reports that she is going to work hard to do her best on other assignments in hopes of passing class.  Pt reported that she enjoyed weekend w/ her boyfriend last week and that she plans to spend night w/ her sister this weekend.  Pt reports she plans to take test for drivers license next week.   Suicidal/Homicidal: Nowithout intent/plan  Therapist Response: Assessed pt current functioning per pt report.  Processed w/pt her mood and potential contributing factors of feeling easily irritable and explored other potential mood symptoms.  Explored w/pt positive interactions she has had w/ others and upcoming plans.  Discussed w/pt her strengths and plan to approach to meet goals for semester and drivers license.   Plan: Return again in 3 weeks.  Diagnosis: MDD    Jan Fireman, [facility] 09/04/2015

## 2015-09-18 ENCOUNTER — Encounter (HOSPITAL_COMMUNITY): Payer: Self-pay | Admitting: Psychiatry

## 2015-09-18 ENCOUNTER — Ambulatory Visit (INDEPENDENT_AMBULATORY_CARE_PROVIDER_SITE_OTHER): Payer: BC Managed Care – PPO | Admitting: Psychiatry

## 2015-09-18 VITALS — BP 132/78 | HR 98 | Ht 64.0 in | Wt 257.2 lb

## 2015-09-18 DIAGNOSIS — F900 Attention-deficit hyperactivity disorder, predominantly inattentive type: Secondary | ICD-10-CM

## 2015-09-18 DIAGNOSIS — F913 Oppositional defiant disorder: Secondary | ICD-10-CM

## 2015-09-18 DIAGNOSIS — F341 Dysthymic disorder: Secondary | ICD-10-CM

## 2015-09-18 MED ORDER — LISDEXAMFETAMINE DIMESYLATE 50 MG PO CAPS: 50.0000 mg | ORAL_CAPSULE | ORAL | Status: DC

## 2015-09-18 MED ORDER — FLUOXETINE HCL 20 MG PO CAPS: 20.0000 mg | ORAL_CAPSULE | Freq: Every day | ORAL | Status: DC

## 2015-09-18 NOTE — Progress Notes (Signed)
Wendy Levy [phone] Progress Note  Wendy Levy [id number] 19 y.o.   Chief Complaint: "good"  History of Present Illness: Patient is a 19 year old diagnosed with ADHD combined type, Dysthmia and ODD who presents today for a followup visit with her mother.  Pt finished up her spring semester at [employer]. Pt is taking another communications class, computer, english and spanish class online. States she is handling it but it is a lot of work.   Pt states Vyvanse is ok. She is goal oriented. Pt is able to meet deadlines. Pt is doing well in school. Mom states pt is doing well with Vyvanse. Denies GI upset and mood changes. Reports mild appetite suppression and she is no longer snacking or eating huge portions.   Pt reports she has constant headaches and pain doesn't resolve with meds or sleep. Pt went to the eye doctor and pt has been diagnosed with asthigmatism.   Irritability remains but pt controls her response. Mom states pt is controlling her reaction pretty well.   Pt denies depression and sad mood. Pt has sad mood when she fights with her boyfriend.  Denies crying spells, worthlessness, hopelessness, isolation and anhedonia. Pt is no longer playing a lot of video games to escape.   Pt is having broken sleep due to back pain. Thinks it is related to weight.  Energy is low and she is napping during the day.     Anxiety is fine. Anxiety is more situational.   Denies AVH. Denies irritability, grandious thoughts, compulsive or new/change in substance use.   Pt taking meds as prescribed. Denies SE from meds.    Mom has no concerns and states pt has been doing really well. Denies any behavorial issues.   Suicidal Ideation: No Plan Formed: No Patient has means to carry out plan: No  Homicidal Ideation: No Plan Formed: No Patient has means to carry out plan: No  Review of Systems:  Review of Systems  Constitution: Negative for chills, decreased appetite, fever, weakness, night  sweats and weight gain.  HENT: Positive for headaches. Negative for congestion, ear pain, hearing loss, nosebleeds and sore throat.   Eyes: Positive for blurred vision. Negative for pain and photophobia.  Cardiovascular: Negative for chest pain, leg swelling and palpitations.  Respiratory: Negative for cough, shortness of breath, snoring and wheezing.   Endocrine: Negative.   Musculoskeletal: Positive for back pain and joint pain. Negative for falls, muscle weakness, myalgias and neck pain.  Gastrointestinal: Negative for bloating, abdominal pain, constipation, diarrhea, nausea and vomiting.  Neurological: Positive for difficulty with concentration and excessive daytime sleepiness. Negative for dizziness, numbness and seizures.  Psychiatric/Behavioral: Negative for altered mental status, depression, hallucinations, hypervigilance, memory loss, substance abuse, suicidal ideas and thoughts of violence. The patient has insomnia. The patient is not nervous/anxious.       Past Medical Family, Social History: Patient lives with her adoptive mom. Patient goes to [employer]. Pt got her drivers license this week.   reports that she has never smoked. She has never used smokeless tobacco. She reports that she does not drink alcohol or use illicit drugs.  Family History  Problem Relation Age of Onset  . Adopted: Yes  . Depression Sister     reportedly in and out of treatment facilities, also dx w/ RAD  . Suicidality Sister     Past Medical History  Diagnosis Date  . ADHD (attention deficit hyperactivity disorder)   . Depression   . Oppositional defiant disorder   .  Wrist fracture, bilateral   . Shoulder dislocation     bilateral  . Allergy   . Anxiety     Outpatient Encounter Prescriptions as of 09/18/2015  Medication Sig  . cetirizine (ZYRTEC) 10 MG tablet Take 10 mg by mouth daily as needed for allergies.  Marland Kitchen FLUoxetine (PROZAC) 20 MG capsule TAKE 1 CAPSULE(20 MG) BY MOUTH DAILY  . fluticasone  (FLONASE) 50 MCG/ACT nasal spray Place 2 sprays into both nostrils at bedtime.  Marland Kitchen levonorgestrel-ethinyl estradiol (AVIANE,ALESSE,LESSINA) 0.1-20 MG-MCG tablet Take 1 tablet by mouth daily.  Marland Kitchen lisdexamfetamine (VYVANSE) 50 MG capsule Take 1 capsule (50 mg total) by mouth every morning.   No facility-administered encounter medications on file as of 09/18/2015.    Past Psychiatric History/Hospitalization(s): Anxiety: No Bipolar Disorder: No Depression: Yes Mania: No Psychosis: No Schizophrenia: No Personality Disorder: No Hospitalization for psychiatric illness: No History of Electroconvulsive Shock Therapy: No Prior Suicide Attempts: No  Physical Exam: Constitutional:  BP 132/78 mmHg  Pulse 98  Ht 5\' 4"  (1.626 m)  Wt 257 lb 3.2 oz (116.665 kg)  BMI 44.13 kg/m2  General Appearance: alert, oriented, no acute distress and obese  Musculoskeletal: Strength & Muscle Tone: within normal limits Gait & Station: normal Patient leans: N/A   Mental Status Examination/Evaluation: Objective: Attitude: Calm and cooperative  Appearance: Casual and orange hair, appears to be stated age  Eye Contact::  Fair  Speech:  Clear and Coherent, Normal Rate and spontaneous  Volume:  Normal  Mood:  euthymic  Affect:  irritable  Thought Process:  Intact, Linear and Logical  Orientation:  Full (Time, Place, and Person)  Thought Content:  Negative  Suicidal Thoughts:  No  Homicidal Thoughts:  No  Judgement:  Fair  Insight:  Fair  Concentration: good  Memory: Immediate-intact Recent-intact Remote-intact  Recall: fair  Language: fair  Gait and Station: normal  [employer] of Knowledge: average  Psychomotor Activity:  Normal  Akathisia:  No  Handed:  Right  AIMS (if indicated): n/a  Assets:  Armed forces logistics/support/administrative officer Desire for Improvement Stage manager (Choose Three): Established Problem, Stable/Improving (1), Review of  Psycho-Social Stressors (1) and Review of Medication Regimen & Side Effects (2)  Assessment: Axis I: ADHD combined type, moderate severity; oppositional defiant disorder; dysthymic disorder  Axis II: Deferred  Axis III: Obese Past Medical History  Diagnosis Date  . ADHD (attention deficit hyperactivity disorder)   . Depression   . Oppositional defiant disorder   . Wrist fracture, bilateral   . Shoulder dislocation     bilateral  . Allergy   . Anxiety      Axis UK:GURKYHC coping skills  Axis V: 65 to 70   Plan: ADHD combined type:  Vyvanse 50 mg one in the morning for ADHD inattentive type.Marland Kitchen Dysthymic disorder: Continue Prozac 20 MG one in the morning for dysthymia Oppositional defiant disorder: Mom reports that the patient is doing well with her behavior  Medication management with supportive therapy. Risks/benefits and SE of the medication discussed. Pt verbalized understanding and verbal consent obtained for treatment.  Affirm with the patient that the medications are taken as ordered. Patient expressed understanding of how their medications were to be used.  -improvement of symptoms  Labs: 02/05/2015 CBC and CMP WNL, TSH WNL  Therapy: brief supportive therapy provided. Discussed psychosocial stressors in detail.   encouraged to continue individual counseling with LeAnn  Pt denies SI and is at an acute  low risk for suicide.Patient told to call clinic if any problems occur. Patient advised to go to ER if they should develop SI/HI, side effects, or if symptoms worsen. Has crisis numbers to call if needed. Pt verbalized understanding.  F/up in 3 months or sooner if needed  [name], MD

## 2015-09-25 ENCOUNTER — Ambulatory Visit (HOSPITAL_COMMUNITY): Payer: Self-pay | Admitting: Psychology

## 2015-09-30 ENCOUNTER — Ambulatory Visit (INDEPENDENT_AMBULATORY_CARE_PROVIDER_SITE_OTHER): Payer: BC Managed Care – PPO | Admitting: Psychology

## 2015-09-30 DIAGNOSIS — F330 Major depressive disorder, recurrent, mild: Secondary | ICD-10-CM

## 2015-09-30 NOTE — Progress Notes (Signed)
   THERAPIST PROGRESS NOTE  Session Time: 3.25pm-3.57pm  Participation Level: Active  Behavioral Response: Well GroomedAlertguilt  Type of Therapy: Individual Therapy  Treatment Goals addressed: Diagnosis: MDD, aDHd and goal 1.  Interventions: CBT and Psychosocial Skills: resolution , setting boundaries  Summary: Wendy Levy is a 19 y.o. female who presents with full and bright affect.  Pt was excited to share that she received her drivers license on 93/71/69, drove herself to appointment.  Pt reported that she drives herself to school at times, drives self for social activities etc.  Pt discussed that to have the car full time she needs to have a job- pt planning for this wasn't well thought out.  I will look for work when employers are hiring for holiday season.  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  Pt did report that she reached out to her professor about bringing up her grade and that this went well and completing assignments this week. Pt express feeling guilt for not being honest w/ mom.  Pt and friend had another falling out- mom expressed didn't want her to continue friendship- after couple weeks she forgave friend and has been lying to mom about going out with her. Pt acknowledges need to resolve but is worried that will ruin trust.  Pt considered options and potential responses.   Suicidal/Homicidal: Nowithout intent/plan  Therapist Response: Assessed pt current functioning per pt report. 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  Encouraged pt towards next steps of exploring this.  Processed w/pt her guilt.  Discussed how to resolve with mom and had pt identify her options and potential outcomes of each for decision making.    Plan: Return again in 3-4 weeks.  Diagnosis: MDD, ADHD    YATES,LEANNE, LPC 09/30/2015

## 2015-10-21 ENCOUNTER — Ambulatory Visit (INDEPENDENT_AMBULATORY_CARE_PROVIDER_SITE_OTHER): Payer: BC Managed Care – PPO | Admitting: Psychology

## 2015-10-21 DIAGNOSIS — F902 Attention-deficit hyperactivity disorder, combined type: Secondary | ICD-10-CM

## 2015-10-21 DIAGNOSIS — F330 Major depressive disorder, recurrent, mild: Secondary | ICD-10-CM

## 2015-10-21 NOTE — Progress Notes (Signed)
   THERAPIST PROGRESS NOTE  Session Time: 2.28pm-3.08pm  Participation Level: Active  Behavioral Response: Well GroomedAlertaffect wNL  Type of Therapy: Individual Therapy  Treatment Goals addressed: Diagnosis: MDD, ADHd and goal 1.  Interventions: Psychosocial Skills: setting boundaries and Supportive  Summary: Wendy Levy is a 19 y.o. female who presents with generally full and bright affect.  Pt is excited to introduce to her boyfriend who is spending the day with her. Pt reported that she is enjoying driving and the freedom she feels with this- feels more independent.  Pt reported that she was able to talk with her mom about interacting w/ friend again that mom didn't approve of and that this went well.  Pt reported that she did have a dream about her friend- being there for her when life was threatened- pt discussed how wasn't a nightmare but felt more like a hero. Pt reported that next day friend did contact her and wanted her to drop everything to be there for her. Pt reported that she wasn't going to to this as was in class and that they haven't talked since. Pt acknowledges that the relationship isn't the healthiest and that both are using each other.  Pt discussed setting boundaries to not get engaged in negative stressors with her or take on stressor of friend's.   Pt wanted boyfriend present w/ her for last 5 minutes to meet counselor- pt and boyfriend discussed enjoying time together.    Suicidal/Homicidal: Nowithout intent/plan  Therapist Response: Assessed pt current functioning per pt report.  Explored w/pt her communication w/ mom and how mom supportive of her even when disagrees.  Processed w/pt interactions w/ friend and risk of interactions together.  Discussed setting boundaries to not have negative impact on her mood.  Met pt boyfriend.   Plan: Return again in 3 weeks.  Diagnosis: ADHD, MDD    ,, LPC 10/21/2015  

## 2015-11-11 ENCOUNTER — Ambulatory Visit (HOSPITAL_COMMUNITY): Payer: Self-pay | Admitting: Psychology

## 2015-11-11 ENCOUNTER — Telehealth (HOSPITAL_COMMUNITY): Payer: Self-pay | Admitting: Psychology

## 2015-11-11 NOTE — Telephone Encounter (Signed)
Left message for pt at both numbers informing need to cancel today's appointment to care for sick child.  Counselor offered for pt to come in early today at 56 or 12:30pm.  Asked pt to call back.

## 2015-12-16 ENCOUNTER — Ambulatory Visit (INDEPENDENT_AMBULATORY_CARE_PROVIDER_SITE_OTHER): Payer: BC Managed Care – PPO | Admitting: Psychology

## 2015-12-16 DIAGNOSIS — F330 Major depressive disorder, recurrent, mild: Secondary | ICD-10-CM

## 2015-12-16 DIAGNOSIS — F902 Attention-deficit hyperactivity disorder, combined type: Secondary | ICD-10-CM

## 2015-12-16 NOTE — Progress Notes (Signed)
   THERAPIST PROGRESS NOTE  Session Time: 12.30pm-1pm  Participation Level: Active  Behavioral Response: Well GroomedAlertaffect wNL  Type of Therapy: Individual Therapy  Treatment Goals addressed: Diagnosis: ADHD, MDD and goal 1.  Interventions: Motivational Interviewing and Supportive  Summary: Wendy Levy is a 20 y.o. female who presents with generally full and bright affect.  Pt reported that her mood has been good.  Pt reported that she enjoyed her holidays.  Pt reports that started back to school last week- taking MWF Pscy and Public Speaking then [employer] Math and Art.  Pt school days start at 9am and end at 3:15.  Pt reported that she is looking for a job as well and has been putting in applications again.  Pt reported that her sister moved back to [facility] [address] and had been calling on her almost daily to take her places- recently had to tell her couldn't.  Pt also reported that she has stopped friendship w/ friend again as recently negative interaction that her acting strangely and made her feel very uncomfortable.  Pt felt that she had other social outlets to keep from reengaging with this friend again.  Pt also reported that she ended relationship w/ boyfriend as didn't feel that she was doing all the work.  Pt identified that her focus is on school and gaining employment.   Suicidal/Homicidal: Nowithout intent/plan  Therapist Response: Assessed pt current functioning per pt report.  Processed w/pt her interacitons w/ family and friends.  Explored w/pt her start back w/ new semester.  Had pt identify what are healthy relationship and how to keep healthy boundaries she is wanting.    Plan: Return again in 3 weeks.  Diagnosis: ADHD, MDD      Cortlyn Cannell, [facility] 12/16/2015

## 2015-12-18 ENCOUNTER — Ambulatory Visit (HOSPITAL_COMMUNITY): Payer: Self-pay | Admitting: Psychiatry

## 2015-12-19 ENCOUNTER — Encounter: Payer: Self-pay | Admitting: Family Medicine

## 2015-12-24 ENCOUNTER — Encounter: Payer: Self-pay | Admitting: Family Medicine

## 2015-12-25 ENCOUNTER — Ambulatory Visit (INDEPENDENT_AMBULATORY_CARE_PROVIDER_SITE_OTHER): Payer: BC Managed Care – PPO | Admitting: Psychiatry

## 2015-12-25 ENCOUNTER — Encounter (HOSPITAL_COMMUNITY): Payer: Self-pay | Admitting: Psychiatry

## 2015-12-25 VITALS — BP 118/74 | HR 80 | Ht 64.0 in | Wt 260.6 lb

## 2015-12-25 DIAGNOSIS — F341 Dysthymic disorder: Secondary | ICD-10-CM | POA: Diagnosis not present

## 2015-12-25 DIAGNOSIS — F913 Oppositional defiant disorder: Secondary | ICD-10-CM

## 2015-12-25 DIAGNOSIS — F900 Attention-deficit hyperactivity disorder, predominantly inattentive type: Secondary | ICD-10-CM | POA: Diagnosis not present

## 2015-12-25 MED ORDER — LISDEXAMFETAMINE DIMESYLATE 50 MG PO CAPS: 50.0000 mg | ORAL_CAPSULE | ORAL | Status: DC

## 2015-12-25 MED ORDER — FLUOXETINE HCL 20 MG PO CAPS: 20.0000 mg | ORAL_CAPSULE | Freq: Every day | ORAL | Status: DC

## 2015-12-25 NOTE — Progress Notes (Signed)
Patient ID: Wendy Levy, female   DOB: [birth year], 20 y.o.   MRN: [id number] [place] [id number] Progress Note  Wendy Levy [id number] 20 y.o.   Chief Complaint: "good"  History of Present Illness: Patient is a 20 year old diagnosed with ADHD combined type, Dysthmia and ODD who presents today for a followup visit.  Pt is taking classes at [employer].Pt is no longer doing online courses as she realized it is better for her to be in classes with others. Pt has not made any friends yet.   Pt states Vyvanse is ok. She is goal oriented. Pt is able to meet deadlines. Pt is doing well in school.  Denies GI upset and mood changes. Reports mild appetite suppression and she is no longer snacking or eating huge portions.   Pt reports she has constant headaches and pain doesn't resolve with meds or sleep. Pt went to the eye doctor and pt has been diagnosed with asthigmatism.   Pt denies depression and sad mood and irritability. Denies crying spells, worthlessness, hopelessness, isolation and anhedonia. Pt is no longer playing a lot of video games to escape. Pt is going out more.   Pt is having broken sleep and states it is a chronic problem. Thinks it is related to weight. Pt states recently she has been getting about 8 hrs/night.  Energy is low and she is napping during the day.     Anxiety is fine. Anxiety is more situational and rarely happens.   Denies AVH. Denies irritability, grandious thoughts, compulsive or new/change in substance use.   Pt taking meds as prescribed. Denies SE from meds.    Suicidal Ideation: No Plan Formed: No Patient has means to carry out plan: No  Homicidal Ideation: No Plan Formed: No Patient has means to carry out plan: No  Review of Systems:  Review of Systems  Constitution: Negative for chills, decreased appetite, fever, weakness, night sweats and weight gain.  HENT: Positive for headaches. Negative for congestion, ear pain, hearing loss, nosebleeds and  sore throat.   Eyes: Negative for blurred vision, pain and photophobia.  Cardiovascular: Negative for chest pain, leg swelling and palpitations.  Respiratory: Negative for cough, shortness of breath, snoring and wheezing.   Endocrine: Negative.   Skin: Negative for color change, dry skin, itching and rash.  Musculoskeletal: Positive for back pain and joint pain. Negative for falls, muscle weakness, myalgias and neck pain.  Gastrointestinal: Negative for bloating, abdominal pain, constipation, diarrhea, nausea and vomiting.  Neurological: Positive for difficulty with concentration. Negative for excessive daytime sleepiness, dizziness, numbness and seizures.  Psychiatric/Behavioral: Negative for altered mental status, depression, hallucinations, hypervigilance, memory loss, substance abuse, suicidal ideas and thoughts of violence. The patient has insomnia. The patient is not nervous/anxious.       Past Medical Family, Social History: Patient lives with her adoptive mom. Patient goes to [facility]. Pt got her drivers license this week.   reports that she has never smoked. She has never used smokeless tobacco. She reports that she does not drink alcohol or use illicit drugs.  Family History  Problem Relation Age of Onset  . Adopted: Yes  . Depression Sister     reportedly in and out of treatment facilities, also dx w/ RAD  . Suicidality Sister     Past Medical History  Diagnosis Date  . ADHD (attention deficit hyperactivity disorder)   . Depression   . Oppositional defiant disorder   . Wrist fracture, bilateral   . Shoulder dislocation  bilateral  . Allergy   . Anxiety     Outpatient Encounter Prescriptions as of 12/25/2015  Medication Sig  . cetirizine (ZYRTEC) 10 MG tablet Take 10 mg by mouth daily as needed for allergies.  Marland Kitchen FLUoxetine (PROZAC) 20 MG capsule Take 1 capsule (20 mg total) by mouth daily.  . fluticasone (FLONASE) 50 MCG/ACT nasal spray Place 2 sprays into both nostrils  at bedtime.  Marland Kitchen levonorgestrel-ethinyl estradiol (AVIANE,ALESSE,LESSINA) 0.1-20 MG-MCG tablet Take 1 tablet by mouth daily.  Marland Kitchen lisdexamfetamine (VYVANSE) 50 MG capsule Take 1 capsule (50 mg total) by mouth every morning.  . Multiple Vitamin (MULTIVITAMIN) capsule Take 1 capsule by mouth daily.   No facility-administered encounter medications on file as of 12/25/2015.    Past Psychiatric History/Hospitalization(s): Anxiety: No Bipolar Disorder: No Depression: Yes Mania: No Psychosis: No Schizophrenia: No Personality Disorder: No Hospitalization for psychiatric illness: No History of Electroconvulsive Shock Therapy: No Prior Suicide Attempts: No  Physical Exam: Constitutional:  BP 118/74 mmHg  Pulse 80  Ht 5\' 4"  (1.626 m)  Wt 260 lb 9.6 oz (118.207 kg)  BMI 44.71 kg/m2  General Appearance: alert, oriented, no acute distress and obese  Musculoskeletal: Strength & Muscle Tone: within normal limits Gait & Station: normal Patient leans: N/A   Mental Status Examination/Evaluation: Objective: Attitude: Calm and cooperative  Appearance: Casual, appears to be stated age  Eye Contact::  Fair  Speech:  Clear and Coherent, Normal Rate and spontaneous  Volume:  Normal  Mood:  euthymic  Affect:  Constricted  Thought Process:  Intact, Linear and Logical  Orientation:  Full (Time, Place, and Person)  Thought Content:  Negative  Suicidal Thoughts:  No  Homicidal Thoughts:  No  Judgement:  Fair  Insight:  Fair  Concentration: good  Memory: Immediate-intact Recent-intact Remote-intact  Recall: fair  Language: fair  Gait and Station: normal  [employer] of Knowledge: average  Psychomotor Activity:  Normal  Akathisia:  No  Handed:  Right  AIMS (if indicated): n/a  Assets:  Armed forces logistics/support/administrative officer Desire for Improvement Stage manager (Choose Three): Established Problem, Stable/Improving (1), Review of Psycho-Social  Stressors (1) and Review of Medication Regimen & Side Effects (2)  Assessment: Axis I: ADHD combined type, moderate severity; oppositional defiant disorder; dysthymic disorder  Axis II: Deferred    Plan: ADHD combined type: Vyvanse 50 mg one in the morning for ADHD inattentive type.Marland Kitchen Dysthymic disorder: Continue Prozac 20 MG one in the morning for dysthymia Oppositional defiant disorder: well controlled   Medication management with supportive therapy. Risks/benefits and SE of the medication discussed. Pt verbalized understanding and verbal consent obtained for treatment.  Affirm with the patient that the medications are taken as ordered. Patient expressed understanding of how their medications were to be used.  -improvement of symptoms  Labs: 02/05/2015 CBC and CMP WNL, TSH WNL  Therapy: brief supportive therapy provided. Discussed psychosocial stressors in detail.   encouraged to continue individual counseling with LeAnn  Pt denies SI and is at an acute low risk for suicide.Patient told to call clinic if any problems occur. Patient advised to go to ER if they should develop SI/HI, side effects, or if symptoms worsen. Has crisis numbers to call if needed. Pt verbalized understanding.  F/up in 3 months or sooner if needed  [name], MD

## 2015-12-30 ENCOUNTER — Ambulatory Visit (INDEPENDENT_AMBULATORY_CARE_PROVIDER_SITE_OTHER): Payer: BC Managed Care – PPO | Admitting: Psychology

## 2015-12-30 DIAGNOSIS — F330 Major depressive disorder, recurrent, mild: Secondary | ICD-10-CM

## 2015-12-30 DIAGNOSIS — F902 Attention-deficit hyperactivity disorder, combined type: Secondary | ICD-10-CM

## 2015-12-30 DIAGNOSIS — F4310 Post-traumatic stress disorder, unspecified: Secondary | ICD-10-CM

## 2015-12-30 NOTE — Progress Notes (Signed)
   THERAPIST PROGRESS NOTE  Session Time: 2.30pm-3.14pm  Participation Level: Active  Behavioral Response: Fairly GroomedAlertTired  Type of Therapy: Individual Therapy  Treatment Goals addressed: Diagnosis: ADHD, PTSD, MDD and goal 1.  Interventions: CBT, Assertiveness Training and Other: safety planning, identifying triggers and self care  Summary: Bellamia Tarman is a 20 y.o. female who presents with affect congruent w/ being tired.  Pt introduced to new boyfriend in the lobby- dating for 2 weeks.  Pt reported that she hasn't been sleeping well- sleeps about 2 hours wakes, back to sleep for one hour then awake.  Pt reports that she isn't taking any new medications, not using any drugs or alcohol and reports- not ruminating on worries.  Pt re describes sleep disturbance as feeling out of her body watching herself sleep.  Pt reported this has been occuring for about the past month.  Pt increased awareness may be related to past trauma experienced.  Pt reported that she has been spending more time w/ sister in the past month- having intrusive thoughts of "things sister tried on her" and that 2 incidents of being triggered to flashback- feeling as is the little girl that experienced trauma.  Pt reported one was heater like in her childhood and the other a very disorganized living space like she experienced.  Pt was able to remove herself from these situations.  Pt discussed journaling about and identified supports and places that feels secure.  Pt reports she is volunteering w/ [employer] and walking more.  Pt reports these feel good to do.  Pt identifies that she needs to be able to say no to sister or other when in her best interest and needs to have as goal- pt reports that she has been able to do this more- recently.     Suicidal/Homicidal: Nowithout intent/plan  Therapist Response: Assessed pt current functioning per pt report.  Explored w/pt sleep disturbance- reflected to pt what  she is describing sounds like detachment dissociative and may be related to history of trauma.  Assisted pt in identifying contributing factors and identifying triggers that experiencing.  Focused pt on safety planning for when triggered, avoiding triggers when can, identify supports, safe places and how to use movement to connect w/ her own body in positive way.  Supportive of journaling.   Plan: Return again in 2 weeks.  Diagnosis: ADHD, PTSD    Ashwini Jago, LPC 12/30/2015

## 2016-01-15 ENCOUNTER — Ambulatory Visit (INDEPENDENT_AMBULATORY_CARE_PROVIDER_SITE_OTHER): Payer: BC Managed Care – PPO | Admitting: Psychology

## 2016-01-15 DIAGNOSIS — F330 Major depressive disorder, recurrent, mild: Secondary | ICD-10-CM

## 2016-01-15 NOTE — Progress Notes (Signed)
   THERAPIST PROGRESS NOTE  Session Time: 2.39pm-3.13pm  Participation Level: Active  Behavioral Response: Fairly GroomedAlert, blunted   Type of Therapy: Individual Therapy  Treatment Goals addressed: Diagnosis: MDD, PtSD and goal 1.  Interventions: CBT, Motivational Interviewing and Supportive  Summary: Wendy Levy is a 20 y.o. female who presents with affect blunted.  Pt reports she is tired- woke earlier than planned. Pt reported that she has been sleeping a little better and feels that if she is more active in her day w/doing things she is able to sleep better. Pt reported that she isn't reexperiencing in past trauma and no intrusive thoughts.  Pt reports that she and sister decided to take a break till summer w/ spending time together. Pt is no longer being called on to drive her around etc.  Pt reports she is keeping up w/ school work and completed her next math level. Pt reports she isn't applying to job- feels that focus w/ school is enough.  Pt is volunteering w/ [employer] at least monthly- to keep car privileges as agreed w/ mom. Pt reports she is spending days off w/her boyfriend.  Pt reports she just doesn't feel motivated in her day to day- school stresses.  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   Suicidal/Homicidal: Nowithout intent/plan  Therapist Response: Assessed pt current functioning per pt report.  Processed w/pt boundaries she is putting into place and discussed PtSD symptoms.  Explored w/pt what she dislikes in her day to day and discussed what she would be more interested in doing.  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   Plan: Return again in 2 weeks.  Diagnosis: MDD, PTSD    Lord Lancour, [place] 01/15/2016

## 2016-01-27 ENCOUNTER — Ambulatory Visit (INDEPENDENT_AMBULATORY_CARE_PROVIDER_SITE_OTHER): Payer: BC Managed Care – PPO | Admitting: Psychology

## 2016-01-27 DIAGNOSIS — F330 Major depressive disorder, recurrent, mild: Secondary | ICD-10-CM

## 2016-01-27 DIAGNOSIS — F902 Attention-deficit hyperactivity disorder, combined type: Secondary | ICD-10-CM

## 2016-01-27 NOTE — Progress Notes (Signed)
   THERAPIST PROGRESS NOTE  Session Time: 2.44pm-3.20pm  Participation Level: Active  Behavioral Response: Well GroomedAlert, AFFECT WNL  Type of Therapy: Individual Therapy  Treatment Goals addressed: Diagnosis: MDD, ADHD and goal 1.  Interventions: Strength-based and Supportive  Summary: Wendy Levy is a 20 y.o. female who presents with affect WNL. Pt reported that she woke at 6am to meet w/ her boyfriend for breakfast.  Pt reported she is getting back on track w/ school and has set up to do study/school work mornings she doesn't have class and than socialize in the afternoon.  Pt reported she is sleeping better- mood good- interactions w/ mom positive and no major stressors.  Pt invited boyfriend in to session for last 5 minutes to introduce.  Boyfriend spoke positively about benefit she has in his life as a calming presence.  They discussed plans for after session and whether to connect w/ his brother who had some recent conflict w/- pt asserted her thoughts and feelings and encouraged resolution.     Suicidal/Homicidal: Nowithout intent/plan  Therapist Response: Assessed pt current functioning per pt and parent report.  Processed w/pt her how balancing school and social interactions.  Explored w/pt interactions w/ family and boyfriend and her self care.  Met pt boyfriend.    Plan: Return again in 2 weeks.  Diagnosis: MDD    Jan Fireman, [facility] 01/27/2016

## 2016-02-26 ENCOUNTER — Ambulatory Visit (INDEPENDENT_AMBULATORY_CARE_PROVIDER_SITE_OTHER): Payer: BC Managed Care – PPO | Admitting: Psychology

## 2016-02-26 DIAGNOSIS — F330 Major depressive disorder, recurrent, mild: Secondary | ICD-10-CM

## 2016-02-26 DIAGNOSIS — F902 Attention-deficit hyperactivity disorder, combined type: Secondary | ICD-10-CM | POA: Diagnosis not present

## 2016-02-26 NOTE — Progress Notes (Signed)
   THERAPIST PROGRESS NOTE  Session Time: 8.02am-8.55am  Participation Level: Active  Behavioral Response: Fairly GroomedAlertblunted affect  Type of Therapy: Family Therapy  Treatment Goals addressed: Diagnosis: mdd,adhd and goal1  Interventions: Motivational Interviewing and Supportive  Summary: Dajanee Russotto is a 20 y.o. female who presents with blunted affect.  Pt reports she it tired as it is early. Pt reports she has asked mom here today to discuss current situation.  Pt reported that she withdrew from classes this semester as was failiing. Pt admits that she stopped going to classes shortly after getting her car- pt reports she wasn't doing her work.  Pt reports she has been hanging out w/ her boyfriend although he was encouraging her to be at school.  Pt has lost the car privileges she had and pt reports that mom and her had a big argument last night.  Pt reported that she stated she was moving out on Saturday as wants to learn from own mistakes and become independent herself. Pt also acknowledged she doesn't want rules at home to live by.  Pt option is to move into a motel w/ boyfriend and 2 other individuals that are homeless.  Pt reports she also considers staying at home as aware support and not always responsible. Pt expressed hurt that her sister didn't agree w/ her and told her she was making poor choices and hanging out w/ wrong crowd. Pt discussed feeling part of community of homeless and transient friends. Mom expressed her want for pt to become independent as well but not in path she is considering.  Pt discussed wanting to get a job and mom agreed.  Mom discussed w/ pt limits and guidelines of contract for living at home, focusing on job seeking and earning car privileges.    Suicidal/Homicidal: Nowithout intent/plan  Therapist Response: ASsessed pt current functioning per pt and parent report.  Processed w/pt transitions she is in current.  Discussed pt decision to withdraw  and her actions that lead to that.  Discussed pt goals and wants- steps and barriers.  Explored w/pt steps she can take towards her independence w/ support and how to still be connected to community.  Facilitated communication between pt and mom.   Plan: Return again in 2 weeks.  Diagnosis: MDD, ADHD    Arryana Tolleson, [place] 02/26/2016

## 2016-03-09 ENCOUNTER — Ambulatory Visit (INDEPENDENT_AMBULATORY_CARE_PROVIDER_SITE_OTHER): Payer: BC Managed Care – PPO | Admitting: Psychology

## 2016-03-09 DIAGNOSIS — F4310 Post-traumatic stress disorder, unspecified: Secondary | ICD-10-CM | POA: Diagnosis not present

## 2016-03-09 DIAGNOSIS — F330 Major depressive disorder, recurrent, mild: Secondary | ICD-10-CM

## 2016-03-09 NOTE — Progress Notes (Signed)
   THERAPIST PROGRESS NOTE  Session Time: 2.30pm-3.08pm  Participation Level: Active  Behavioral Response: Fairly GroomedAlertDepressed  Type of Therapy: Individual Therapy  Treatment Goals addressed: Diagnosis: MDD, PTSD and goal 1.  Interventions: CBT, Motivational Interviewing and Supportive  Summary: Wendy Levy is a 20 y.o. female who presents with affect blunted.  Pt reported that she didn't move out of mom's and that she and mom came to agreement.  Pt has to make 5 applications of employment a week and f/u on past applications till obtains employment.  Pt still is able to drive if she is doing this.  Pt reported that mom is helping her w/ the spreadsheet she is to keep and has brought today- showing applications that she has made. Pt reported that she feels that she is complete w/ this for the week- minimal motivation to go beyond.  Pt does reports meeting w/ temp agency tomorrow to discuss possibilities.  Pt was frustrated to her sister is calling on her for rides and that doesn't feel that sister was genuine w/ report of moved to area to be close to her. Pt reported that she isn't sleeping well- reports that wakes from dream fearful and can't get back to sleep, pt reports feels that spacing out as well.  Pt denies any other intrusive thoughts and states that can't remember dreams having.  Pt does report feeling more emotional and will cry easily.     Suicidal/Homicidal: Nowithout intent/plan  Therapist Response: Assessed pt current functioning per pt report.  Explored w/pt how resolved and came to agreement w/ mom.  Discussed pt efforts towards employment and discussed ways she can better indicate job readiness w/ hx of volunteer and presenting professionally.  Processed w/pt her symptoms re: PTSD and self care/coping strategies.    Plan: Return again in 2 weeks.  Diagnosis: MDD, PTSD    YATES,LEANNE, LPC 03/09/2016

## 2016-03-23 ENCOUNTER — Ambulatory Visit (INDEPENDENT_AMBULATORY_CARE_PROVIDER_SITE_OTHER): Payer: BC Managed Care – PPO | Admitting: Psychology

## 2016-03-23 DIAGNOSIS — F330 Major depressive disorder, recurrent, mild: Secondary | ICD-10-CM

## 2016-03-23 DIAGNOSIS — F4310 Post-traumatic stress disorder, unspecified: Secondary | ICD-10-CM

## 2016-03-23 NOTE — Progress Notes (Signed)
   THERAPIST PROGRESS NOTE  Session Time: 2.25pm-3.00pm  Participation Level: Active  Behavioral Response: Well GroomedAlertTired  Type of Therapy: Individual Therapy  Treatment Goals addressed: Diagnosis: MDD, PTSD and goal 1  Interventions: CBT and Supportive  Summary: Wendy Levy is a 20 y.o. female who presents with report of tired and fatigued.  Pt reported she isn't sleeping well.  Pt reports she has difficulty falling asleep- just restless at night. Pt denies any ruminating worries at night. Pt denies nightmares.  Pt does report intrusive thoughts of past.  Pt reports that she had an interview but didn't get the job.  Pt reports she hasn't applied anywhere else this week and doesn't want to work in [place] setting as temp agency suggested.  Pt reports she has been spending time w/her boyfriend out walking around and enjoys this.  Pt denies depressed mood.  Pt reports she is getting along well w/ her mother and having ok interactions w/ her sister. Pt didn't commit to CPT tx for PTSD- wasn't interested in trauma focused work or weekly appointments or practice assignments.    Suicidal/Homicidal: Nowithout intent/plan  Therapist Response: Assessed pt current functioning per pt report.  Explored w/pt her sleep disturbance and potential contributing factors.  Discussed sleep hygiene and potential steps she could take to assist w/ routine for initiating sleep.  Assisted pt w/ identifying next steps w/ job search.  Discussed CPT as potential tx option for pt.  .  Plan: Return again in 2 weeks.  Diagnosis: MDD, PTSD    YATES,LEANNE, LPC 03/23/2016

## 2016-03-25 ENCOUNTER — Encounter (HOSPITAL_COMMUNITY): Payer: Self-pay | Admitting: Psychiatry

## 2016-03-25 ENCOUNTER — Ambulatory Visit (INDEPENDENT_AMBULATORY_CARE_PROVIDER_SITE_OTHER): Payer: BC Managed Care – PPO | Admitting: Psychiatry

## 2016-03-25 VITALS — BP 111/64 | HR 94 | Ht 64.17 in | Wt 261.0 lb

## 2016-03-25 DIAGNOSIS — F900 Attention-deficit hyperactivity disorder, predominantly inattentive type: Secondary | ICD-10-CM

## 2016-03-25 DIAGNOSIS — F341 Dysthymic disorder: Secondary | ICD-10-CM

## 2016-03-25 DIAGNOSIS — F4310 Post-traumatic stress disorder, unspecified: Secondary | ICD-10-CM

## 2016-03-25 DIAGNOSIS — F913 Oppositional defiant disorder: Secondary | ICD-10-CM

## 2016-03-25 MED ORDER — LISDEXAMFETAMINE DIMESYLATE 60 MG PO CAPS: 60.0000 mg | ORAL_CAPSULE | ORAL | Status: DC

## 2016-03-25 MED ORDER — PAROXETINE HCL 20 MG PO TABS: 20.0000 mg | ORAL_TABLET | Freq: Every day | ORAL | Status: DC
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
## 2016-03-29 ENCOUNTER — Other Ambulatory Visit: Payer: Self-pay | Admitting: Physician Assistant

## 2016-03-31 NOTE — Telephone Encounter (Signed)
Patient was informed due for her annual follow up before prescription runs out

## 2016-04-05 ENCOUNTER — Ambulatory Visit (INDEPENDENT_AMBULATORY_CARE_PROVIDER_SITE_OTHER): Payer: BC Managed Care – PPO | Admitting: Psychology

## 2016-04-05 DIAGNOSIS — F330 Major depressive disorder, recurrent, mild: Secondary | ICD-10-CM

## 2016-04-05 DIAGNOSIS — F4310 Post-traumatic stress disorder, unspecified: Secondary | ICD-10-CM

## 2016-04-05 NOTE — Progress Notes (Signed)
   THERAPIST PROGRESS NOTE  Session Time: 1.40pm-2.20pm  Participation Level: Active  Behavioral Response: Well GroomedAlertDepressed  Type of Therapy: Individual Therapy  Treatment Goals addressed: Diagnosis: MDD, PTSDa dn goal 1.  Interventions: CBT and Supportive  Summary: Wendy Levy is a 20 y.o. female who presents with affect WNL.  Pt reported that she has switched medication and not sure if it is helping w/ restless legs.  Pt reports she is sleeping better at night- but reports she is sleeping during day more as well- even prior to medication.  Pt reported worry for friend that has been gone missing for one week.  Pt reported that she is continuing to apply for jobs and hasn't been called for any interviews.  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  Pt receptive to voc rehab as potential resource. .   Suicidal/Homicidal: Nowithout intent/plan  Therapist Response: Assessed pt current functioning per pt report.  Processed w/pt mood, sleep patterns and sleep hygiene that may be important to making change.  Explored w/pt continue job search and discussed potential opportunities to make connections in field she has interest. Plan: Return again in 2 weeks.  Diagnosis: MDD, PTSD    ,, [place] 04/05/2016

## 2016-04-19 ENCOUNTER — Ambulatory Visit (HOSPITAL_COMMUNITY): Payer: Self-pay | Admitting: Psychology

## 2016-04-28 ENCOUNTER — Ambulatory Visit (INDEPENDENT_AMBULATORY_CARE_PROVIDER_SITE_OTHER): Payer: BC Managed Care – PPO | Admitting: Physician Assistant

## 2016-04-28 VITALS — BP 120/84 | HR 113 | Temp 98.2°F | Resp 18 | Ht 64.0 in | Wt 264.0 lb

## 2016-04-28 DIAGNOSIS — N898 Other specified noninflammatory disorders of vagina: Secondary | ICD-10-CM

## 2016-04-28 DIAGNOSIS — R5383 Other fatigue: Secondary | ICD-10-CM

## 2016-04-28 DIAGNOSIS — M25572 Pain in left ankle and joints of left foot: Secondary | ICD-10-CM | POA: Diagnosis not present

## 2016-04-28 DIAGNOSIS — Z3009 Encounter for other general counseling and advice on contraception: Secondary | ICD-10-CM | POA: Diagnosis not present

## 2016-04-28 DIAGNOSIS — R631 Polydipsia: Secondary | ICD-10-CM

## 2016-04-28 DIAGNOSIS — Z118 Encounter for screening for other infectious and parasitic diseases: Secondary | ICD-10-CM | POA: Diagnosis not present

## 2016-04-28 DIAGNOSIS — Z13220 Encounter for screening for lipoid disorders: Secondary | ICD-10-CM

## 2016-04-28 DIAGNOSIS — R195 Other fecal abnormalities: Secondary | ICD-10-CM

## 2016-04-28 DIAGNOSIS — Z0000 Encounter for general adult medical examination without abnormal findings: Secondary | ICD-10-CM

## 2016-04-28 DIAGNOSIS — Z124 Encounter for screening for malignant neoplasm of cervix: Secondary | ICD-10-CM | POA: Diagnosis not present

## 2016-04-28 LAB — POCT WET + KOH PREP
Trich by wet prep: ABSENT
Yeast by KOH: ABSENT
Yeast by wet prep: ABSENT

## 2016-04-28 LAB — COMPREHENSIVE METABOLIC PANEL
ALBUMIN: 4.4 g/dL (ref 3.6–5.1)
ALT: 35 U/L — ABNORMAL HIGH (ref 6–29)
AST: 26 U/L (ref 10–30)
Alkaline Phosphatase: 91 U/L (ref 33–115)
BUN: 11 mg/dL (ref 7–25)
CALCIUM: 9.9 mg/dL (ref 8.6–10.2)
CO2: 18 mmol/L — AB (ref 20–31)
Chloride: 104 mmol/L (ref 98–110)
Creat: 0.72 mg/dL (ref 0.50–1.10)
GLUCOSE: 79 mg/dL (ref 65–99)
POTASSIUM: 4.5 mmol/L (ref 3.5–5.3)
Sodium: 138 mmol/L (ref 135–146)
Total Bilirubin: 0.3 mg/dL (ref 0.2–1.2)
Total Protein: 7.2 g/dL (ref 6.1–8.1)

## 2016-04-28 LAB — LIPID PANEL
CHOL/HDL RATIO: 4.1 ratio (ref <=5.0)
Cholesterol: 209 mg/dL — ABNORMAL HIGH (ref 125–200)
HDL: 51 mg/dL (ref >=46)
LDL CALC: 95 mg/dL (ref <130)
TRIGLYCERIDES: 315 mg/dL — AB (ref <150)
VLDL: 63 mg/dL — ABNORMAL HIGH (ref <30)

## 2016-04-28 LAB — CBC WITH DIFFERENTIAL/PLATELET
BASOS ABS: 0 {cells}/uL (ref 0–200)
Basophils Relative: 0 %
EOS ABS: 268 {cells}/uL (ref 15–500)
Eosinophils Relative: 2 %
HEMATOCRIT: 42.6 % (ref 35.0–45.0)
Hemoglobin: 14 g/dL (ref 11.7–15.5)
LYMPHS ABS: 4020 {cells}/uL — AB (ref 850–3900)
LYMPHS PCT: 30 %
MCH: 27.4 pg (ref 27.0–33.0)
MCHC: 32.9 g/dL (ref 32.0–36.0)
MCV: 83.4 fL (ref 80.0–100.0)
MPV: 9.6 fL (ref 7.5–12.5)
Monocytes Absolute: 804 cells/uL (ref 200–950)
Monocytes Relative: 6 %
NEUTROS PCT: 62 %
Neutro Abs: 8308 cells/uL — ABNORMAL HIGH (ref 1500–7800)
Platelets: 421 10*3/uL — ABNORMAL HIGH (ref 140–400)
RBC: 5.11 MIL/uL — AB (ref 3.80–5.10)
RDW: 14.5 % (ref 11.0–15.0)
WBC: 13.4 10*3/uL — AB (ref 3.8–10.8)

## 2016-04-28 LAB — TSH: TSH: 1.44 m[IU]/L

## 2016-04-28 NOTE — Progress Notes (Signed)
Patient ID: Wendy Levy, female    DOB: [birth year], 20 y.o.   MRN: [id number]  PCP: Lamar Blinks, MD  Chief Complaint  Patient presents with  . Annual Exam    Subjective:   HPI: Patient presents today for annual exam, accompanied by her boyfriend, Raquel Sarna with complaints of:  1. Reports an increase in thirst, hunger, and frequency of urination, heat intolerance, and fatigue which began several months ago.  2. Change in bowel habits for the last 3-4 weeks, BM every 2 hours, "mostly diarrhea, but sometimes its very hard, and there will be blood when I wipe. Diet consist of lots of snack foods, "I dont eat greasy foods b/c it messes up my stomach and I have really stinky poops", she eats very few fruits and vegetables.  3. Reports back ache, "2 weeks ago, I pulled a muscle in my back while pulling a bush out of the ground" Admits to pain at spine and surrounding muscle, has not taken any meds to alleviate, has been less active since injury.  4. She describes cold symptoms on and off, including a runny nose and cough for a few weeks, that wakes her &boyfriend from sleep. Cough is productive of whitish green sputum.  Denies hemoptysis, chest pain, shortness of breath, or dyspnea.  5. Patient describes headaches daily ongoing for over a year, lasting about 2 hours, "with pain behind eye, pounding pain in frontal area and sharp pains on the sides". Relief of headache with 4 - 200mg  ibuprofen 1x day.  6. Ankle pain with ambulation >30-51min starting a few months ago, she wears CAM boot which occasionally helps allievate pain.  -Review of her chart indicates chronic ankle pain dating back to an injury 2 years ago, 06/15/14 an X-ray showed a subacute fracture. Patient underwent PT, reported improvement initially, but it has since continued to be bothersome. Repeat x-ray 04/24/15 showed a healed fracture, and bony protrusion. At that visit this office put in a referral to  Ortho, and placed her in a sweed-o brace. She did PT to rehab the ankle after the injury, but is no longer doing the home exercise program.  7. She reports not sleeping as well lately, waking nightly and not being able to go back to sleep.   8. Requesting refill of contraception pills, but is interested in LARC method.    Patient Active Problem List   Diagnosis Date Noted  . PTSD (post-traumatic stress disorder) 12/30/2015  . BMI 40.0-44.9, adult ([place]) 02/05/2015  . Migraine, unspecified, without mention of intractable migraine without mention of status migrainosus 03/15/2013  . Major depressive disorder, recurrent episode ([place]) 01/31/2012  . ADHD (attention deficit hyperactivity disorder), combined type 11/02/2011  . ODD (oppositional defiant disorder) 11/02/2011  . Dysthymic disorder 11/02/2011    Past Medical History  Diagnosis Date  . ADHD (attention deficit hyperactivity disorder)   . Depression   . Oppositional defiant disorder   . Wrist fracture, bilateral   . Shoulder dislocation     bilateral  . Allergy   . Anxiety      Prior to Admission medications   Medication Sig Start Date End Date Taking? Authorizing Provider  cetirizine (ZYRTEC) 10 MG tablet Take 10 mg by mouth daily as needed for allergies.   Yes Historical Provider, MD  lisdexamfetamine (VYVANSE) 60 MG capsule Take 1 capsule (60 mg total) by mouth every morning. 03/25/16 07/02/16 Yes [name], MD  LUTERA 0.1-20 MG-MCG tablet TAKE 1 TABLET BY MOUTH EVERY DAY 03/31/16  Yes Dwyane Dupree, PA-C  Multiple Vitamin (MULTIVITAMIN) capsule Take 1 capsule by mouth daily.   Yes Historical Provider, MD  PARoxetine (PAXIL) 20 MG tablet Take 1 tablet (20 mg total) by mouth daily. 03/25/16 03/25/17 Yes [name], MD    Allergies  Allergen Reactions  . Benadryl Anti-Itch Childrens [Camphor] Rash    Rash with topical product    Past Surgical History  Procedure Laterality Date  . Wisdom tooth extraction  04/2013      Family History  Problem Relation Age of Onset  . Adopted: Yes  . Depression Sister     reportedly in and out of treatment facilities, also dx w/ RAD  . Suicidality Sister     Social History   Social History  . Marital Status: Single    Spouse Name: n/a  . Number of Children: 0  . Years of Education: N/A   Occupational History  . Programmer, applications   Social History Main Topics  . Smoking status: Never Smoker   . Smokeless tobacco: Never Used  . Alcohol Use: No  . Drug Use: No  . Sexual Activity:    Partners: Male    Birth Control/ Protection: Pill   Other Topics Concern  . None   Social History Narrative   Lives with her mother (adopted).  Her sister lives in [place], [place] with her own son Jamse Arn, born 12/31/2014.   Graduated from [employer].       Review of Systems Constitutional: Positive for appetite change and fatigue. Negative for fever.  HENT: Positive for congestion, ear pain and nosebleeds.  Eyes: Negative.  Respiratory: Positive for cough. Negative for chest tightness, shortness of breath and wheezing.  Cardiovascular: Negative for chest pain.  Gastrointestinal: Positive for diarrhea, constipation and rectal pain.  Endocrine: Positive for heat intolerance, polyphagia and polyuria. Negative for polydipsia.  Genitourinary: Negative for hematuria and difficulty urinating.  Musculoskeletal: Positive for back pain.  Allergic/Immunologic: Positive for environmental allergies.  Neurological: Positive for headaches.  Psychiatric/Behavioral: Positive for sleep disturbance and agitation. Negative for suicidal ideas, hallucinations and self-injury. The patient is not nervous/anxious.      Objective:  Physical Exam  Constitutional: She is oriented to person, place, and time. Vital signs are normal. She appears well-developed and well-nourished. She is active and cooperative. No distress.  BP 120/84 mmHg  Pulse 113  Temp(Src) 98.2 F  (36.8 C) (Oral)  Resp 18  Ht 5\' 4"  (1.626 m)  Wt 264 lb (119.75 kg)  BMI 45.29 kg/m2  SpO2 99%  LMP 04/20/2016   HENT:  Head: Normocephalic and atraumatic.  Right Ear: Hearing, tympanic membrane, external ear and ear canal normal. No foreign bodies.  Left Ear: Hearing, tympanic membrane, external ear and ear canal normal. No foreign bodies.  Nose: Nose normal.  Mouth/Throat: Uvula is midline, oropharynx is clear and moist and mucous membranes are normal. No oral lesions. Normal dentition. No dental abscesses or uvula swelling. No oropharyngeal exudate.  Eyes: Conjunctivae, EOM and lids are normal. Pupils are equal, round, and reactive to light. Right eye exhibits no discharge. Left eye exhibits no discharge. No scleral icterus.  Fundoscopic exam:      The right eye shows no arteriolar narrowing, no AV nicking, no exudate, no hemorrhage and no papilledema.       The left eye shows no arteriolar narrowing, no AV nicking, no exudate, no hemorrhage and no papilledema.  Neck: Trachea normal, normal range of motion and  full passive range of motion without pain. Neck supple. No spinous process tenderness and no muscular tenderness present. No thyroid mass and no thyromegaly present.  Cardiovascular: Normal rate, regular rhythm, normal heart sounds, intact distal pulses and normal pulses.   Pulmonary/Chest: Effort normal and breath sounds normal. She exhibits no tenderness and no retraction. Right breast exhibits no inverted nipple, no mass, no nipple discharge, no skin change and no tenderness. Left breast exhibits no inverted nipple, no mass, no nipple discharge, no skin change and no tenderness. Breasts are symmetrical.  Abdominal: Soft. Normal appearance and bowel sounds are normal. She exhibits no distension and no mass. There is no hepatosplenomegaly. There is no tenderness. There is no rigidity, no rebound, no guarding, no CVA tenderness, no tenderness at McBurney's point and negative Murphy's  sign. No hernia. Hernia confirmed negative in the right inguinal area and confirmed negative in the left inguinal area.  Genitourinary: Rectum normal, vagina normal and uterus normal. Rectal exam shows no external hemorrhoid and no fissure. No breast swelling, tenderness, discharge or bleeding. Pelvic exam was performed with patient supine. No labial fusion. There is no rash, tenderness, lesion or injury on the right labia. There is no rash, tenderness, lesion or injury on the left labia. Cervix exhibits no motion tenderness, no discharge and no friability. Right adnexum displays no mass, no tenderness and no fullness. Left adnexum displays no mass, no tenderness and no fullness. No erythema, tenderness or bleeding in the vagina. No foreign body around the vagina. No signs of injury around the vagina. No vaginal discharge found.  Musculoskeletal: She exhibits no edema or tenderness.       Cervical back: Normal.       Thoracic back: Normal.       Lumbar back: Normal.  Lymphadenopathy:       Head (right side): No tonsillar, no preauricular, no posterior auricular and no occipital adenopathy present.       Head (left side): No tonsillar, no preauricular, no posterior auricular and no occipital adenopathy present.    She has no cervical adenopathy.    She has no axillary adenopathy.       Right: No inguinal and no supraclavicular adenopathy present.       Left: No inguinal and no supraclavicular adenopathy present.  Neurological: She is alert and oriented to person, place, and time. She has normal strength and normal reflexes. No cranial nerve deficit. She exhibits normal muscle tone. Coordination and gait normal.  Skin: Skin is warm, dry and intact. Ecchymosis (symmetric round ecchymotic areas in resolution on the upper outer quadrant of each breast) and lesion (hyperpigmented macules/papules under boths breasts; acanthosis nigricans) noted. No rash noted. She is not diaphoretic. No cyanosis or erythema.  Nails show no clubbing.  Psychiatric: She has a normal mood and affect. Her speech is normal and behavior is normal. Judgment and thought content normal.    Wt Readings from Last 3 Encounters:  04/28/16 264 lb (119.75 kg)  03/25/16 261 lb (118.389 kg)  12/25/15 260 lb 9.6 oz (118.207 kg)     Results for orders placed or performed in visit on 04/28/16  POCT Wet + KOH Prep  Result Value Ref Range   Yeast by KOH Absent Present, Absent   Yeast by wet prep Absent Present, Absent   WBC by wet prep None None, Few, Too numerous to count   Clue Cells Wet Prep HPF POC Few (A) None, Too numerous to count   Trich by  wet prep Absent Present, Absent   Bacteria Wet Prep HPF POC Many (A) None, Few, Too numerous to count   Epithelial Cells By [employer] Pref ([facility]) Many (A) None, Few, Too numerous to count   RBC,UR,HPF,POC Few (A) None RBC/hpf         Assessment & Plan:  1. Annual physical exam Age appropriate anticipatory guidance provided.  2. Screening for cervical cancer 3. Screening for chlamydial disease - Pap IG, CT/NG w/ reflex HPV when ASC-U   4. Screening for hyperlipidemia - Lipid panel  5. Polydipsia She was unable to provide a urine specimen. Await glucose. - POCT urinalysis dipstick - POCT Microscopic Urinalysis ([facility])  6. Change in stool Unclear etiology. Await lab results.  7. Pain in joint, ankle and foot, left Resume use of brace for extended standing/walking. Resume HEP. If persists, will refer back to PT.  8. Other fatigue Await la results. - CBC with Differential/Platelet - Comprehensive metabolic panel - TSH  9. Vaginal discharge Few Clue Cells. Await pap, CT results. - POCT Wet + KOH Prep  10. Encounter for other general counseling or advice on contraception Continue COC for now. Refer to GYN to discuss LARC options. She is interested in IUD. - Ambulatory referral to Gynecology   Fara Chute, PA-C Physician Assistant-Certified [facility]

## 2016-04-28 NOTE — Progress Notes (Signed)
Subjective:    Patient ID: Wendy Levy, female    DOB: 01/23/1996, 20 y.o.   MRN: [id number]   Chief Complaint  Patient presents with  . Annual Exam   HPI  Patient presents today for annual exam with complaints of:  Reports an increase in thirst, hunger, and frequency of urination, heat intolerance, and fatigue which began several months ago.  Change in bowel habits for the last 3-4 weeks, BM every 2 hours, "mostly diarrhea, but sometimes its very hard, and there will be blood when I wipe.  Diet consist of lots of snack foods, "I dont eat greasy foods b/c it messes up my stomach and I have really stinky poops", she eats very few fruits and vegetables.  Reports back ache, "2 weeks ago, I pulled a muscle in my back while pulling a bush out of the ground" Admits to pain at spine and surrounding muscle, has not taken any meds to alleviate, has been less active since injury.   She describes cold symptoms on and off, including a runny nose and cough for a few weeks, that wakes her &boyfriend  from sleep.  Cough is productive of whitish green sputum.   Denies hemoptysis, chest pain, shortness of breath, or dyspnea.  Patient describes headaches daily ongoing for over a year, lasting about 2 hours, "with pain behind eye, pounding pain in frontal area and sharp pains on the sides". Relief of headache with 4 - 200mg  ibuprofen 1x day.  Ankle pain with ambulation >30-18min starting a few months ago, she wears CAM boot which occasionally helps allievate pain.   -Review of her chart indicates chronic ankle pain dating back to an injury 2 years ago, 06/15/14 an X-ray showed a subacute fracture. Patient underwent PT, reported improvement initially, but it has since continued to be bothersome. Repeat x-ray 04/24/15 showed a healed fracture, and bony protrusion.  At that visit this office put in a referral to Ortho, and placed her in a sweed-o brace.  She reports not sleeping as well lately, waking  nightly and not being able to go back to sleep.    Requesting refill of contraception pills.   Review of Systems  Constitutional: Positive for appetite change and fatigue. Negative for fever.  HENT: Positive for congestion, ear pain and nosebleeds.   Eyes: Negative.   Respiratory: Positive for cough. Negative for chest tightness, shortness of breath and wheezing.   Cardiovascular: Negative for chest pain.  Gastrointestinal: Positive for diarrhea, constipation and rectal pain.  Endocrine: Positive for heat intolerance, polyphagia and polyuria. Negative for polydipsia.  Genitourinary: Negative for hematuria and difficulty urinating.  Musculoskeletal: Positive for back pain.  Allergic/Immunologic: Positive for environmental allergies.  Neurological: Positive for headaches.  Psychiatric/Behavioral: Positive for sleep disturbance and agitation. Negative for suicidal ideas, hallucinations and self-injury. The patient is not nervous/anxious.    Patient Active Problem List   Diagnosis Date Noted  . PTSD (post-traumatic stress disorder) 12/30/2015  . BMI 40.0-44.9, adult ([place]) 02/05/2015  . Migraine, unspecified, without mention of intractable migraine without mention of status migrainosus 03/15/2013  . Major depressive disorder, recurrent episode ([place]) 01/31/2012  . ADHD (attention deficit hyperactivity disorder), combined type 11/02/2011  . ODD (oppositional defiant disorder) 11/02/2011  . Dysthymic disorder 11/02/2011   Current Outpatient Prescriptions on File Prior to Visit  Medication Sig Dispense Refill  . cetirizine (ZYRTEC) 10 MG tablet Take 10 mg by mouth daily as needed for allergies.    Marland Kitchen lisdexamfetamine (VYVANSE) 60 MG capsule  Take 1 capsule (60 mg total) by mouth every morning. 90 capsule 0  . LUTERA 0.1-20 MG-MCG tablet TAKE 1 TABLET BY MOUTH EVERY DAY 84 tablet 0  . Multiple Vitamin (MULTIVITAMIN) capsule Take 1 capsule by mouth daily.    Marland Kitchen PARoxetine (PAXIL) 20 MG tablet  Take 1 tablet (20 mg total) by mouth daily. 90 tablet 0   No current facility-administered medications on file prior to visit.   Allergies  Allergen Reactions  . Benadryl Anti-Itch Childrens [Camphor] Rash    Rash with topical product   Social History   Social History  . Marital Status: Single    Spouse Name: n/a  . Number of Children: 0  . Years of Education: N/A   Occupational History  . looking for work    Social History Main Topics  . Smoking status: Never Smoker   . Smokeless tobacco: Never Used  . Alcohol Use: No  . Drug Use: No  . Sexual Activity:    Partners: Male    Birth Control/ Protection: Pill   Other Topics Concern  . Not on file   Social History Narrative   Lives with her mother (adopted).  Her sister lives in [place], [place] with her own son Jamse Arn, born 12/31/2014.   Graduated from [employer]. Some classes at [employer].   Family History  Problem Relation Age of Onset  . Adopted: Yes  . Depression Sister     reportedly in and out of treatment facilities, also dx w/ RAD  . Suicidality Sister        Objective: BP 120/84 mmHg  Pulse 113  Temp(Src) 98.2 F (36.8 C) (Oral)  Resp 18  Ht 5\' 4"  (1.626 m)  Wt 264 lb (119.75 kg)  BMI 45.29 kg/m2  SpO2 99%  LMP 04/20/2016   Physical Exam  Constitutional: She is oriented to person, place, and time. She appears well-developed and well-nourished.  HENT:  Right Ear: Hearing, tympanic membrane, external ear and ear canal normal.  Left Ear: Hearing, tympanic membrane, external ear and ear canal normal.  Neck: Normal range of motion. Neck supple. No tracheal deviation present. No thyroid mass and no thyromegaly present.    Cardiovascular: Regular rhythm, normal heart sounds and intact distal pulses.  Tachycardia present.  Exam reveals no gallop and no friction rub.   No murmur heard. Pulses:      Dorsalis pedis pulses are 2+ on the right side, and 1+ on the left side.       Posterior tibial pulses  are 1+ on the right side, and 1+ on the left side.  Pulmonary/Chest: Effort normal and breath sounds normal. Right breast exhibits no inverted nipple, no mass, no nipple discharge, no skin change and no tenderness. Left breast exhibits no inverted nipple, no mass, no nipple discharge, no skin change and no tenderness. Breasts are symmetrical.  Genitourinary: Uterus normal. There is no rash, tenderness, lesion or injury on the right labia. There is no rash, tenderness, lesion or injury on the left labia. No erythema, tenderness or bleeding in the vagina. No signs of injury around the vagina. Vaginal discharge found.  Musculoskeletal: Normal range of motion.       Lumbar back: She exhibits tenderness.       Back:       Feet:  Neurological: She is alert and oriented to person, place, and time. She has normal strength. She displays abnormal reflex.  Reflex Scores:      Bicep reflexes are  3+ on the right side and 3+ on the left side.      Brachioradialis reflexes are 3+ on the right side and 3+ on the left side.      Patellar reflexes are 2+ on the right side and 2+ on the left side.      Achilles reflexes are 2+ on the right side and 2+ on the left side. Skin: Skin is warm, dry and intact. Ecchymosis and rash noted. Rash is maculopapular.     Psychiatric: She has a normal mood and affect. Her speech is normal and behavior is normal.       Assessment & Plan:   1. Annual physical exam Provided patient with education about maintaining a healthy lifestyle, including diet and exercise,  Screening test, important vitamins, medications and immunizations.  2. Screening for cervical cancer -Pelvic exam with PAP performed, results pending. - Pap IG, CT/NG w/ reflex HPV when ASC-U  3. Screening for chlamydial disease -Urine sample collected for GC/chylamydia testing, resulting pending -Patient informed we will provide treatment if indicated by results.  4. Screening for hyperlipidemia - Lipid  panel collected, results pending -will begin medical management if indicated by results.  5. Polydipsia Labs pending, will address results, report to patient and determine the best management plan based on what is indicated. - POCT urinalysis dipstick - POCT Microscopic Urinalysis ([facility])  6. Change in stool Pending labs to determine possible cause of change, if no cause determined, refer to GI.  7. Pain in joint, ankle and foot, left Discussed chronic ankle pain with patient and importance of continuing PT exercises that were previously beneficial in pain relief.  If the CAM boot is helpful with relief continue to use as necessary.  8. Other fatigue Labs pending to determine possible source, will address management necessary when results are complete: - CBC with Differential/Platelet - Comprehensive metabolic panel - TSH  9. Vaginal discharge Colleted with pelvic exam, lab results pending. - POCT Wet + KOH Prep  10. Encounter for other general counseling or advice on contraception Discussed with patient different types of contraception including the efficacy of each, she would like to have an IUD placed so referred to GYN.  Patient birth control pills refilled to take until IUD can be placed. - Ambulatory referral to Gynecology  Discussed with patient that once labs have resulted, this office will contact her with the results and address any abnormalities that need to be managed.  If all results are normal, patient can follow up in 1 year for annual exam, or sooner if a problem arises.  Dishon Kehoe P. Romeka Scifres, PA-S

## 2016-04-28 NOTE — Patient Instructions (Addendum)
   IF you received an x-ray today, you will receive an invoice from [facility]. Please contact [facility] at [phone] with questions or concerns regarding your invoice.   IF you received labwork today, you will receive an invoice from [facility]. Please contact [facility] at [phone] with questions or concerns regarding your invoice.   Our billing staff will not be able to assist you with questions regarding bills from these companies.  You will be contacted with the lab results as soon as they are available. The fastest way to get your results is to activate your My Chart account. Instructions are located on the last page of this paperwork. If you have not heard from us regarding the results in 2 weeks, please contact this office.     Keeping You Healthy  Get These Tests 1. Blood Pressure- Have your blood pressure checked once a year by your health care provider.  Normal blood pressure is 120/80. 2. Weight- Have your body mass index (BMI) calculated to screen for obesity.  BMI is measure of body fat based on height and weight.  You can also calculate your own BMI at www.nhlbisupport.com/bmi/. 3. Cholesterol- Have your cholesterol checked every 5 years starting at age 20 then yearly starting at age 45. 4. Chlamydia, HIV, and other sexually transmitted diseases- Get screened every year until age 25, then within three months of each new sexual provider. 5. Pap Test - Every 1-5 years; discuss with your health care provider. 6. Mammogram- Every 1-2 years starting at age 40--50  Take these medicines  Calcium with Vitamin D-Your body needs 1200 mg of Calcium each day and 800-1000 IU of Vitamin D daily.  Your body can only absorb 500 mg of Calcium at a time so Calcium must be taken in 2 or 3 divided doses throughout the day.  Multivitamin with folic acid- Once daily if it is possible for you to become pregnant.  Get these  Immunizations  Gardasil-Series of three doses; prevents HPV related illness such as genital warts and cervical cancer.  Menactra-Single dose; prevents meningitis.  Tetanus shot- Every 10 years.  Flu shot-Every year.  Take these steps 1. Do not smoke-Your healthcare provider can help you quit.  For tips on how to quit go to www.smokefree.gov or call 1-800 QUITNOW. 2. Be physically active- Exercise 5 days a week for at least 30 minutes.  If you are not already physically active, start slow and gradually work up to 30 minutes of moderate physical activity.  Examples of moderate activity include walking briskly, dancing, swimming, bicycling, etc. 3. Breast Cancer- A self breast exam every month is important for early detection of breast cancer.  For more information and instruction on self breast exams, ask your healthcare provider or www.womenshealth.gov/faq/breast-self-exam.cfm. 4. Eat a healthy diet- Eat a variety of healthy foods such as fruits, vegetables, whole grains, low fat milk, low fat cheeses, yogurt, lean meats, poultry and fish, beans, nuts, tofu, etc.  For more information go to www. Thenutritionsource.org 5. Drink alcohol in moderation- Limit alcohol intake to one drink or less per day. Never drink and drive. 6. Depression- Your emotional health is as important as your physical health.  If you're feeling down or losing interest in things you normally enjoy please talk to your healthcare provider about being screened for depression. 7. Dental visit- Brush and floss your teeth twice daily; visit your dentist twice a year. 8. Eye doctor- Get an eye exam at least every   2 years. 9. Helmet use- Always wear a helmet when riding a bicycle, motorcycle, rollerblading or skateboarding. 10. Safe sex- If you may be exposed to sexually transmitted infections, use a condom. 11. Seat belts- Seat belts can save your live; always wear one. 12. Smoke/Carbon Monoxide detectors- These detectors need to  be installed on the appropriate level of your home. Replace batteries at least once a year. 13. Skin cancer- When out in the sun please cover up and use sunscreen 15 SPF or higher. 14. Violence- If anyone is threatening or hurting you, please tell your healthcare provider.        

## 2016-04-29 ENCOUNTER — Telehealth: Payer: Self-pay | Admitting: Obstetrics and Gynecology

## 2016-04-29 LAB — PAP IG, CT-NG, RFX HPV ASCU
CHLAMYDIA PROBE AMP: NOT DETECTED
GC Probe Amp: NOT DETECTED

## 2016-04-29 NOTE — Telephone Encounter (Signed)
Called and left a message for patient to call back to schedule a new patient doctor referral. °

## 2016-05-03 ENCOUNTER — Ambulatory Visit (INDEPENDENT_AMBULATORY_CARE_PROVIDER_SITE_OTHER): Payer: BC Managed Care – PPO | Admitting: Psychology

## 2016-05-03 DIAGNOSIS — F330 Major depressive disorder, recurrent, mild: Secondary | ICD-10-CM

## 2016-05-03 NOTE — Progress Notes (Signed)
   THERAPIST PROGRESS NOTE  Session Time: 2.29pm-2.58pm  Participation Level: Active  Behavioral Response: Well GroomedAlertaffect WNL  Type of Therapy: Individual Therapy  Treatment Goals addressed: Diagnosis: MDD and goal 1  Interventions: CBT and Supportive  Summary: Shata Galas is a 20 y.o. female who presents with affect WNL.  Pt reported mood has been ok.  Pt reports that sleeping has improved- still times w/ night time awakenings and cant get back to sleep- couple of nights a week.  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  Pt reported that she doesn't have any recent stressors.     Suicidal/Homicidal: Nowithout intent/plan  Therapist Response: Assessed pt current functioning per pt report.  Processed w/pt her mood and sleep disturbance progress.  Explored w/ pt progress towards her goals of job and encouraged continued efforts.   Plan: Return again in 3 weeks.  Diagnosis: MDD    Jan Fireman, [facility] 05/03/2016

## 2016-05-04 ENCOUNTER — Encounter: Payer: Self-pay | Admitting: Physician Assistant

## 2016-05-04 MED ORDER — FLUCONAZOLE 150 MG PO TABS: 150.0000 mg | ORAL_TABLET | Freq: Once | ORAL | Status: DC

## 2016-05-04 NOTE — Addendum Note (Signed)
Addended by: Fara Chute on: 05/04/2016 01:57 PM   Modules accepted: Orders, SmartSet

## 2016-05-06 ENCOUNTER — Encounter: Payer: Self-pay | Admitting: Obstetrics and Gynecology

## 2016-05-06 ENCOUNTER — Ambulatory Visit (INDEPENDENT_AMBULATORY_CARE_PROVIDER_SITE_OTHER): Payer: BC Managed Care – PPO | Admitting: Obstetrics and Gynecology

## 2016-05-06 VITALS — BP 112/80 | HR 78 | Resp 16 | Ht 64.5 in | Wt 268.0 lb

## 2016-05-06 DIAGNOSIS — Z3009 Encounter for other general counseling and advice on contraception: Secondary | ICD-10-CM | POA: Diagnosis not present

## 2016-05-06 DIAGNOSIS — N946 Dysmenorrhea, unspecified: Secondary | ICD-10-CM

## 2016-05-06 DIAGNOSIS — R194 Change in bowel habit: Secondary | ICD-10-CM

## 2016-05-06 DIAGNOSIS — R87612 Low grade squamous intraepithelial lesion on cytologic smear of cervix (LGSIL): Secondary | ICD-10-CM

## 2016-05-06 DIAGNOSIS — R109 Unspecified abdominal pain: Secondary | ICD-10-CM | POA: Diagnosis not present

## 2016-05-06 LAB — POCT URINE PREGNANCY: PREG TEST UR: NEGATIVE

## 2016-05-06 MED ORDER — NAPROXEN SODIUM 550 MG PO TABS: ORAL_TABLET | ORAL | Status: DC

## 2016-05-06 NOTE — Progress Notes (Signed)
20 y.o. No obstetric history on file. SingleCaucasianF here for a consultation from [facility] PA-C for contraception and an abnormal pap smear. Pap last month returned with LSIL.  This was her first pap.  She has been on OCP's since 2014, she stopped taking it with her last cycle because she feels her cramps have been getting worse.  Menses have been coming in the middle of her pack. She typically bleeds x 2 days (on the pill), very light. She had heavy, crampy cycles prior to OCP's. Initially her cramps improved on OCP's. Over the last few months her cramps have gotten worse, now coming throughout the month, random. Cramps typically last 1-1.5 hours, range from 5-10/10 in severity. The cramps can be diffuse, throughout her abdomen. With her cycle the cramps are in her lower abdomen only.  She is sexually active, with her partner since January, 2017. Not using condoms. No protection this month. No pain with intercourse.  She had a negative GC/CT with her primary on 04/28/16. She had chlamydia last year, treated as an outpatient.  She reports hard BM's, then watery BM. She is having BM's 6-7 x a day for the last week. Prior to that 1-2 x a day, and normal. Her cramps have been worse since yesterday.  She just started medication for a yeast infection.     Patient's last menstrual period was 04/20/2016.          Sexually active: Yes.    The current method of family planning is OCP (estrogen/progesterone).    Exercising: No.  The patient does not participate in regular exercise at present. Smoker:  no  Health Maintenance: Pap:  04/28/2016-LGSIL w/Mild Dysplasia History of abnormal Pap:  yes MMG:  No Colonoscopy:  No BMD:   No TDaP:  04/03/14 Gardasil: 2008-Completed all 3   reports that she has never smoked. She has never used smokeless tobacco. She reports that she does not drink alcohol or use illicit drugs. She is going to start working tomorrow, house keeping.   Past Medical History   Diagnosis Date  . ADHD (attention deficit hyperactivity disorder)   . Depression   . Oppositional defiant disorder   . Wrist fracture, bilateral   . Shoulder dislocation     bilateral  . Allergy   . Anxiety   . Dysmenorrhea     Cramps periodically.     Past Surgical History  Procedure Laterality Date  . Wisdom tooth extraction  04/2013    Current Outpatient Prescriptions  Medication Sig Dispense Refill  . cetirizine (ZYRTEC) 10 MG tablet Take 10 mg by mouth daily as needed for allergies.    . fluconazole (DIFLUCAN) 150 MG tablet Take 1 tablet (150 mg total) by mouth once. Repeat if needed 2 tablet 0  . lisdexamfetamine (VYVANSE) 60 MG capsule Take 1 capsule (60 mg total) by mouth every morning. 90 capsule 0  . LUTERA 0.1-20 MG-MCG tablet TAKE 1 TABLET BY MOUTH EVERY DAY 84 tablet 0  . Multiple Vitamin (MULTIVITAMIN) capsule Take 1 capsule by mouth daily.    Marland Kitchen PARoxetine (PAXIL) 20 MG tablet Take 1 tablet (20 mg total) by mouth daily. 90 tablet 0  . naproxen sodium (ANAPROX DS) 550 MG tablet 1 tab po q 12 hours prn pain 30 tablet 2   No current facility-administered medications for this visit.    Family History  Problem Relation Age of Onset  . Adopted: Yes  . Depression Sister     reportedly in and  out of treatment facilities, also dx w/ RAD  . Suicidality Sister     Review of Systems  Exam:   BP 112/80 mmHg  Pulse 78  Resp 16  Ht 5' 4.5" (1.638 m)  Wt 268 lb (121.564 kg)  BMI 45.31 kg/m2  LMP 04/20/2016  Weight change: @WEIGHTCHANGE @ Height:   Height: 5' 4.5" (163.8 cm)  Ht Readings from Last 3 Encounters:  05/06/16 5' 4.5" (1.638 m)  04/28/16 5\' 4"  (1.626 m)  03/25/16 5' 4.17" (1.63 m)    General appearance: alert, cooperative and appears stated age Head: Normocephalic, without obvious abnormality, atraumatic Neck: no adenopathy, supple, symmetrical, trachea midline and thyroid normal to inspection and palpation Lungs: clear to auscultation  bilaterally Heart: regular rate and rhythm Abdomen: soft, non-tender; bowel sounds normal; no masses,  no organomegaly Extremities: extremities normal, atraumatic, no cyanosis or edema Skin: Skin color, texture, turgor normal. No rashes or lesions Lymph nodes: Cervical, supraclavicular, and axillary nodes normal. No abnormal inguinal nodes palpated Neurologic: Grossly normal   Pelvic: External genitalia:  Erythematous, no lesions              Urethra:  normal appearing urethra with no masses, tenderness or lesions              Bartholins and Skenes: normal                 Vagina: normal appearing vagina with a slight increase in thick, white vaginal d/c              Cervix: no lesions               Bimanual Exam:  Uterus:  normal size, contour, position, consistency, mobility, non-tender              Adnexa: no mass, fullness, tenderness               Rectovaginal: Confirms               Anus:  normal sphincter tone, no lesions  Chaperone was present for exam.  A:  LSIL pap smear, reviewed with the patient that most women her age clear the HPV virus. The recommendation at her age is to repeat the pap in 1 year  Abdominal pain, diffuse  Bowel changes over the last week, hard to watery BM's, many times a day  Contraception. Discussed options, including OCP's, depo-provera, nexplanon and IUD's. She is interested in the nexplanon  Yeast infection, just started treatment  Dysmenorrhea  P:   She should have a f/u pap in one year with her primary  Return for nexplanon insertion, side effects reviewed, information given  Given information on diet for IBS  She should f/u with her primary or GI for further evaluation of her diffuse abdominal pain and GI symptoms  UPT negative  Condoms encouraged for STD protection and currently for contraception  Anaprox for dysmenorrhea  CC: Harrison Mons, PA-C Letter sent

## 2016-05-06 NOTE — Patient Instructions (Addendum)
Call at the beginning of your cycle for Nexplanon insertion      Diet for Irritable Bowel Syndrome When you have irritable bowel syndrome (IBS), the foods you eat and your eating habits are very important. IBS may cause various symptoms, such as abdominal pain, constipation, or diarrhea. Choosing the right foods can help ease discomfort caused by these symptoms. Work with your health care provider and dietitian to find the best eating plan to help control your symptoms. WHAT GENERAL GUIDELINES DO I NEED TO FOLLOW?  Keep a food diary. This will help you identify foods that cause symptoms. Write down:  What you eat and when.  What symptoms you have.  When symptoms occur in relation to your meals.  Avoid foods that cause symptoms. Talk with your dietitian about other ways to get the same nutrients that are in these foods.  Eat more foods that contain fiber. Take a fiber supplement if directed by your dietitian.  Eat your meals slowly, in a relaxed setting.  Aim to eat 5-6 small meals per day. Do not skip meals.  Drink enough fluids to keep your urine clear or pale yellow.  Ask your health care provider if you should take an over-the-counter probiotic during flare-ups to help restore healthy gut bacteria.  If you have cramping or diarrhea, try making your meals low in fat and high in carbohydrates. Examples of carbohydrates are pasta, rice, whole grain breads and cereals, fruits, and vegetables.  If dairy products cause your symptoms to flare up, try eating less of them. You might be able to handle yogurt better than other dairy products because it contains bacteria that help with digestion. WHAT FOODS ARE NOT RECOMMENDED? The following are some foods and drinks that may worsen your symptoms:  Fatty foods, such as [place] fries.  Milk products, such as cheese or ice cream.  Chocolate.  Alcohol.  Products with caffeine, such as coffee.  Carbonated drinks, such as soda. The  items listed above may not be a complete list of foods and beverages to avoid. Contact your dietitian for more information. WHAT FOODS ARE GOOD SOURCES OF FIBER? Your health care provider or dietitian may recommend that you eat more foods that contain fiber. Fiber can help reduce constipation and other IBS symptoms. Add foods with fiber to your diet a little at a time so that your body can get used to them. Too much fiber at once might cause gas and swelling of your abdomen. The following are some foods that are good sources of fiber:  Apples.  Peaches.  Pears.  Berries.  Figs.  Broccoli (raw).  Cabbage.  Carrots.  Raw peas.  Kidney beans.  Lima beans.  Whole grain bread.  Whole grain cereal. FOR MORE INFORMATION  International Foundation for Functional Gastrointestinal Disorders: www.iffgd.[employer] of Diabetes and Digestive and Kidney Diseases: NetworkAffair.co.za.aspx   This information is not intended to replace advice given to you by your health care provider. Make sure you discuss any questions you have with your health care provider.   Document Released: 02/05/2004 Document Revised: 12/06/2014 Document Reviewed: 02/15/2014 Elsevier Interactive Patient Education 2016 [place] What is this medicine? ETONOGESTREL (et oh noe JES trel) is a contraceptive (birth control) device. It is used to prevent pregnancy. It can be used for up to 3 years. This medicine may be used for other purposes; ask your health care provider or pharmacist if you have questions. What should I tell my health care provider  before I take this medicine? They need to know if you have any of these conditions: -abnormal vaginal bleeding -blood vessel disease or blood clots -cancer of the breast, cervix, or liver -depression -diabetes -gallbladder disease -headaches -heart disease or recent  heart attack -high blood pressure -high cholesterol -kidney disease -liver disease -renal disease -seizures -tobacco smoker -an unusual or allergic reaction to etonogestrel, other hormones, anesthetics or antiseptics, medicines, foods, dyes, or preservatives -pregnant or trying to get pregnant -breast-feeding How should I use this medicine? This device is inserted just under the skin on the inner side of your upper arm by a health care professional. Talk to your pediatrician regarding the use of this medicine in children. Special care may be needed. Overdosage: If you think you have taken too much of this medicine contact a poison control center or emergency room at once. NOTE: This medicine is only for you. Do not share this medicine with others. What if I miss a dose? This does not apply. What may interact with this medicine? Do not take this medicine with any of the following medications: -amprenavir -bosentan -fosamprenavir This medicine may also interact with the following medications: -barbiturate medicines for inducing sleep or treating seizures -certain medicines for fungal infections like ketoconazole and itraconazole -griseofulvin -medicines to treat seizures like carbamazepine, felbamate, oxcarbazepine, phenytoin, topiramate -modafinil -phenylbutazone -rifampin -some medicines to treat HIV infection like atazanavir, indinavir, lopinavir, nelfinavir, tipranavir, ritonavir -St. John's wort This list may not describe all possible interactions. Give your health care provider a list of all the medicines, herbs, non-prescription drugs, or dietary supplements you use. Also tell them if you smoke, drink alcohol, or use illegal drugs. Some items may interact with your medicine. What should I watch for while using this medicine? This product does not protect you against HIV infection (AIDS) or other sexually transmitted diseases. You should be able to feel the implant by pressing  your fingertips over the skin where it was inserted. Contact your doctor if you cannot feel the implant, and use a non-hormonal birth control method (such as condoms) until your doctor confirms that the implant is in place. If you feel that the implant may have broken or become bent while in your arm, contact your healthcare provider. What side effects may I notice from receiving this medicine? Side effects that you should report to your doctor or health care professional as soon as possible: -allergic reactions like skin rash, itching or hives, swelling of the face, lips, or tongue -breast lumps -changes in emotions or moods -depressed mood -heavy or prolonged menstrual bleeding -pain, irritation, swelling, or bruising at the insertion site -scar at site of insertion -signs of infection at the insertion site such as fever, and skin redness, pain or discharge -signs of pregnancy -signs and symptoms of a blood clot such as breathing problems; changes in vision; chest pain; severe, sudden headache; pain, swelling, warmth in the leg; trouble speaking; sudden numbness or weakness of the face, arm or leg -signs and symptoms of liver injury like dark yellow or brown urine; general ill feeling or flu-like symptoms; light-colored stools; loss of appetite; nausea; right upper belly pain; unusually weak or tired; yellowing of the eyes or skin -unusual vaginal bleeding, discharge -signs and symptoms of a stroke like changes in vision; confusion; trouble speaking or understanding; severe headaches; sudden numbness or weakness of the face, arm or leg; trouble walking; dizziness; loss of balance or coordination Side effects that usually do not require medical attention (Report  these to your doctor or health care professional if they continue or are bothersome.): -acne -back pain -breast pain -changes in weight -dizziness -general ill feeling or flu-like symptoms -headache -irregular menstrual  bleeding -nausea -sore throat -vaginal irritation or inflammation This list may not describe all possible side effects. Call your doctor for medical advice about side effects. You may report side effects to FDA at 1-800-FDA-1088. Where should I keep my medicine? This drug is given in a hospital or clinic and will not be stored at home. NOTE: This sheet is a summary. It may not cover all possible information. If you have questions about this medicine, talk to your doctor, pharmacist, or health care provider.    2016, Elsevier/Gold Standard. (2014-08-30 14:07:06) Cervical Dysplasia Cervical dysplasia is a condition in which a woman has abnormal changes in the cells of her cervix. The cervix is the opening to the uterus (womb). It is located between the vagina and the uterus. Cervical dysplasia may be the first sign of cervical cancer.  With early detection, treatment, and close follow-up care, nearly all cases of cervical dysplasia can be cured. If left untreated, dysplasia may become more severe.  CAUSES  Cervical dysplasia can be caused by a human papillomavirus (HPV) infection. RISK FACTORS   Having had a sexually transmitted disease, such as chlamydia or a human papillomavirus (HPV) infection.   Becoming sexually active before age 40.   Having had more than one sexual partner.   Not using protection during sexual intercourse, especially with new sexual partners.   Having had cancer of the vagina or vulva.   Having a sexual partner whose previous partner had cancer of the cervix or cervical dysplasia.   Having a sexual partner who has or has had cancer of the penis.   Having a weakened immune system (such as from having HIV or an organ transplant).   Being the daughter of a woman who took diethylstilbestrol(DES) during pregnancy.   Having a family history of cervical cancer.   Smoking. SIGNS AND SYMPTOMS  There are usually no symptoms. If there are symptoms, they  may include:   Abnormal vaginal discharge.   Bleeding between periods or after intercourse.   Bleeding during menopause.   Pain during sexual intercourse (dyspareunia). DIAGNOSIS  A test called a Pap test may be done.During this test, cells are taken from the cervix and then looked at under a microscope. A test in which tissue is removed from the cervix (biopsy) may also be done if the Pap test is abnormal or if the cervix looks abnormal.  TREATMENT  Treatment varies based on the severity of the cervical dysplasia. Treatment may include:  Cryotherapy. During cryotherapy, the abnormal cells are frozen with a steel-tip instrument.   A procedure to remove abnormal tissue from the cervix.  Surgery to remove abnormal tissue. This is usually done in serious cases of cervical dysplasia. Surgical options include:  A cone biopsy. This is a procedure in which the cervical canal and a portion of the center of the cervix are removed.   Hysterectomy. This is a surgery in which the uterus and cervix are removed. HOME CARE INSTRUCTIONS   Only take over-the-counter or prescription medicines for pain or discomfort as directed by your health care provider.   Do not use tampons, have sexual intercourse, or douche until your health care provider says it is okay.  Keep follow-up appointments as directed by your health care provider. Women who have been treated for cervical  dysplasia should have regular pelvic exams and Pap tests. During the first year following treatment of cervical dysplasia, Pap tests should be done every 3-4 months. In the second year, they should be done every 6 months or as recommended by your health care provider.  To prevent the condition from developing again, practice safe sex. SEEK MEDICAL CARE IF:  You develop genital warts.  SEEK IMMEDIATE MEDICAL CARE IF:   Your menstrual period is heavier than normal.   You develop bright red bleeding, especially if you have  blood clots.   You have a fever.   You have increasing cramps or pain not relieved with medicine.   You are light-headed, unusually weak, or have fainting spells.   You have abnormal vaginal discharge.   You have abdominal pain.   This information is not intended to replace advice given to you by your health care provider. Make sure you discuss any questions you have with your health care provider.   Document Released: 11/15/2005 Document Revised: 11/20/2013 Document Reviewed: 07/11/2013 Elsevier Interactive Patient Education [employer].

## 2016-05-27 ENCOUNTER — Ambulatory Visit (INDEPENDENT_AMBULATORY_CARE_PROVIDER_SITE_OTHER): Payer: BC Managed Care – PPO | Admitting: Psychology

## 2016-05-27 DIAGNOSIS — F330 Major depressive disorder, recurrent, mild: Secondary | ICD-10-CM

## 2016-05-27 NOTE — Progress Notes (Signed)
   THERAPIST PROGRESS NOTE  Session Time: 8.06am-8.34am  Participation Level: Active  Behavioral Response: Well GroomedAlertaffect blunted- pt reported fatigue  Type of Therapy: Individual Therapy  Treatment Goals addressed: Diagnosis: MDD and goal 1  Interventions: CBT and Supportive  Summary: Wendy Levy is a 20 y.o. female who presents with affect blunted.  Pt reports she is very tired.  Pt is minimally engaged. Pt reports she hasn't slept well past 2 nights waking after 3 hours and restless sleep till morning.  Pt denies any stressors initially.  Pt reports her mood is good- no conflicts.  Pt reports that she is worried about boyfriend who is homeless and over past several nights no way of getting in touch w/ him at night. Pt reports that she continues to look for a job- but not getting a job.     Suicidal/Homicidal: Nowithout intent/plan  Therapist Response: Assessed pt current functioning per pt report.  Explored w/ pt her mood, report of disruptive sleep and discussed any contributing factors.  Explored w/pt interactions w/ family and friends.  Discussed w/pt continue efforts for job and continued steps to take.   Plan: Return again in 3 weeks.  Diagnosis: MDD    Jan Fireman, [facility] 05/27/2016

## 2016-06-08 ENCOUNTER — Encounter (HOSPITAL_COMMUNITY): Payer: Self-pay | Admitting: Psychiatry

## 2016-06-08 ENCOUNTER — Ambulatory Visit (INDEPENDENT_AMBULATORY_CARE_PROVIDER_SITE_OTHER): Payer: BC Managed Care – PPO | Admitting: Psychiatry

## 2016-06-08 VITALS — BP 112/78 | HR 105 | Ht 64.0 in | Wt 269.2 lb

## 2016-06-08 DIAGNOSIS — F900 Attention-deficit hyperactivity disorder, predominantly inattentive type: Secondary | ICD-10-CM

## 2016-06-08 DIAGNOSIS — F4310 Post-traumatic stress disorder, unspecified: Secondary | ICD-10-CM | POA: Diagnosis not present

## 2016-06-08 DIAGNOSIS — G4700 Insomnia, unspecified: Secondary | ICD-10-CM | POA: Diagnosis not present

## 2016-06-08 DIAGNOSIS — F341 Dysthymic disorder: Secondary | ICD-10-CM | POA: Diagnosis not present

## 2016-06-08 MED ORDER — LISDEXAMFETAMINE DIMESYLATE 60 MG PO CAPS: 60.0000 mg | ORAL_CAPSULE | ORAL | Status: DC

## 2016-06-08 MED ORDER — LISDEXAMFETAMINE DIMESYLATE 60 MG PO CAPS
60.0000 mg | ORAL_CAPSULE | ORAL | Status: DC
Start: 2016-06-08 — End: 2016-06-24

## 2016-06-08 MED ORDER — PAROXETINE HCL 20 MG PO TABS: 20.0000 mg | ORAL_TABLET | Freq: Every day | ORAL | Status: DC

## 2016-06-08 MED ORDER — HYDROXYZINE HCL 10 MG PO TABS: ORAL_TABLET | ORAL | Status: DC
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
## 2016-06-17 ENCOUNTER — Ambulatory Visit (INDEPENDENT_AMBULATORY_CARE_PROVIDER_SITE_OTHER): Payer: BC Managed Care – PPO | Admitting: Psychology

## 2016-06-17 DIAGNOSIS — F902 Attention-deficit hyperactivity disorder, combined type: Secondary | ICD-10-CM | POA: Diagnosis not present

## 2016-06-17 DIAGNOSIS — F341 Dysthymic disorder: Secondary | ICD-10-CM

## 2016-06-17 DIAGNOSIS — F4310 Post-traumatic stress disorder, unspecified: Secondary | ICD-10-CM

## 2016-06-17 NOTE — Addendum Note (Signed)
Addended by: Nelson Chimes on: 06/17/2016 03:21 PM   Modules accepted: Level of Service

## 2016-06-17 NOTE — Progress Notes (Signed)
   THERAPIST PROGRESS NOTE  Session Time: 2.36pm-3.10pm  Participation Level: Active  Behavioral Response: Well GroomedAlertEuthymic  Type of Therapy: Individual Therapy  Treatment Goals addressed: Diagnosis: Depression, PtSD and goal 1  Interventions: CBT and Strength-based  Summary: Wendy Levy is a 20 y.o. female who presents with full and bright affect.  Pt reported that she has been doing ok.  Pt reported she tired Vistaril 2 nights and didn't sleep any better- pt didn't take another night and slept well.  Pt reports she will try again to take on a night that isn't able to sleep.  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  Pt reports encouraging boyfriend to seek care and resources to address his needs. Pt reports positive interactions w/ mom.     Suicidal/Homicidal: Nowithout intent/plan  Therapist Response: Assessed pt current functioning per pt report.  Explored w/pt her insomnia and steps taking to facilitate healthy sleep hygiene.  Discussed pt job search and encouraged pt to continue f/u on applications- make connections.    Plan: Return again in 2-3 weeks.  Diagnosis: MDD, PTSD    YATES,LEANNE, LPC 06/17/2016

## 2016-06-22 ENCOUNTER — Other Ambulatory Visit: Payer: Self-pay | Admitting: Physician Assistant

## 2016-06-24 ENCOUNTER — Encounter: Payer: Self-pay | Admitting: Family Medicine

## 2016-06-24 ENCOUNTER — Ambulatory Visit (INDEPENDENT_AMBULATORY_CARE_PROVIDER_SITE_OTHER): Payer: BC Managed Care – PPO | Admitting: Family Medicine

## 2016-06-24 VITALS — BP 122/82 | HR 97 | Temp 98.3°F | Resp 18 | Ht 64.0 in | Wt 269.0 lb

## 2016-06-24 DIAGNOSIS — D1800 Hemangioma unspecified site: Secondary | ICD-10-CM

## 2016-06-24 NOTE — Progress Notes (Signed)
By signing my name below, I, Mesha Guinyard, attest that this documentation has been prepared under the direction and in the presence of Merri Ray, MD.  Electronically Signed: Verlee Monte, Medical Scribe. 06/24/16. 1:30 PM.  Subjective:    Patient ID: Wendy Levy, female    DOB: 1996/02/24, 20 y.o.   MRN: [id number]  HPI Chief Complaint  Patient presents with   Mass    On neck x 67months     HPI Comments: Wendy Levy is a 20 y.o. female who presents to the [facility] complaining of mass on neck onset 2 months ago. Pt states it was flat before she picked at it and it has grown in size since then.  Pt popped it 2 months ago with blood draining immediately after. Pt reports episodic pain from the mass. Pt mentions she had something similar to this when she was young.  Pt denies fevers, chills, or rash.  Patient Active Problem List   Diagnosis Date Noted   PTSD (post-traumatic stress disorder) 12/30/2015   BMI 40.0-44.9, adult ([place]) 02/05/2015   Migraine, unspecified, without mention of intractable migraine without mention of status migrainosus 03/15/2013   Major depressive disorder, recurrent episode ([place]) 01/31/2012   ADHD (attention deficit hyperactivity disorder), combined type 11/02/2011   ODD (oppositional defiant disorder) 11/02/2011   Dysthymic disorder 11/02/2011   Past Medical History:  Diagnosis Date   ADHD (attention deficit hyperactivity disorder)    Allergy    Anxiety    Depression    Dysmenorrhea    Cramps periodically.    History of chlamydia 2016   Oppositional defiant disorder    Shoulder dislocation    bilateral   Wrist fracture, bilateral    Past Surgical History:  Procedure Laterality Date   WISDOM TOOTH EXTRACTION  04/2013   Allergies  Allergen Reactions   Benadryl Anti-Itch Childrens [Camphor] Rash    Rash with topical product   Prior to Admission medications   Medication Sig Start Date End Date  Taking? Authorizing Provider  cetirizine (ZYRTEC) 10 MG tablet Take 10 mg by mouth daily as needed for allergies.    Historical Provider, MD  hydrOXYzine (ATARAX/VISTARIL) 10 MG tablet Take 1-2 tabs po qHS prn insomnia 06/08/16   [name], MD  lisdexamfetamine (VYVANSE) 60 MG capsule Take 1 capsule (60 mg total) by mouth every morning. 06/08/16 09/15/16  [name], MD  LUTERA 0.1-20 MG-MCG tablet TAKE 1 TABLET BY MOUTH EVERY DAY 03/31/16   Chelle Jeffery, PA-C  Multiple Vitamin (MULTIVITAMIN) capsule Take 1 capsule by mouth daily.    Historical Provider, MD  naproxen sodium (ANAPROX DS) 550 MG tablet 1 tab po q 12 hours prn pain 05/06/16   Salvadore Dom, MD  PARoxetine (PAXIL) 20 MG tablet Take 1 tablet (20 mg total) by mouth daily. 06/08/16 06/08/17  [name], MD   Social History   Social History   Marital status: Single    Spouse name: n/a   Number of children: 0   Years of education: N/A   Occupational History   looking for work    Social History Main Topics   Smoking status: Never Smoker   Smokeless tobacco: Never Used   Alcohol use No   Drug use: No   Sexual activity: Yes    Partners: Male    Birth control/ protection: Pill   Other Topics Concern   Not on file   Social History Narrative   Lives with her mother (adopted).  Her sister lives in [place], [place] with her own son Wendy Levy, born 12/31/2014.   Graduated from [employer]. Some classes at [employer].   Review of Systems  Constitutional: Negative for chills and fever.  Musculoskeletal: Positive for neck stiffness.  Skin: Negative for rash.    Objective:  Physical Exam  Constitutional: She appears well-developed and well-nourished. No distress.  HENT:  Head: Normocephalic and atraumatic.  Eyes: Conjunctivae are normal.  Neck: Neck supple.  Cardiovascular: Normal rate.   Pulmonary/Chest: Effort normal.  Lymphadenopathy:    She has no cervical adenopathy.  Neurological: She is  alert.  Skin: Skin is warm and dry.  Left posterior neck: there is an elevated deep red lesion that measures 7 x 8 mm across. Elevated about 5 mm slightly abraded area inferior, but no active bleeding or discharge. Lesion base is approximately 7 mm across with pedunculated appearance.  No surrounding erythema from lesion on neck  Psychiatric: She has a normal mood and affect. Her behavior is normal.  Nursing note and vitals reviewed.  BP 122/82 (BP Location: Left Arm, Patient Position: Sitting, Cuff Size: Large)    Pulse 97    Temp 98.3 F (36.8 C) (Oral)    Resp 18    Ht 5\' 4"  (1.626 m)    Wt 269 lb (122 kg)    LMP 06/02/2016    SpO2 98%    BMI 46.17 kg/m   Assessment & Plan:  Wendy Levy is a 20 y.o. female Hemangioma - Plan: Ambulatory referral to Dermatology Suspected pyogenic granuloma versus hemangioma without apparent infection. We'll have evaluated by dermatology for excision due to chance for bleeding and definitive diagnosis. RTC precautions if any worsening symptoms.  No orders of the defined types were placed in this encounter.  Patient Instructions   Although the lesion looks like a hemangioma or a pyogenic granuloma as we discussed today, I would like you to meet with the dermatologist to have that lesion removed. If any increased redness, irritation, or bleeding that does not stop with pressure, return for recheck if you have not yet been seen by dermatology.      IF you received an x-ray today, you will receive an invoice from [facility]. Please contact [facility] at [phone] with questions or concerns regarding your invoice.   IF you received labwork today, you will receive an invoice from [employer]. Please contact [facility] at [phone] with questions or concerns regarding your invoice.   Our billing staff will not be able to assist you with questions regarding bills from these companies.  You will be  contacted with the lab results as soon as they are available. The fastest way to get your results is to activate your My Chart account. Instructions are located on the last page of this paperwork. If you have not heard from [place] regarding the results in 2 weeks, please contact this office.       I personally performed the services described in this documentation, which was scribed in my presence. The recorded information has been reviewed and considered, and addended by me as needed.   Signed,   Merri Ray, MD [facility].  06/24/16 2:27 PM

## 2016-06-24 NOTE — Patient Instructions (Addendum)
Although the lesion looks like a hemangioma or a pyogenic granuloma as we discussed today, I would like you to meet with the dermatologist to have that lesion removed. If any increased redness, irritation, or bleeding that does not stop with pressure, return for recheck if you have not yet been seen by dermatology.      IF you received an x-ray today, you will receive an invoice from [facility]. Please contact [facility] at [phone] with questions or concerns regarding your invoice.   IF you received labwork today, you will receive an invoice from [employer]. Please contact [facility] at [phone] with questions or concerns regarding your invoice.   Our billing staff will not be able to assist you with questions regarding bills from these companies.  You will be contacted with the lab results as soon as they are available. The fastest way to get your results is to activate your My Chart account. Instructions are located on the last page of this paperwork. If you have not heard from [place] regarding the results in 2 weeks, please contact this office.

## 2016-06-28 ENCOUNTER — Encounter: Payer: Self-pay | Admitting: Family Medicine

## 2016-07-03 ENCOUNTER — Encounter: Payer: Self-pay | Admitting: Physician Assistant

## 2016-07-09 ENCOUNTER — Ambulatory Visit (HOSPITAL_COMMUNITY): Payer: Self-pay | Admitting: Psychology

## 2016-07-26 ENCOUNTER — Ambulatory Visit (INDEPENDENT_AMBULATORY_CARE_PROVIDER_SITE_OTHER): Payer: BC Managed Care – PPO | Admitting: Psychology

## 2016-07-26 DIAGNOSIS — F341 Dysthymic disorder: Secondary | ICD-10-CM | POA: Diagnosis not present

## 2016-07-26 DIAGNOSIS — F902 Attention-deficit hyperactivity disorder, combined type: Secondary | ICD-10-CM

## 2016-07-26 NOTE — Progress Notes (Signed)
   THERAPIST PROGRESS NOTE  Session Time: 2.35pm-3.05pm  Participation Level: Active  Behavioral Response: Fairly GroomedAlertaffect wNl  Type of Therapy: Individual Therapy  Treatment Goals addressed: Diagnosis: ADHD, Dysthymia and goal 1  Interventions: CBT, Strength-based and Supportive  Summary: Wendy Levy is a 20 y.o. female who presents with affect full and bright.  Pt reports that her mood is good- no depressed mood.  Pt reports that her insomnia has been better as well- some night still difficulty falling asleep and then pt tired but overall improved.  Pt reported that her mother is out of town and has been spending time w/ boyfriend.  Pt did report that she didn't budget well and has on 17$ left for the next week.  Pt discussed items purchased that were good deals but recognized not good spending giving budget for food and needs for 2 weeks.  Pt reported she has continued to put in applications- but no leads on jobs.  Pt reports that she was offered delivery driver for food but mom wasn't in agreement w/ this. Pt reports she plans on continuing to put in applications, go to job fairs as motivated to keep car.  Pt reports mom infomed car taken in October if no job.     Suicidal/Homicidal: Nowithout intent/plan  Therapist Response: Assessed pt current functioning per pt report. Processed w/pt her mood and improvement.  Explored w/ pt budgeting on money and decisions and learning from.  Discussed w/ pt job search and her strength is her availability with schedule and no other commitments and to "sell this" strength.  Discussed presentation if face to face needs to be consistent w/ this.    Plan: Return again in 3 weeks.  Diagnosis: Dysthymic d/O an dADHD    Jan Fireman, [facility] 07/26/2016

## 2016-08-09 ENCOUNTER — Ambulatory Visit (HOSPITAL_COMMUNITY): Payer: Self-pay | Admitting: Psychology

## 2016-08-26 ENCOUNTER — Ambulatory Visit (INDEPENDENT_AMBULATORY_CARE_PROVIDER_SITE_OTHER): Payer: BC Managed Care – PPO | Admitting: Psychiatry

## 2016-08-26 ENCOUNTER — Encounter (HOSPITAL_COMMUNITY): Payer: Self-pay | Admitting: Psychiatry

## 2016-08-26 DIAGNOSIS — F341 Dysthymic disorder: Secondary | ICD-10-CM

## 2016-08-26 DIAGNOSIS — F900 Attention-deficit hyperactivity disorder, predominantly inattentive type: Secondary | ICD-10-CM | POA: Diagnosis not present

## 2016-08-26 DIAGNOSIS — G4700 Insomnia, unspecified: Secondary | ICD-10-CM | POA: Diagnosis not present

## 2016-08-26 DIAGNOSIS — F4310 Post-traumatic stress disorder, unspecified: Secondary | ICD-10-CM

## 2016-08-26 MED ORDER — HYDROXYZINE HCL 10 MG PO TABS: ORAL_TABLET | ORAL | 2 refills | Status: DC

## 2016-08-26 MED ORDER — LISDEXAMFETAMINE DIMESYLATE 60 MG PO CAPS: 60.0000 mg | ORAL_CAPSULE | ORAL | 0 refills | Status: DC

## 2016-08-26 MED ORDER — PAROXETINE HCL 20 MG PO TABS: 20.0000 mg | ORAL_TABLET | Freq: Every day | ORAL | 3 refills | Status: DC
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
## 2016-08-30 ENCOUNTER — Ambulatory Visit (INDEPENDENT_AMBULATORY_CARE_PROVIDER_SITE_OTHER): Payer: BC Managed Care – PPO | Admitting: Psychology

## 2016-08-30 DIAGNOSIS — F341 Dysthymic disorder: Secondary | ICD-10-CM

## 2016-08-30 NOTE — Progress Notes (Signed)
   THERAPIST PROGRESS NOTE  Session Time: 2.35pm-3.04pm  Participation Level: Active  Behavioral Response: Well GroomedAlert, tired  Type of Therapy: Individual Therapy  Treatment Goals addressed: Diagnosis: Depression and goal 1.  Interventions: CBT and Supportive  Summary: Wendy Levy is a 20 y.o. female who presents with report of feeling really tired.  Pt reports she is sleeping but doesn't feel rested.  Pt reports that she completed the week class w/ [employer] and did gain from improving resume.  Pt reported that her job coach has not followed up and not sure if she should f/u. Pt gained awareness that she can initiate contact w/ organization. 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 Pt increased awareness that continuing volunteer experience could benefit.     Suicidal/Homicidal: Nowithout intent/plan  Therapist Response: Assessed pt current functioning per pt report.  Explored w/ pt her progress towards her personal goals and stressors she feels w/out getting employment.  Discussed her invovelment w/ [employer] work program and steps she can initiate further support from this program.  Assisted pt w/ identifying strengths and barriers to employment and ways she can reduce barriers w/ volunteer experience.    Plan: Return again in 3 weeks.  Diagnosis: Dysthymic d/O    Wendy Levy, [place] 08/30/2016

## 2016-09-09 ENCOUNTER — Encounter: Payer: Self-pay | Admitting: Physician Assistant

## 2016-09-19 ENCOUNTER — Other Ambulatory Visit (HOSPITAL_COMMUNITY): Payer: Self-pay | Admitting: Psychiatry

## 2016-09-19 DIAGNOSIS — F341 Dysthymic disorder: Secondary | ICD-10-CM

## 2016-09-19 DIAGNOSIS — F4310 Post-traumatic stress disorder, unspecified: Secondary | ICD-10-CM

## 2016-09-21 ENCOUNTER — Ambulatory Visit (INDEPENDENT_AMBULATORY_CARE_PROVIDER_SITE_OTHER): Payer: BC Managed Care – PPO | Admitting: Psychology

## 2016-09-21 ENCOUNTER — Encounter (HOSPITAL_COMMUNITY): Payer: Self-pay | Admitting: Psychology

## 2016-09-21 DIAGNOSIS — F4310 Post-traumatic stress disorder, unspecified: Secondary | ICD-10-CM | POA: Diagnosis not present

## 2016-09-21 DIAGNOSIS — F341 Dysthymic disorder: Secondary | ICD-10-CM | POA: Diagnosis not present

## 2016-09-21 DIAGNOSIS — F902 Attention-deficit hyperactivity disorder, combined type: Secondary | ICD-10-CM | POA: Diagnosis not present

## 2016-09-21 NOTE — Progress Notes (Signed)
   THERAPIST PROGRESS NOTE  Session Time: 11.15am-11.50am  Participation Level: Active  Behavioral Response: Fairly GroomedAlertaffect wnl  Type of Therapy: Individual Therapy  Treatment Goals addressed: Diagnosis: Dysthymic D/O, PTSD and ADHD and goal 1.  Interventions: Motivational Interviewing and Solution Focused  Summary: Wendy Levy is a 20 y.o. female who presents with affect WNL. Pt called when realized missed appointment and was able to be worked in. Pt reported that a lot has happened since last visit.  Pt reported that she didn't get a job by deadline mom gave to keep car, mom and her had a talk and decided she would leave the house and she could keep the car and phone for now.  Pt reported that she is homeless and living w/ her boyfriend in a tent.  Pt reported that her sister is upset w/ her and feels she made the wrong choice.  Pt reported that she feels she made the right choice because doesn't want to live by mom's limits and feels she will learn better on her own. Pt reports she has continued to look for a job and is now considering jobs hadn't before- like [employer]. 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  Pt reports that she is liking being free of the house rules- but reports not getting complacent and feels work would give her something to focus on as well as income. Pt reports she is going to register w/ [employer].    Suicidal/Homicidal: Nowithout intent/plan  Therapist Response: Assessed pt current functioning per pt report.  Explored w/pt her decision to move out and what impacted.  Discussed other's perspectives and their concerns that would give up housing.  Processed w/pt her motivation for getting a job vs. Becoming complacent w/ current routine.  Had pt identify next steps and acknowledge doesn't have to get her preferred job.    Plan: Return again in 2 weeks.  Diagnosis: Dysthymia, ADHd and PTSD    YATES,LEANNE,  [place] 09/21/2016

## 2016-09-21 NOTE — Progress Notes (Unsigned)
Wendy Levy is a 20 y.o. female patient who didn't show for appointment.  Letter sent.        Jan Fireman, LPC

## 2016-10-05 ENCOUNTER — Ambulatory Visit (INDEPENDENT_AMBULATORY_CARE_PROVIDER_SITE_OTHER): Payer: BC Managed Care – PPO | Admitting: Psychology

## 2016-10-05 DIAGNOSIS — F341 Dysthymic disorder: Secondary | ICD-10-CM | POA: Diagnosis not present

## 2016-10-05 DIAGNOSIS — F902 Attention-deficit hyperactivity disorder, combined type: Secondary | ICD-10-CM

## 2016-10-05 NOTE — Progress Notes (Signed)
   THERAPIST PROGRESS NOTE  Session Time: 2.17pm-2.46pm  Participation Level: Active  Behavioral Response: Fairly GroomedAlertaffect wnl  Type of Therapy: Individual Therapy  Treatment Goals addressed: Diagnosis: depression, adhd and goal 1.  Interventions: CBT and Supportive  Summary: Liset Mcaleese is a 20 y.o. female who presents with affect wnl.  Pt reported that she has been busy putting in applications today.  Pt reported that she and mom are communicating well- "mom's still pushing me" about employment.  Pt reported that she is looking forward to staying at mom's next 2 nights as came to agreement to do so.  Pt discussed some disagreements w/ boyfriend re: use of money and priorities and concerned that he really isn't trying to look for work. 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  Pt aware mom won't support.     Suicidal/Homicidal: Nowithout intent/plan  Therapist Response: Assessed pt current functioning per pt report. Processed w/pt steps towards job search and other efforts can make.  Explored w/pt conflict w/ boyfriend and identifying her priorities and values- not changing for relationship.  Encouraged continued communication w/ mom and supports.   Plan: Return again in 2 weeks.  Diagnosis: Dysthymia and ADHD    Malekai Markwood, [place] 10/05/2016

## 2016-10-19 ENCOUNTER — Ambulatory Visit (INDEPENDENT_AMBULATORY_CARE_PROVIDER_SITE_OTHER): Payer: BC Managed Care – PPO | Admitting: Psychology

## 2016-10-19 DIAGNOSIS — F341 Dysthymic disorder: Secondary | ICD-10-CM
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
## 2016-11-02 ENCOUNTER — Ambulatory Visit (INDEPENDENT_AMBULATORY_CARE_PROVIDER_SITE_OTHER): Payer: BC Managed Care – PPO | Admitting: Psychology

## 2016-11-02 DIAGNOSIS — F341 Dysthymic disorder: Secondary | ICD-10-CM

## 2016-11-02 NOTE — Progress Notes (Signed)
   THERAPIST PROGRESS NOTE  Session Time: 2.30pm-300pm  Participation Level: Active  Behavioral Response: Well GroomedAlerttired  Type of Therapy: Individual Therapy  Treatment Goals addressed: Diagnosis: Dysthymia and goal 1.  Interventions: CBT, Strength-based and Supportive  Summary: Wendy Levy is a 20 y.o. female who presents with report of feeling really tired.  Pt reports sht has been feeling sore throat, congested and beginning to cough.  Pt reports that she is employed and feels good about this. 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  Pt reports she starts tomorrow training for [employer] that is opening and will work evening hours. Pt reports motivated to get mom paid back for schooling. Pt reported that had good Thanksgiving and positive interactions w/ her family and boyfriends family.  Pt reports she is staying at [employer].    Suicidal/Homicidal: Nowithout intent/plan  Therapist Response: Assessed pt current functioning per pt report.  Explored w/ pt progress w/ job and allowing for adjustment to new jobs, Immunologist the jobs and expectations.  Explored w/ pt interactions w/ family and boyfriend.   Plan: Return again in 3 weeks.  Diagnosis: Dysthymic D/O     Jan Fireman, [facility] 11/02/2016

## 2016-11-12 ENCOUNTER — Ambulatory Visit (INDEPENDENT_AMBULATORY_CARE_PROVIDER_SITE_OTHER): Payer: BC Managed Care – PPO | Admitting: Physician Assistant

## 2016-11-12 VITALS — BP 132/90 | HR 106 | Temp 98.4°F | Ht 64.0 in | Wt 273.8 lb

## 2016-11-12 DIAGNOSIS — M545 Low back pain, unspecified: Secondary | ICD-10-CM

## 2016-11-12 DIAGNOSIS — M79672 Pain in left foot: Secondary | ICD-10-CM

## 2016-11-12 DIAGNOSIS — M79671 Pain in right foot: Secondary | ICD-10-CM | POA: Diagnosis not present

## 2016-11-12 MED ORDER — NAPROXEN SODIUM 550 MG PO TABS: ORAL_TABLET | ORAL | 2 refills | Status: DC

## 2016-11-12 NOTE — Patient Instructions (Addendum)
Try a heating pad on your back, and ICE massage on your feet.  Start working on core muscle strengthening (especially in the abdomen), which will help to reduce your back pain.    IF you received an x-ray today, you will receive an invoice from [facility]. Please contact [facility] at [phone] with questions or concerns regarding your invoice.   IF you received labwork today, you will receive an invoice from [facility]. Please contact [facility] at [phone] with questions or concerns regarding your invoice.   Our billing staff will not be able to assist you with questions regarding bills from these companies.  You will be contacted with the lab results as soon as they are available. The fastest way to get your results is to activate your My Chart account. Instructions are located on the last page of this paperwork. If you have not heard from [place] regarding the results in 2 weeks, please contact this office.     Back Exercises Introduction If you have pain in your back, do these exercises 2-3 times each day or as told by your doctor. When the pain goes away, do the exercises once each day, but repeat the steps more times for each exercise (do more repetitions). If you do not have pain in your back, do these exercises once each day or as told by your doctor. Exercises Single Knee to Chest  Do these steps 3-5 times in a row for each leg: 1. Lie on your back on a firm bed or the floor with your legs stretched out. 2. Bring one knee to your chest. 3. Hold your knee to your chest by grabbing your knee or thigh. 4. Pull on your knee until you feel a gentle stretch in your lower back. 5. Keep doing the stretch for 10-30 seconds. 6. Slowly let go of your leg and straighten it. Pelvic Tilt  Do these steps 5-10 times in a row: 1. Lie on your back on a firm bed or the floor with your legs stretched out. 2. Bend your knees so they point up to the ceiling. Your feet should be  flat on the floor. 3. Tighten your lower belly (abdomen) muscles to press your lower back against the floor. This will make your tailbone point up to the ceiling instead of pointing down to your feet or the floor. 4. Stay in this position for 5-10 seconds while you gently tighten your muscles and breathe evenly. Cat-Cow  Do these steps until your lower back bends more easily: 1. Get on your hands and knees on a firm surface. Keep your hands under your shoulders, and keep your knees under your hips. You may put padding under your knees. 2. Let your head hang down, and make your tailbone point down to the floor so your lower back is round like the back of a cat. 3. Stay in this position for 5 seconds. 4. Slowly lift your head and make your tailbone point up to the ceiling so your back hangs low (sags) like the back of a cow. 5. Stay in this position for 5 seconds. Press-Ups  Do these steps 5-10 times in a row: 1. Lie on your belly (face-down) on the floor. 2. Place your hands near your head, about shoulder-width apart. 3. While you keep your back relaxed and keep your hips on the floor, slowly straighten your arms to raise the top half of your body and lift your shoulders. Do not use your back muscles. To make  yourself more comfortable, you may change where you place your hands. 4. Stay in this position for 5 seconds. 5. Slowly return to lying flat on the floor. Bridges  Do these steps 10 times in a row: 1. Lie on your back on a firm surface. 2. Bend your knees so they point up to the ceiling. Your feet should be flat on the floor. 3. Tighten your butt muscles and lift your butt off of the floor until your waist is almost as high as your knees. If you do not feel the muscles working in your butt and the back of your thighs, slide your feet 1-2 inches farther away from your butt. 4. Stay in this position for 3-5 seconds. 5. Slowly lower your butt to the floor, and let your butt muscles relax. If  this exercise is too easy, try doing it with your arms crossed over your chest. Belly Crunches  Do these steps 5-10 times in a row: 1. Lie on your back on a firm bed or the floor with your legs stretched out. 2. Bend your knees so they point up to the ceiling. Your feet should be flat on the floor. 3. Cross your arms over your chest. 4. Tip your chin a little bit toward your chest but do not bend your neck. 5. Tighten your belly muscles and slowly raise your chest just enough to lift your shoulder blades a tiny bit off of the floor. 6. Slowly lower your chest and your head to the floor. Back Lifts  Do these steps 5-10 times in a row: 1. Lie on your belly (face-down) with your arms at your sides, and rest your forehead on the floor. 2. Tighten the muscles in your legs and your butt. 3. Slowly lift your chest off of the floor while you keep your hips on the floor. Keep the back of your head in line with the curve in your back. Look at the floor while you do this. 4. Stay in this position for 3-5 seconds. 5. Slowly lower your chest and your face to the floor. Contact a doctor if:  Your back pain gets a lot worse when you do an exercise.  Your back pain does not lessen 2 hours after you exercise. If you have any of these problems, stop doing the exercises. Do not do them again unless your doctor says it is okay. Get help right away if:  You have sudden, very bad back pain. If this happens, stop doing the exercises. Do not do them again unless your doctor says it is okay. This information is not intended to replace advice given to you by your health care provider. Make sure you discuss any questions you have with your health care provider. Document Released: 12/18/2010 Document Revised: 04/22/2016 Document Reviewed: 01/09/2015  2017 Elsevier

## 2016-11-12 NOTE — Progress Notes (Signed)
Patient ID: Wendy Levy, female    DOB: 1996-04-09, 20 y.o.   MRN: [id number]  PCP: Harrison Mons, PA-C  Chief Complaint  Patient presents with  . Back Pain    X 1day lower back pain  . Foot pain    X 1 mth    Subjective:   Presents for evaluation of low back and bilateral foot pain.  Back pain began yesterday. No trauma or injury recalled. No urinary symptoms. No radicular pain. No paresthesias. No LE weakness. No saddle anesthesia.  Foot pain x 1 month.. Walking or standing for long periods, pain starts on the outer feet and moves to the heels and then ankle. Rest helps. Doesn't take anything for pain. Boyfriend massages her feet, which helps.  New job Radiographer, therapeutic at [employer].  Notes that she is having difficulty sleeping. Sometimes falls asleep and then wakes up. She relates that her psychiatrist said to ask her doctor about this. Last psychiatry note 07/2016 states that the patient was sleeping well most nights, rarely needing the hydroxyzine.  Last note with her therapist 11/02/2016 is marked such that I do not have access to review it.   Review of Systems As above.    Patient Active Problem List   Diagnosis Date Noted  . PTSD (post-traumatic stress disorder) 12/30/2015  . BMI 40.0-44.9, adult ([place]) 02/05/2015  . Migraine, unspecified, without mention of intractable migraine without mention of status migrainosus 03/15/2013  . Major depressive disorder, recurrent episode ([place]) 01/31/2012  . ADHD (attention deficit hyperactivity disorder), combined type 11/02/2011  . ODD (oppositional defiant disorder) 11/02/2011  . Dysthymic disorder 11/02/2011     Prior to Admission medications   Medication Sig Start Date End Date Taking? Authorizing Provider  cetirizine (ZYRTEC) 10 MG tablet Take 10 mg by mouth daily as needed for allergies.   Yes Historical Provider, MD  hydrOXYzine (ATARAX/VISTARIL) 10 MG tablet Take 1-2 tabs po qHS prn insomnia  08/26/16  Yes [name], MD  lisdexamfetamine (VYVANSE) 60 MG capsule Take 1 capsule (60 mg total) by mouth every morning. 08/26/16 12/03/16 Yes [name], MD  LUTERA 0.1-20 MG-MCG tablet TAKE 1 TABLET BY MOUTH EVERY DAY 03/31/16  Yes Kalenna Millett, PA-C  Multiple Vitamin (MULTIVITAMIN) capsule Take 1 capsule by mouth daily.   Yes Historical Provider, MD  naproxen sodium (ANAPROX DS) 550 MG tablet 1 tab po q 12 hours prn pain 05/06/16  Yes Salvadore Dom, MD  PARoxetine (PAXIL) 20 MG tablet Take 1 tablet (20 mg total) by mouth daily. 08/26/16 08/26/17 Yes [name], MD  lisdexamfetamine (VYVANSE) 60 MG capsule Take 1 capsule (60 mg total) by mouth every morning. Patient not taking: Reported on 11/12/2016 08/26/16   [name], MD  lisdexamfetamine (VYVANSE) 60 MG capsule Take 1 capsule (60 mg total) by mouth every morning. Patient not taking: Reported on 11/12/2016 08/26/16   [name], MD     Allergies  Allergen Reactions  . Benadryl Anti-Itch Childrens [Camphor] Rash    Rash with topical product       Objective:  Physical Exam  Constitutional: She is oriented to person, place, and time. She appears well-developed and well-nourished. She is active and cooperative. No distress.  BP 132/90 (BP Location: Right Arm, Patient Position: Sitting, Cuff Size: Large)   Pulse (!) 106   Temp 98.4 F (36.9 C) (Oral)   Ht 5\' 4"  (1.626 m)   Wt 273 lb 12.8 oz (124.2 kg)   LMP 10/06/2016 (Approximate)  SpO2 98%   BMI 47.00 kg/m   HENT:  Head: Normocephalic and atraumatic.  Right Ear: Hearing normal.  Left Ear: Hearing normal.  Eyes: Conjunctivae are normal. No scleral icterus.  Neck: Normal range of motion. Neck supple. No thyromegaly present.  Cardiovascular: Normal rate, regular rhythm and normal heart sounds.   Pulses:      Radial pulses are 2+ on the right side, and 2+ on the left side.  Pulmonary/Chest: Effort normal and breath sounds normal.  Musculoskeletal:        Right ankle: Normal. Achilles tendon normal.       Left ankle: Normal. Achilles tendon normal.       Cervical back: Normal.       Thoracic back: Normal.       Lumbar back: She exhibits tenderness and pain. She exhibits normal range of motion, no bony tenderness, no swelling, no edema, no deformity, no laceration and no spasm.       Right lower leg: Normal.       Left lower leg: Normal.       Right foot: Normal.       Left foot: Normal.  Lymphadenopathy:       Head (right side): No tonsillar, no preauricular, no posterior auricular and no occipital adenopathy present.       Head (left side): No tonsillar, no preauricular, no posterior auricular and no occipital adenopathy present.    She has no cervical adenopathy.       Right: No supraclavicular adenopathy present.       Left: No supraclavicular adenopathy present.  Neurological: She is alert and oriented to person, place, and time. No sensory deficit.  Skin: Skin is warm, dry and intact. No rash noted. No cyanosis or erythema. Nails show no clubbing.  Psychiatric: She has a normal mood and affect. Her speech is normal and behavior is normal.           Assessment & Plan:   1. Foot pain, bilateral 2. Acute bilateral low back pain without sciatica Recommend supportive shoes, back stretching and strengthening and core strengthening. NSAIDS. Ice massage to feet PRN. If symptoms worsen/persist. - naproxen sodium (ANAPROX DS) 550 MG tablet; 1 tab po q 12 hours prn pain  Dispense: 30 tablet; Refill: 2 - Care order/instruction:  She mentioned the sleep issue as she was leaving. She has hydroxyzine at home, but has not been taking it. Encouraged her to use it as needed at [facility], or if she wakes up during the night and has at least 6 hours to sleep. RTC if symptoms persist with treatment.  Fara Chute, PA-C Physician Assistant-Certified [facility]

## 2016-11-25 ENCOUNTER — Encounter (HOSPITAL_COMMUNITY): Payer: Self-pay | Admitting: Psychiatry

## 2016-11-25 ENCOUNTER — Ambulatory Visit (INDEPENDENT_AMBULATORY_CARE_PROVIDER_SITE_OTHER): Payer: BC Managed Care – PPO | Admitting: Psychiatry

## 2016-11-25 DIAGNOSIS — F341 Dysthymic disorder: Secondary | ICD-10-CM | POA: Diagnosis not present

## 2016-11-25 DIAGNOSIS — F4310 Post-traumatic stress disorder, unspecified: Secondary | ICD-10-CM | POA: Diagnosis not present

## 2016-11-25 DIAGNOSIS — F5105 Insomnia due to other mental disorder: Secondary | ICD-10-CM

## 2016-11-25 DIAGNOSIS — F900 Attention-deficit hyperactivity disorder, predominantly inattentive type: Secondary | ICD-10-CM

## 2016-11-25 DIAGNOSIS — Z79899 Other long term (current) drug therapy: Secondary | ICD-10-CM

## 2016-11-25 DIAGNOSIS — F99 Mental disorder, not otherwise specified: Secondary | ICD-10-CM

## 2016-11-25 MED ORDER — HYDROXYZINE HCL 10 MG PO TABS: ORAL_TABLET | ORAL | 3 refills | Status: DC

## 2016-11-25 MED ORDER — LISDEXAMFETAMINE DIMESYLATE 60 MG PO CAPS: 60.0000 mg | ORAL_CAPSULE | ORAL | 0 refills | Status: DC

## 2016-11-25 MED ORDER — PAROXETINE HCL 20 MG PO TABS: 20.0000 mg | ORAL_TABLET | Freq: Every day | ORAL | 3 refills | Status: DC

## 2016-11-25 NOTE — Progress Notes (Signed)
Patient ID: Wendy Levy, female   DOB: Jan 09, 1996, 20 y.o.   MRN: [id number] [place] [id number] Progress Note  Wendy Levy [id number] 20 y.o.   Chief Complaint: "alright"  History of Present Illness: reviewed information with patient on 11/25/16  and same as previous visits except as noted  Patient is a 20 year old diagnosed with ADHD combined type, Dysthmia and ODD who presents today for a followup visit.  Pt is working at [employer] for the last one month. She got "bored of being bored".   Pt states Vyvanse is working at 60mg  and she is taking it daily. Pt takes it in the morning at 10:30am and it wears off at 5-6pm. At that point her appetite increases and she eats more.  Denies GI upset, insomnia and mood changes. Reports mild appetite suppression and she is no longer snacking or eating huge portions.  Pt remains irritable and states "that is never going to change". Pt states she rarely feels sad about 1-2x/week.  Denies anhedonia, isolation, crying spells, low motivation, poor hygiene, worthlessness and hopelessness. Denies SI/HI.  Sleep, appetite and energy are good.    Anxiety is improving. She is no longer scratching herself when anxious.   Denies AVH. Denies grandious thoughts, compulsive or new/change in substance use.   Pt taking meds as prescribed. Denies SE from meds.    Suicidal Ideation: No Plan Formed: No Patient has means to carry out plan: No  Homicidal Ideation: No Plan Formed: No Patient has means to carry out plan: No    Review of Systems  Skin: Negative for color change, dry skin, itching, poor wound healing and rash.  Musculoskeletal: Positive for back pain and joint pain. Negative for falls, myalgias, neck pain and stiffness.  Gastrointestinal: Negative for bloating, abdominal pain, constipation, diarrhea, nausea and vomiting.  Neurological: Positive for difficulty with concentration. Negative for dizziness, headaches, numbness, seizures  and tremors.  Psychiatric/Behavioral: Negative for altered mental status, depression, hallucinations, hypervigilance, memory loss, substance abuse, suicidal ideas and thoughts of violence. The patient is nervous/anxious. The patient does not have insomnia.       Past Medical, Family, Social History: reviewed information with patient on 11/25/16  and same as previous visits except as noted  Patient lives with her adoptive mom.  Working at [employer].   reports that she has never smoked. She has never used smokeless tobacco. She reports that she does not drink alcohol or use drugs.  Family History  Problem Relation Age of Onset  . Adopted: Yes  . Depression Sister     reportedly in and out of treatment facilities, also dx w/ RAD  . Suicidality Sister     Past Medical History:  Diagnosis Date  . ADHD (attention deficit hyperactivity disorder)   . Allergy   . Anxiety   . Depression   . Dysmenorrhea    Cramps periodically.   [employer] History of chlamydia 2016  . Oppositional defiant disorder   . Shoulder dislocation    bilateral  . Wrist fracture, bilateral     Outpatient Encounter Prescriptions as of 11/25/2016  Medication Sig Dispense Refill  . cetirizine (ZYRTEC) 10 MG tablet Take 10 mg by mouth daily as needed for allergies.    . hydrOXYzine (ATARAX/VISTARIL) 10 MG tablet Take 1-2 tabs po qHS prn insomnia 60 tablet 2  . lisdexamfetamine (VYVANSE) 60 MG capsule Take 1 capsule (60 mg total) by mouth every morning. 30 capsule 0  . lisdexamfetamine (VYVANSE) 60 MG capsule Take  1 capsule (60 mg total) by mouth every morning. 30 capsule 0  . lisdexamfetamine (VYVANSE) 60 MG capsule Take 1 capsule (60 mg total) by mouth every morning. 30 capsule 0  . LUTERA 0.1-20 MG-MCG tablet TAKE 1 TABLET BY MOUTH EVERY DAY 84 tablet 0  . Multiple Vitamin (MULTIVITAMIN) capsule Take 1 capsule by mouth daily.    . naproxen sodium (ANAPROX DS) 550 MG tablet 1 tab po q 12 hours prn pain 30 tablet 2  .  PARoxetine (PAXIL) 20 MG tablet Take 1 tablet (20 mg total) by mouth daily. 30 tablet 3   No facility-administered encounter medications on file as of 11/25/2016.     Past Psychiatric History/Hospitalization(s): Anxiety: No Bipolar Disorder: No Depression: Yes Mania: No Psychosis: No Schizophrenia: No Personality Disorder: No Hospitalization for psychiatric illness: No History of Electroconvulsive Shock Therapy: No Prior Suicide Attempts: No  Physical Exam: Constitutional:  BP 114/68 (BP Location: Left Arm, Patient Position: Sitting, Cuff Size: Large)   Pulse 99   Ht 5\' 4"  (1.626 m)   Wt 276 lb (125.2 kg)   LMP 11/19/2016   BMI 47.38 kg/m   General Appearance: alert, oriented, no acute distress and obese  Musculoskeletal: Strength & Muscle Tone: within normal limits Gait & Station: normal Patient leans: straight   Mental Status Examination/Evaluation: reviewed MSE on 11/25/16  and same as previous visits except as noted  Objective: Attitude: Calm and cooperative  Appearance: Casual, appears to be stated age  Eye Contact::  Fair  Speech:  Clear and Coherent, Normal Rate and spontaneous  Volume:  Normal  Mood:  euthymic  Affect:  Constricted  Thought Process:  concrete  Orientation:  Full (Time, Place, and Person)  Thought Content:  Negative  Suicidal Thoughts:  No  Homicidal Thoughts:  No  Judgement:  Fair  Insight:  Fair  Concentration: good  Memory: Immediate-intact Recent-intact Remote-intact  Recall: fair  Language: fair  Gait and Station: normal  [employer] of Knowledge: average  Psychomotor Activity:  Normal  Akathisia:  No  Handed:  Right  AIMS (if indicated): n/a  Assets:  Communication Skills Desire for Improvement Financial Resources/Insurance Housing     reviewed A&P on 11/25/16  and same as previous visits except as noted  Assessment:  ADHD combined type, moderate severity; oppositional defiant disorder; dysthymic disorder; PTSD,  Insomnia   Plan: ADHD combined type: Vyvanse 60 mg one in the morning for ADHD inattentive type.Marland Kitchen Dysthymic disorder and PTSD: Paxil 20mg  one in the morning for dysthymia Oppositional defiant disorder: well controlled Insomnia: Vistaril 10-20mg  po qHS prn insomnia   Medication management with supportive therapy. Risks/benefits and SE of the medication discussed. Pt verbalized understanding and verbal consent obtained for treatment.  Affirm with the patient that the medications are taken as ordered. Patient expressed understanding of how their medications were to be used.    Labs: 04/28/2016 ALT 35 otherwise CMP WNL, Chol 209, triglycerides 315, Hb/Hct WNL, TSH WNL  Therapy: brief supportive therapy provided. Discussed psychosocial stressors in detail.   encouraged to continue individual counseling with LeAnn  Pt denies SI and is at an acute low risk for suicide.Patient told to call clinic if any problems occur. Patient advised to go to ER if they should develop SI/HI, side effects, or if symptoms worsen. Has crisis numbers to call if needed. Pt verbalized understanding.  F/up in 3 months or sooner if needed  [name], MD

## 2016-12-02 ENCOUNTER — Ambulatory Visit (INDEPENDENT_AMBULATORY_CARE_PROVIDER_SITE_OTHER): Payer: BC Managed Care – PPO | Admitting: Psychology

## 2016-12-02 DIAGNOSIS — F341 Dysthymic disorder: Secondary | ICD-10-CM

## 2016-12-02 NOTE — Progress Notes (Signed)
   THERAPIST PROGRESS NOTE  Session Time: 1.30pm-2.10pm  Participation Level: Active  Behavioral Response: Well GroomedAlertaffect bright  Type of Therapy: Individual Therapy  Treatment Goals addressed: Diagnosis: Dysthymia and goal 1.  Interventions: CBT and Supportive  Summary: Wendy Levy is a 21 y.o. female who presents with full and bright affect.  Pt reported that she is enjoying working. Has been working at [employer] for over a month- daily for lunch.  Pt reports that her boss did call her off today as slow.  Pt states that her mood is good and she is feeling purposeful w/ working. Pt reported her holidays were good and excited about celebrating 1 year w/ boyfriend in couple of weeks. Pt reported that she still feels very tired every day and easily exhausted.  Pt is considering sleep study.  Pt reports that she does need to work on asserting herself and better communication w/ mom as well.  Pt reports that she feels used by sister for a ride and doesn't want to take her but struggles to tell her no.  Pt discussed what she could say to sister to set some boundaries.     Suicidal/Homicidal: Nowithout intent/plan  Therapist Response: Assessed pt current functioning per pt report. Processed w/pt her mood and sleep.  Encouraged to f/u w/ her PCP is stills struggling w/ a lot of fatigue as pt reports mood improved and sleeping at night.  Discussed w/pt adjustment to job and enjoyment getting from job.  Explored w/pt her wants as continues to work in counseling and setting goals for herself.   Plan: Return again in 2 weeks.  Diagnosis: Dysthymia   Norvin Ohlin, LPC 12/02/2016

## 2016-12-16 ENCOUNTER — Ambulatory Visit (HOSPITAL_COMMUNITY): Payer: Self-pay | Admitting: Psychology

## 2016-12-30 ENCOUNTER — Ambulatory Visit (INDEPENDENT_AMBULATORY_CARE_PROVIDER_SITE_OTHER): Payer: BC Managed Care – PPO | Admitting: Psychology

## 2016-12-30 ENCOUNTER — Encounter (HOSPITAL_COMMUNITY): Payer: Self-pay | Admitting: Psychology

## 2016-12-30 DIAGNOSIS — F902 Attention-deficit hyperactivity disorder, combined type: Secondary | ICD-10-CM | POA: Diagnosis not present

## 2016-12-30 DIAGNOSIS — F341 Dysthymic disorder: Secondary | ICD-10-CM | POA: Diagnosis not present

## 2016-12-30 NOTE — Progress Notes (Signed)
   THERAPIST PROGRESS NOTE  Session Time: 3.30pm-4pm  Participation Level: Active  Behavioral Response: Fairly GroomedAlertaffect wnl  Type of Therapy: Individual Therapy  Treatment Goals addressed: Diagnosis: dysthymia and goal 1.  Interventions: Assertiveness Training  Summary: Wendy Levy is a 21 y.o. female who presents with affect WNL.  Pt reported that she has just been working recently.  Pt reported that she is enjoying work, paying mom back and saving up money.  Pt reported mood has been mellow recently and sleeping ok.  Pt does report restless legs in bed and feeling aching from foot up calf during day.  Pt agrees to f/u w/ PCP if continues.  Pt reported that she was able to assert w/ sister not being her ride all the time and that this went well- sister accepted and wasn't upset.  Pt also discussed how she is continuing boundaries w/ her exfriend who continues to attempt contact.    Suicidal/Homicidal: Nowithout intent/plan  Therapist Response: Assessed pt current functioning per pt report.  Processed w/ pt asserting herself w/ sister and outcome of.  Reflected positive outcome and ability assert in other situations as well.   Plan: Return again in 3 weeks.  Diagnosis: Dythymic D/O and ADHD   YATES,LEANNE, [place] 12/30/2016

## 2017-01-17 ENCOUNTER — Ambulatory Visit (HOSPITAL_COMMUNITY): Payer: Self-pay | Admitting: Psychology

## 2017-01-19 ENCOUNTER — Other Ambulatory Visit (HOSPITAL_COMMUNITY): Payer: Self-pay | Admitting: Psychiatry

## 2017-01-19 DIAGNOSIS — F341 Dysthymic disorder: Secondary | ICD-10-CM

## 2017-01-19 DIAGNOSIS — F4310 Post-traumatic stress disorder, unspecified: Secondary | ICD-10-CM

## 2017-02-07 ENCOUNTER — Ambulatory Visit (HOSPITAL_COMMUNITY): Payer: Self-pay | Admitting: Psychology

## 2017-02-08 ENCOUNTER — Ambulatory Visit (INDEPENDENT_AMBULATORY_CARE_PROVIDER_SITE_OTHER): Payer: BC Managed Care – PPO | Admitting: Psychology

## 2017-02-08 DIAGNOSIS — F341 Dysthymic disorder: Secondary | ICD-10-CM

## 2017-02-08 DIAGNOSIS — F902 Attention-deficit hyperactivity disorder, combined type: Secondary | ICD-10-CM

## 2017-02-08 NOTE — Progress Notes (Signed)
   THERAPIST PROGRESS NOTE  Session Time: 1.20pm-1.52pm  Participation Level: Active  Behavioral Response: Well GroomedAlertaffect wnl  Type of Therapy: Individual Therapy  Treatment Goals addressed: Diagnosis: Dysthymia and goal 1.  Interventions: CBT and Supportive  Summary: Wendy Levy is a 21 y.o. female who presents with report of increased sleep past 2 days.  Pt reported that she has started back on Vistaril and this is helping her to sleep through the night.  Pt reported 2 days ago and yesterday slept more than normal. Pt is able to identify that working increased hours the day before may had impacted.  Pt reported today she woke normal and was able to do work.  Pt reports that she is enjoying work and is paying mom money owed.  Pt reported positive interactions w/ mom.  Pt reported that she has been dealing w/ some medical issues- some nausea, some skin irritation and heel hurting and agrees to f/u w/ PCP re:Marland Kitchen    Suicidal/Homicidal: Nowithout intent/plan  Therapist Response: Assessed pt current functioning per pt report.  Explored w/ pt her increased sleep past 2 days and discussed sleep hygiene and routine.  Explored w/pt her stressors and encouraged self care f/u/.  Plan: Return again in 2 weeks.  Diagnosis: Dysthymia and ADHD    Xitlaly Ault, [facility] 02/08/2017

## 2017-02-15 ENCOUNTER — Other Ambulatory Visit (HOSPITAL_COMMUNITY): Payer: Self-pay | Admitting: Psychiatry

## 2017-02-15 DIAGNOSIS — F4310 Post-traumatic stress disorder, unspecified: Secondary | ICD-10-CM

## 2017-02-15 DIAGNOSIS — F341 Dysthymic disorder: Secondary | ICD-10-CM

## 2017-02-23 ENCOUNTER — Ambulatory Visit (INDEPENDENT_AMBULATORY_CARE_PROVIDER_SITE_OTHER): Payer: BC Managed Care – PPO | Admitting: Psychology

## 2017-02-23 DIAGNOSIS — F341 Dysthymic disorder: Secondary | ICD-10-CM

## 2017-02-23 NOTE — Progress Notes (Signed)
   THERAPIST PROGRESS NOTE  Session Time: 2.28pm-3.05pm  Participation Level: Active  Behavioral Response: Fairly GroomedAlertaffect wnl  Type of Therapy: Individual Therapy  Treatment Goals addressed: Diagnosis: Dysthymia and goal 1.  Interventions: CBT and Solution Focused  Summary: Wendy Levy is a 21 y.o. female who presents with affect wnL.  Pt reports she is tired today as didn't sleep last night.  Pt reports she ran out of her Paxil- has been waiting for refill- requested last week.  pt increased awareness re"adovcating for self through phone call to office" to f/u on request made in timely manner.  Pt reported that she was able to work today and has been working usually Monday through Saturday.  Pt discussed plans for Easter. Pt discussed want to get new used vehicle for self prior to housing.  Pt disucssed research she has been doing on renting house/apartment.  Pt increased awareness of need for budgeting w/ all expenses and take home income.  Pt reported she is getting along well w/ mom and mood has been ok.    Suicidal/Homicidal: Nowithout intent/plan  Therapist Response: assessed pt current functioning per pt report.  Provided education re: advocating for self re medication refills and any future delays.  Explored w/pt her wants for new vehicle and moving out.  Discussed priorities and budgeting to assist pt in problem solving and setting realistic expectations.   Plan: Return again in 2 weeks.  Diagnosis: Dysthymic d/o    YATES,LEANNE, [place] 02/23/2017

## 2017-02-24 ENCOUNTER — Ambulatory Visit (INDEPENDENT_AMBULATORY_CARE_PROVIDER_SITE_OTHER): Payer: BC Managed Care – PPO | Admitting: Psychiatry

## 2017-02-24 ENCOUNTER — Encounter (HOSPITAL_COMMUNITY): Payer: Self-pay | Admitting: Psychiatry

## 2017-02-24 DIAGNOSIS — F5105 Insomnia due to other mental disorder: Secondary | ICD-10-CM | POA: Diagnosis not present

## 2017-02-24 DIAGNOSIS — F902 Attention-deficit hyperactivity disorder, combined type: Secondary | ICD-10-CM | POA: Diagnosis not present

## 2017-02-24 DIAGNOSIS — F4310 Post-traumatic stress disorder, unspecified: Secondary | ICD-10-CM | POA: Diagnosis not present

## 2017-02-24 DIAGNOSIS — F341 Dysthymic disorder: Secondary | ICD-10-CM

## 2017-02-24 DIAGNOSIS — F99 Mental disorder, not otherwise specified: Secondary | ICD-10-CM

## 2017-02-24 DIAGNOSIS — F900 Attention-deficit hyperactivity disorder, predominantly inattentive type: Secondary | ICD-10-CM

## 2017-02-24 DIAGNOSIS — Z79899 Other long term (current) drug therapy: Secondary | ICD-10-CM

## 2017-02-24 DIAGNOSIS — F913 Oppositional defiant disorder: Secondary | ICD-10-CM

## 2017-02-24 MED ORDER — PAROXETINE HCL 20 MG PO TABS: 20.0000 mg | ORAL_TABLET | Freq: Every day | ORAL | 0 refills | Status: DC

## 2017-02-24 MED ORDER — LISDEXAMFETAMINE DIMESYLATE 60 MG PO CAPS: 60.0000 mg | ORAL_CAPSULE | ORAL | 0 refills | Status: DC

## 2017-02-24 MED ORDER — HYDROXYZINE HCL 10 MG PO TABS: ORAL_TABLET | ORAL | 3 refills | Status: DC

## 2017-02-24 NOTE — Progress Notes (Signed)
BH MD/PA/NP OP Progress Note  02/24/2017 2:23 PM Wendy Levy  MRN:  [id number]  Chief Complaint:  Chief Complaint    Follow-up     HPI: Pt states overall she is doing ok.   She is reporting some new onset nausea and dizziness. States it occurs when she is not busy.  ADHD- states Vyvanse is working well and she denies SE.  Pt denies depression. Denies anhedonia, isolation, crying spells, low motivation, poor hygiene, worthlessness and hopelessness. Denies SI/HI.  Sleep for last few nights has not been been restorative.  Appetite is decreased.  Energy is fair.   PTSD- states it has improved a lot. She is now able to tolerate crowds. No longer having flashbacks, nightmares and intrusive memories.  Pt ran out of Paxil one week ago.  Taking meds as prescribed and denies SE.    Visit Diagnosis:    ICD-9-CM ICD-10-CM   1. PTSD (post-traumatic stress disorder) 309.81 F43.10 PARoxetine (PAXIL) 20 MG tablet  2. 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 Attention deficit hyperactivity disorder (ADHD), predominantly inattentive type 314.00 F90.0 lisdexamfetamine (VYVANSE) 60 MG capsule     lisdexamfetamine (VYVANSE) 60 MG capsule     lisdexamfetamine (VYVANSE) 60 MG capsule  4. Insomnia due to other mental disorder 300.9 F51.05 hydrOXYzine (ATARAX/VISTARIL) 10 MG tablet   327.02 F99     Past Psychiatric History: see H&P  Past Medical History:  Past Medical History:  Diagnosis Date  . ADHD (attention deficit hyperactivity disorder)   . Allergy   . Anxiety   . Depression   . Dysmenorrhea    Cramps periodically.   Marland Kitchen History of chlamydia 2016  . Oppositional defiant disorder   . Shoulder dislocation    bilateral  . Wrist fracture, bilateral     Past Surgical History:  Procedure Laterality Date  . WISDOM TOOTH EXTRACTION  04/2013    Family Psychiatric History:  Family History  Problem Relation Age of Onset  . Adopted: Yes  . Depression Sister      reportedly in and out of treatment facilities, also dx w/ RAD  . Suicidality Sister     Social History:  Social History   Social History  . Marital status: Single    Spouse name: n/a  . Number of children: 0  . Years of education: N/A   Occupational History  . looking for work    Social History Main Topics  . Smoking status: Never Smoker  . Smokeless tobacco: Never Used  . Alcohol use No  . Drug use: No  . Sexual activity: Yes    Partners: Female    Birth control/ protection: Pill   Other Topics Concern  . None   Social History Narrative   Lives with her mother (adopted).  Her sister lives in [place], [place] with her own son Jamse Arn, born 12/31/2014.   Graduated from [employer]. Some classes at [employer].    Allergies:  Allergies  Allergen Reactions  . Benadryl Anti-Itch Childrens [Camphor] Rash    Rash with topical product    Metabolic Disorder Labs: Lab Results  Component Value Date   HGBA1C 5.2 02/12/2014   No results found for: PROLACTIN Lab Results  Component Value Date   CHOL 209 (H) 04/28/2016   TRIG 315 (H) 04/28/2016   HDL 51 04/28/2016   CHOLHDL 4.1 04/28/2016   VLDL 63 (H) 04/28/2016   LDLCALC 95 04/28/2016   LDLCALC 125 (H) 02/12/2014  Current Medications: Current Outpatient Prescriptions  Medication Sig Dispense Refill  . cetirizine (ZYRTEC) 10 MG tablet Take 10 mg by mouth daily as needed for allergies.    . hydrOXYzine (ATARAX/VISTARIL) 10 MG tablet Take 1-2 tabs po qHS prn insomnia 60 tablet 3  . lisdexamfetamine (VYVANSE) 60 MG capsule Take 1 capsule (60 mg total) by mouth every morning. 30 capsule 0  . lisdexamfetamine (VYVANSE) 60 MG capsule Take 1 capsule (60 mg total) by mouth every morning. 30 capsule 0  . lisdexamfetamine (VYVANSE) 60 MG capsule Take 1 capsule (60 mg total) by mouth every morning. 30 capsule 0  . LUTERA 0.1-20 MG-MCG tablet TAKE 1 TABLET BY MOUTH EVERY DAY 84 tablet 0  . Multiple Vitamin (MULTIVITAMIN)  capsule Take 1 capsule by mouth daily.    . naproxen sodium (ANAPROX DS) 550 MG tablet 1 tab po q 12 hours prn pain 30 tablet 2  . PARoxetine (PAXIL) 20 MG tablet Take 1 tablet (20 mg total) by mouth daily. 30 tablet 3  . PARoxetine (PAXIL) 20 MG tablet TAKE 1 TABLET(20 MG) BY MOUTH DAILY 30 tablet 0   No current facility-administered medications for this visit.     Musculoskeletal: Strength & Muscle Tone: within normal limits Gait & Station: normal Patient leans: N/A  Psychiatric Specialty Exam: Review of Systems  Gastrointestinal: Positive for nausea. Negative for abdominal pain, heartburn and vomiting.  Neurological: Positive for dizziness. Negative for tremors, sensory change, seizures, loss of consciousness and headaches.  Psychiatric/Behavioral: Negative for depression, hallucinations, substance abuse and suicidal ideas. The patient has insomnia. The patient is not nervous/anxious.     There were no vitals taken for this visit.There is no height or weight on file to calculate BMI.  General Appearance: Casual  Eye Contact:  Good  Speech:  Clear and Coherent and Normal Rate  Volume:  Normal  Mood:  Euthymic  Affect:  Congruent  Thought Process:  Goal Directed and Descriptions of Associations: Intact  Orientation:  Full (Time, Place, and Person)  Thought Content: WDL   Suicidal Thoughts:  No  Homicidal Thoughts:  No  Memory:  Immediate;   Good Recent;   Good Remote;   Good  Judgement:  Good  Insight:  Good  Psychomotor Activity:  Normal  Concentration:  Concentration: Good and Attention Span: Good  Recall:  Good  Fund of Knowledge: Good  Language: Good  Akathisia:  No  Handed:  Right  AIMS (if indicated):  n/a  Assets:  Communication Skills Desire for Improvement  ADL's:  Intact  Cognition: WNL  Sleep:  fair     Treatment Plan Summary:Medication management and Plan see below   Assessment: ADHD- combined type, ODD; dysthmic disoder; PTSD; insomnia    Medication management with supportive therapy. Risks/benefits and SE of the medication discussed. Pt verbalized understanding and verbal consent obtained for treatment.  Affirm with the patient that the medications are taken as ordered. Patient expressed understanding of how their medications were to be used.   The risk of un-intended pregnancy is high based on the fact that pt reports she is using condoms. Pt is aware that these meds carry a teratogenic risk. Pt will discuss plan of action if she does or plans to become pregnant in the future.   Meds:  ADHD combined type: continue Vyvanse 60mg  po qAM Dysthymic disorder and PTSD: continue Paxil 20mg  po qD Oppositional defiant disorder: well controlled Insomnia: continue Vistaril 10-20mg  po qHS prn insomnia  Labs: none  Therapy: brief supportive therapy provided. Discussed psychosocial stressors in detail.    Consultations:  None  Pt denies SI and is at an acute low risk for suicide. Patient told to call clinic if any problems occur. Patient advised to go to ER if they should develop SI/HI, side effects, or if symptoms worsen. Has crisis numbers to call if needed. Pt verbalized understanding.  F/up in 3 months or sooner if needed    [name], MD 02/24/2017, 2:23 PM

## 2017-03-08 ENCOUNTER — Ambulatory Visit (INDEPENDENT_AMBULATORY_CARE_PROVIDER_SITE_OTHER): Payer: BC Managed Care – PPO | Admitting: Psychology

## 2017-03-08 DIAGNOSIS — F341 Dysthymic disorder: Secondary | ICD-10-CM | POA: Diagnosis not present

## 2017-03-08 NOTE — Progress Notes (Signed)
   THERAPIST PROGRESS NOTE  Session Time: 2.30pm-3.pm  Participation Level: Active  Behavioral Response: Well GroomedAlertaffect wnl- tired  Type of Therapy: Individual Therapy  Treatment Goals addressed: Diagnosis: dysthymic d/o and goal 1.  Interventions: CBT and Supportive  Summary: Wendy Levy is a 21 y.o. female who presents with affect wnl.  Pt reports she is tired just getting off work and yawns throughout session.  Pt reported that she is sleeping well but does get low energy some days.  Pt reports no depressed moods.  pt reports she is enjoying work still and boyfriend has now been busy w/ work as well.  Pt reported that she is putting on hold looking for housing. Pt reported she enjoyed Easter w/ her family and that she and mom are getting along well.     Suicidal/Homicidal: No - no thoughts no plan.  Therapist Response: Assessed pt current functioning per pt report.  Processed w/pt interactions and engagement w/ family and community.  Explored w/pt mood and sleep.  Discussed engaging and enjoyable activities in waking moments.    Plan: Return again in 2 weeks. Consider reducing frequency if depressed mood remains decreased.   Diagnosis: Dysthymic D/O    Jan Fireman, [facility] 03/08/2017

## 2017-03-16 ENCOUNTER — Encounter: Payer: Self-pay | Admitting: Physician Assistant

## 2017-03-22 ENCOUNTER — Ambulatory Visit (INDEPENDENT_AMBULATORY_CARE_PROVIDER_SITE_OTHER): Payer: BC Managed Care – PPO | Admitting: Psychology

## 2017-03-22 DIAGNOSIS — F902 Attention-deficit hyperactivity disorder, combined type: Secondary | ICD-10-CM

## 2017-03-22 DIAGNOSIS — F341 Dysthymic disorder: Secondary | ICD-10-CM | POA: Diagnosis not present

## 2017-03-22 NOTE — Progress Notes (Signed)
   THERAPIST PROGRESS NOTE  Session Time: 2.30pm-3.pm  Participation Level: Active  Behavioral Response: Well GroomedAlertaffect wnl  Type of Therapy: Individual Therapy  Treatment Goals addressed: Diagnosis: Dysthymia and goal 1  Interventions: CBT and Supportive  Summary: Wendy Levy is a 21 y.o. female who presents with affect WNL.  Pt reported that work was busy today as short staffed and coworker wasn't willing to help. Pt reported that overall work is good.  Pt complained of leg pain and foot pain.  Pt agrees to go to PCP if continues.  Pt reported that she heard from sister that her biological father is being released from [place] this week and will be serving parole in the area living w/ her paternal grandmother.  Pt reports feeling mixed about this as part of her wants to reestablish relationship w/ father and other parts aware of pattern of disappointments w/ father making promises and not really being there.  Pt reported she has decided to see if in connection w/ sister and keep boundaries at this point otherwise.  Pt reports she is planning to save up from mom's birthday and 'do something for her for once".    Suicidal/Homicidal: Nowithout intent/plan  Therapist Response: Assessed pt current functioning per pt report.  Processed w/pt thoughts re: dad's release and plan for boundaries and contact.  Reflected to pt positive plan for acknowledging mom on her birthday.   Plan: Return again in 2 weeks.  Diagnosis: Dysthymia   Mckinnley Cottier, LPC 03/22/2017

## 2017-03-29 ENCOUNTER — Encounter: Payer: Self-pay | Admitting: Physician Assistant

## 2017-03-29 ENCOUNTER — Ambulatory Visit (INDEPENDENT_AMBULATORY_CARE_PROVIDER_SITE_OTHER): Payer: BC Managed Care – PPO | Admitting: Physician Assistant

## 2017-03-29 ENCOUNTER — Ambulatory Visit (INDEPENDENT_AMBULATORY_CARE_PROVIDER_SITE_OTHER): Payer: BC Managed Care – PPO

## 2017-03-29 VITALS — BP 123/87 | HR 109 | Temp 98.0°F | Resp 16 | Ht 64.0 in | Wt 276.8 lb

## 2017-03-29 DIAGNOSIS — N9089 Other specified noninflammatory disorders of vulva and perineum: Secondary | ICD-10-CM

## 2017-03-29 DIAGNOSIS — R202 Paresthesia of skin: Secondary | ICD-10-CM

## 2017-03-29 DIAGNOSIS — Z30013 Encounter for initial prescription of injectable contraceptive: Secondary | ICD-10-CM

## 2017-03-29 LAB — POCT URINE PREGNANCY: Preg Test, Ur: NEGATIVE

## 2017-03-29 MED ORDER — MEDROXYPROGESTERONE ACETATE 150 MG/ML IM SUSY
150.0000 mg | PREFILLED_SYRINGE | INTRAMUSCULAR | Status: AC
  Administered 2017-03-29 – 2018-02-01 (×3): 150 mg via INTRAMUSCULAR

## 2017-03-29 NOTE — Progress Notes (Signed)
Patient ID: Wendy Levy, female    DOB: 1995/12/01, 21 y.o.   MRN: [id number]  PCP: Harrison Mons, PA-C  Chief Complaint  Patient presents with  . Vaginal Itching    right side x 2 months     Subjective:   Presents for evaluation of an itchy lesion on the RIGHT vulva.  She first noticed what she thought was a "pimple" on the external genitalia. Pressing on it caused a tingling sensation. There has been no tenderness or drainage. She reports that she color of the area seems darker now than originally. She has shaved the pubis in the past, and had no pain or drainage or bleeding associated with that. No atypical vaginal discharge. No dyspareunia. She is sexually active with one female partner. Inconsistent condom use. She does not desire pregnancy, and desires Depo-Provera injections. Previously used COC. LMP was 4/20-24, normal for her.  In addition, she reports that at night, when she lies down to sleep, she feels like her "buttocks lock up. Like sharp needles poking me from the inside out." the sensation extends down to the mid thigh, posteriorly. She has to "crack or pop" the hip to get relief. No loss of bowel or bladder control. No LE weakness. No saddle anesthesia. No pain in the low back.  Is interested in starting a weight loss product: Truvision.  From the product web site, the main ingredients are: Alpha Lipoic Acid: Alpha Lipoic Acid (ALA), a beneficial fatty acid that is produced in every cell of the body. ALA helps convert glucose (blood sugar) into energy. It is also a powerful antioxidant that neutralizes potentially harmful molecules called free radicals. Free radicals have been linked to cancer, accelerated aging, and many other debilitating and potentially terminal conditions. Chlorogenic Acid: Cholorogenic Acid essentially retrains your body to more effectively seek out and utilize white adipose tissue (the undesirable type of fat) stores for energy. It helps  restore healthy blood sugar levels, promotes normal blood pressure, brain function and mental clarity. It also contributes to overall healthy blood chemistry. When blood chemistry is improved, virtually every major system and organ in the body benefits. Chromium: Chromium is known to help regulate glucose and assist in the metabolism of glucose on a cellular level to supply the body with energy. It is also reported to slow the loss of calcium, help prevent glaucoma, and regulate cholesterol. Low chromium levels may contribute to coronary artery disease. Chromium also assists the body in building lean muscle tissue while utilizing white adipose tissue in the body for energy. Copper: Copper helps the body produce red blood cells, which helps avoid anemic conditions. When taken with zinc and magnesium, it can also help promote calcium retention, especially in women during menopause. It may also assist in healthy skin and joint mobility. Magnesium: Magnesium can help promote calcium retention when taken with copper and zinc. It is also an effective preventative supplement. Raspberry Ketones: Raspberry ketones are an enzyme extracted from raspberries. This enzyme increases the production of the protein hormone adinopectin. This hormone controls metabolic deviations that could lead to weight gain. It's shown to be a complementary ingredient for metabolism regulation and also has a significant antioxidant benefit. Selenium: Selenium is a powerful antioxidant. Vanadium: Helps improve the efficacy of the body's naturally produced insulin. Insulin helps regulate glucose (blood sugar) and neutralizes blood sugar spikes. Zinc: Zinc is an essential trace element. It supports the immune system, contributes to healthy eyes and promotes healthy prostate. It also  may help individuals maintain mental clarity and concentration. Some athletes use zinc to encourage athletic performance and strength.    Review of  Systems As above.     Patient Active Problem List   Diagnosis Date Noted  . PTSD (post-traumatic stress disorder) 12/30/2015  . BMI 40.0-44.9, adult ([facility]) 02/05/2015  . Migraine, unspecified, without mention of intractable migraine without mention of status migrainosus 03/15/2013  . Major depressive disorder, recurrent episode ([facility]) 01/31/2012  . ADHD (attention deficit hyperactivity disorder), combined type 11/02/2011  . ODD (oppositional defiant disorder) 11/02/2011  . Dysthymic disorder 11/02/2011     Prior to Admission medications   Medication Sig Start Date End Date Taking? Authorizing Provider  cetirizine (ZYRTEC) 10 MG tablet Take 10 mg by mouth daily as needed for allergies.   Yes Historical Provider, MD  hydrOXYzine (ATARAX/VISTARIL) 10 MG tablet Take 1-2 tabs po qHS prn insomnia 02/24/17  Yes [name], MD  lisdexamfetamine (VYVANSE) 60 MG capsule Take 1 capsule (60 mg total) by mouth every morning. 02/24/17 06/03/17 Yes [name], MD  lisdexamfetamine (VYVANSE) 60 MG capsule Take 1 capsule (60 mg total) by mouth every morning. 02/24/17  Yes [name], MD  lisdexamfetamine (VYVANSE) 60 MG capsule Take 1 capsule (60 mg total) by mouth every morning. 02/24/17  Yes [name], MD  Multiple Vitamin (MULTIVITAMIN) capsule Take 1 capsule by mouth daily.   Yes Historical Provider, MD  naproxen sodium (ANAPROX DS) 550 MG tablet 1 tab po q 12 hours prn pain 11/12/16  Yes Zykeem Bauserman, PA-C  PARoxetine (PAXIL) 20 MG tablet Take 1 tablet (20 mg total) by mouth daily. 02/24/17 02/24/18 Yes [name], MD  LUTERA 0.1-20 MG-MCG tablet TAKE 1 TABLET BY MOUTH EVERY DAY Patient not taking: Reported on 03/29/2017 03/31/16   Harrison Mons, PA-C     Allergies  Allergen Reactions  . Benadryl Anti-Itch Childrens [Camphor] Rash    Rash with topical product       Objective:  Physical Exam  Constitutional: She is oriented to person, place, and time. She appears well-developed  and well-nourished. She is active and cooperative. No distress.  BP 123/87 (BP Location: Right Arm, Patient Position: Sitting, Cuff Size: Large)   Pulse (!) 109   Temp 98 F (36.7 C) (Oral)   Resp 16   Ht 5\' 4"  (1.626 m)   Wt 276 lb 12.8 oz (125.6 kg)   SpO2 97%   BMI 47.51 kg/m   HENT:  Head: Normocephalic and atraumatic.  Right Ear: Hearing normal.  Left Ear: Hearing normal.  Eyes: Conjunctivae are normal. No scleral icterus.  Neck: Normal range of motion. Neck supple. No thyromegaly present.  Cardiovascular: Normal rate, regular rhythm and normal heart sounds.   Pulses:      Radial pulses are 2+ on the right side, and 2+ on the left side.  Pulmonary/Chest: Effort normal and breath sounds normal.  Genitourinary:    No labial fusion. There is no rash, tenderness or injury on the right labia. There is no rash, tenderness or injury on the left labia.  Lymphadenopathy:       Head (right side): No tonsillar, no preauricular, no posterior auricular and no occipital adenopathy present.       Head (left side): No tonsillar, no preauricular, no posterior auricular and no occipital adenopathy present.    She has no cervical adenopathy.       Right: No supraclavicular adenopathy present.       Left: No supraclavicular adenopathy  present.  Neurological: She is alert and oriented to person, place, and time. No sensory deficit.  Skin: Skin is warm, dry and intact. No rash noted. No cyanosis or erythema. Nails show no clubbing.  Psychiatric: She has a normal mood and affect. Her speech is normal and behavior is normal.      Assessment & Plan:   1. Labial lesion Ingrowing hair. Resolved.  2. Paresthesia of buttock - DG Lumbar Spine Complete; Future  3. Paresthesia of both lower extremities - DG Pelvis 1-2 Views; Future  4. Encounter for initial prescription of injectable contraceptive Counseled on Depo-Provera and use of back-up condoms for 1 week (for contraception) and always (for  STI reduction) - POCT urine pregnancy - MedroxyPROGESTERone Acetate SUSY 150 mg; Inject 1 mL (150 mg total) into the muscle every 3 (three) months.  Advised that there is nothing in the Truvision that should cause problems with her current medications, but that there is also no evidence that it will help with weight loss. She ahs already purchased the product, so will try it. If ineffective, she will let me know, and I will refer her to [facility].  Return in about 12 weeks (around 06/21/2017) for Depo-Provera injection.   Fara Chute, PA-C [facility]

## 2017-03-29 NOTE — Patient Instructions (Addendum)
I do not see anything that would be dangerous for you taking the Truvision, but I also see that there is not any data that shows it to be effective for weight loss. If you would like, I am happy to refer you to the [facility].    IF you received an x-ray today, you will receive an invoice from [facility]. Please contact [facility] at [phone] with questions or concerns regarding your invoice.   IF you received labwork today, you will receive an invoice from [facility]. Please contact [facility] at [phone] with questions or concerns regarding your invoice.   Our billing staff will not be able to assist you with questions regarding bills from these companies.  You will be contacted with the lab results as soon as they are available. The fastest way to get your results is to activate your My Chart account. Instructions are located on the last page of this paperwork. If you have not heard from [place] regarding the results in 2 weeks, please contact this office.

## 2017-04-02 ENCOUNTER — Encounter: Payer: Self-pay | Admitting: Physician Assistant

## 2017-04-19 ENCOUNTER — Ambulatory Visit (INDEPENDENT_AMBULATORY_CARE_PROVIDER_SITE_OTHER): Payer: BC Managed Care – PPO | Admitting: Psychology

## 2017-04-19 DIAGNOSIS — F341 Dysthymic disorder: Secondary | ICD-10-CM | POA: Diagnosis not present

## 2017-04-19 NOTE — Progress Notes (Signed)
   THERAPIST PROGRESS NOTE  Session Time: 2.35pm-3.05pm  Participation Level: Active  Behavioral Response: Fairly GroomedAlertaffect wnl  Type of Therapy: Individual Therapy  Treatment Goals addressed: Diagnosis: Dysthymia and goal 1.  Interventions: CBT and Supportive  Summary: Wendy Levy is a 21 y.o. female who presents with affect wnl.  Pt reports that she is doing ok. Pt reports that interactions w/ mom have been good, pt reports interactions w/ boyfriend are good. Pt reported that her bio dad wasn't released as told in April- pt reports no worry re:Marland Kitchen  Pt informed that she is continuing to enjoy work- pt reports that boyfriend is paying mom o stay at house. Pt reports she is paying of money she owes mom. Pt reports that feels that mom might be tired of them living there.  Pt considers talking to mom about how much she still has to pay of her loan to mom.     Suicidal/Homicidal: Nowithout intent/plan  Therapist Response: Assessed pt current functioning per pt report.  Explored w/pt interactions w/ family and coworkers.  Processed w/pt her "feeling" re: mom's wants and encouraged pt communication re: mom's thoughts,  her progress towards debt repayment and planning further towards independence.  Plan: Return again in 2 weeks.  Diagnosis: Dysthymia    Edoardo Laforte, LPC 04/19/2017

## 2017-05-04 ENCOUNTER — Ambulatory Visit (INDEPENDENT_AMBULATORY_CARE_PROVIDER_SITE_OTHER): Payer: BC Managed Care – PPO | Admitting: Psychology

## 2017-05-04 ENCOUNTER — Encounter: Payer: Self-pay | Admitting: Physician Assistant

## 2017-05-04 DIAGNOSIS — F341 Dysthymic disorder: Secondary | ICD-10-CM | POA: Diagnosis not present

## 2017-05-04 NOTE — Progress Notes (Signed)
   THERAPIST PROGRESS NOTE  Session Time: 2.27pm-2.58pm  Participation Level: Active  Behavioral Response: Fairly GroomedAlertaffect wnl  Type of Therapy: Individual Therapy  Treatment Goals addressed: Diagnosis: Dysthymia and goal 1.  Interventions: CBT and Supportive  Summary: Stela Iwasaki is a 21 y.o. female who presents with affect wnl.  Pt worked today and reports work has been good- but wishes opportunity to move up from [employer].  Pt reported recent conflict w/ friend- this is peer that she has had a lot of conflict w/ in past.  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  Pt reported that she has recognized need more space again that can't spend time together- but feels that will still be present when needed for listening ear.  Pt reported that she feels she can keep these boundaries.  Pt discussed positive interactions w/ boyfriend and parent.  Suicidal/Homicidal: Nowithout intent/plan  Therapist Response: Assessed pt current functioning per pt report.  Processed w/pt coping w/ stressor and conflict w/ friend.  Discussed boundaries to establish and enforcing.  Plan: Return again in 2 weeks.  Diagnosis:Dysthymia    Jan Fireman, [facility] 05/04/2017

## 2017-05-09 ENCOUNTER — Encounter: Payer: Self-pay | Admitting: Family Medicine

## 2017-05-09 ENCOUNTER — Ambulatory Visit (INDEPENDENT_AMBULATORY_CARE_PROVIDER_SITE_OTHER): Payer: BC Managed Care – PPO | Admitting: Family Medicine

## 2017-05-09 VITALS — BP 118/79 | HR 105 | Temp 98.4°F | Resp 18 | Ht 64.0 in | Wt 265.8 lb

## 2017-05-09 DIAGNOSIS — J9801 Acute bronchospasm: Secondary | ICD-10-CM

## 2017-05-09 DIAGNOSIS — R05 Cough: Secondary | ICD-10-CM

## 2017-05-09 DIAGNOSIS — J069 Acute upper respiratory infection, unspecified: Secondary | ICD-10-CM

## 2017-05-09 DIAGNOSIS — R059 Cough, unspecified: Secondary | ICD-10-CM

## 2017-05-09 MED ORDER — ALBUTEROL SULFATE HFA 108 (90 BASE) MCG/ACT IN AERS: 1.0000 | INHALATION_SPRAY | RESPIRATORY_TRACT | 0 refills | Status: DC | PRN

## 2017-05-09 NOTE — Progress Notes (Signed)
Subjective:  By signing my name below, I, Moises Blood, attest that this documentation has been prepared under the direction and in the presence of Merri Ray, MD. Electronically Signed: Moises Blood, [place]. 05/09/2017 , 3:25 PM .  Patient was seen in Room 10 .   Patient ID: Wendy Levy, female    DOB: 1996/06/05, 21 y.o.   MRN: [id number] Chief Complaint  Patient presents with  . Cough    feels like somethins stuck in throat x 3, runny nose   HPI Wendy Levy is a 21 y.o. female  Here for multiple concerns today, but her primary concern is cough.   Patient states that her symptoms have started about a week ago. When she has a coughing fit, she would cough up some discolored green mucus, which changed from initial dark green mucus. She reports that it has gotten better over the past few days. She's been taking dayquil and nyquil with relief from nyquil recently, but not much relief from the dayquil. She mentions her nephew, sister and brother-in-law have had similar symptoms at home. She denies history of asthma. She is around a smoker at home.   She also informs having some knees and ankle issues, but agrees to return for another visit to discuss it.   Patient Active Problem List   Diagnosis Date Noted  . PTSD (post-traumatic stress disorder) 12/30/2015  . BMI 40.0-44.9, adult ([place]) 02/05/2015  . Migraine, unspecified, without mention of intractable migraine without mention of status migrainosus 03/15/2013  . Major depressive disorder, recurrent episode ([place]) 01/31/2012  . ADHD (attention deficit hyperactivity disorder), combined type 11/02/2011  . ODD (oppositional defiant disorder) 11/02/2011  . Dysthymic disorder 11/02/2011   Past Medical History:  Diagnosis Date  . ADHD (attention deficit hyperactivity disorder)   . Allergy   . Anxiety   . Depression   . Dysmenorrhea    Cramps periodically.   Marland Kitchen History of chlamydia 2016  . Oppositional defiant disorder   .  Shoulder dislocation    bilateral  . Wrist fracture, bilateral    Past Surgical History:  Procedure Laterality Date  . WISDOM TOOTH EXTRACTION  04/2013   Allergies  Allergen Reactions  . Benadryl Anti-Itch Childrens [Camphor] Rash    Rash with topical product   Prior to Admission medications   Medication Sig Start Date End Date Taking? Authorizing Provider  cetirizine (ZYRTEC) 10 MG tablet Take 10 mg by mouth daily as needed for allergies.    [provider]  hydrOXYzine (ATARAX/VISTARIL) 10 MG tablet Take 1-2 tabs po qHS prn insomnia 02/24/17   [name], MD  lisdexamfetamine (VYVANSE) 60 MG capsule Take 1 capsule (60 mg total) by mouth every morning. 02/24/17 06/03/17  [name], MD  lisdexamfetamine (VYVANSE) 60 MG capsule Take 1 capsule (60 mg total) by mouth every morning. 02/24/17   [name], MD  lisdexamfetamine (VYVANSE) 60 MG capsule Take 1 capsule (60 mg total) by mouth every morning. 02/24/17   [name], MD  Multiple Vitamin (MULTIVITAMIN) capsule Take 1 capsule by mouth daily.    [provider]  naproxen sodium (ANAPROX DS) 550 MG tablet 1 tab po q 12 hours prn pain 11/12/16   Jeffery, Chelle, PA-C  PARoxetine (PAXIL) 20 MG tablet Take 1 tablet (20 mg total) by mouth daily. 02/24/17 02/24/18  [name], MD   Social History   Social History  . Marital status: Single    Spouse name: n/a  . Number of children: 0  .  Years of education: N/A   Occupational History  . looking for work    Social History Main Topics  . Smoking status: Never Smoker  . Smokeless tobacco: Never Used  . Alcohol use No  . Drug use: No  . Sexual activity: Yes    Partners: Female    Birth control/ protection: Pill, Injection     Comment: 03/29/17   Other Topics Concern  . Not on file   Social History Narrative   Lives with her mother (adopted).  Her sister lives in [place], [place] with her own son Wendy Levy, born 12/31/2014.   Graduated from [employer]. Some classes at [employer].   Review of Systems  Constitutional: Negative for chills, fatigue, fever and unexpected weight change.  HENT: Positive for congestion and rhinorrhea.   Respiratory: Positive for cough and wheezing.   Gastrointestinal: Negative for constipation, diarrhea, nausea and vomiting.  Skin: Negative for rash and wound.  Neurological: Negative for dizziness, weakness and headaches.       Objective:   Physical Exam  Constitutional: She is oriented to person, place, and time. She appears well-developed and well-nourished. No distress.  HENT:  Head: Normocephalic and atraumatic.  Right Ear: Hearing, tympanic membrane, external ear and ear canal normal.  Left Ear: Hearing, tympanic membrane, external ear and ear canal normal.  Nose: Nose normal.  Mouth/Throat: Oropharynx is clear and moist. No oropharyngeal exudate.  Eyes: Conjunctivae and EOM are normal. Pupils are equal, round, and reactive to light.  Cardiovascular: Normal rate, regular rhythm, normal heart sounds and intact distal pulses.   No murmur heard. Pulmonary/Chest: Effort normal and breath sounds normal. No respiratory distress. She has no wheezes. She has no rhonchi.  Few coarse breath sounds with possible faint wheeze  Neurological: She is alert and oriented to person, place, and time.  Skin: Skin is warm and dry. No rash noted.  Psychiatric: She has a normal mood and affect. Her behavior is normal.  Vitals reviewed.   Vitals:   05/09/17 1437  BP: 118/79  Pulse: (!) 105  Resp: 18  Temp: 98.4 F (36.9 C)  TempSrc: Oral  SpO2: 96%  Weight: 265 lb 12.8 oz (120.6 kg)  Height: 5\' 4"  (1.626 m)      Assessment & Plan:    Wendy Levy is a 21 y.o. female Acute upper respiratory infection  Cough - Plan: albuterol (PROVENTIL HFA;VENTOLIN HFA) 108 (90 Base) MCG/ACT inhaler  Bronchospasm - Plan: albuterol (PROVENTIL HFA;VENTOLIN HFA) 108 (90 Base) MCG/ACT inhaler  Cough with likely  viral syndrome, initial 6/discolored mucus that is improving. Overall symptoms are improving. Possible slight faint wheeze on exam, no known history of asthma.  -Continue symptomatic care, albuterol if needed for wheezing, RTC precautions if persistent or frequent use.  Did want to discuss knee/ankle issues, and plans on following up at separate visit to review those issues.  Meds ordered this encounter  Medications  . albuterol (PROVENTIL HFA;VENTOLIN HFA) 108 (90 Base) MCG/ACT inhaler    Sig: Inhale 1-2 puffs into the lungs every 4 (four) hours as needed for wheezing or shortness of breath.    Dispense:  1 Inhaler    Refill:  0   Patient Instructions    Saline nasal spray over the counter as needed, over the counter mucinex or mucinex DM for cough if it does not trigger wheezing. drink plenty of fluids.  Albuterol up to every 4-6 hours as needed. If requiring albuterol sooner than 4 hours or persistent  need in next 3-4 days, return for recheck.   If not continuing to improve this week, or worsening - return for possible chest xray or antibiotics.   Follow-up with me in the next week if possible to discuss knee and ankle issues. I am happy to recheck your cough at that time as well.   Cough, Adult Coughing is a reflex that clears your throat and your airways. Coughing helps to heal and protect your lungs. It is normal to cough occasionally, but a cough that happens with other symptoms or lasts a long time may be a sign of a condition that needs treatment. A cough may last only 2-3 weeks (acute), or it may last longer than 8 weeks (chronic). What are the causes? Coughing is commonly caused by:  Breathing in substances that irritate your lungs.  A viral or bacterial respiratory infection.  Allergies.  Asthma.  Postnasal drip.  Smoking.  Acid backing up from the stomach into the esophagus (gastroesophageal reflux).  Certain medicines.  Chronic lung problems, including COPD (or  rarely, lung cancer).  Other medical conditions such as heart failure.  Follow these instructions at home: Pay attention to any changes in your symptoms. Take these actions to help with your discomfort:  Take medicines only as told by your health care provider. ? If you were prescribed an antibiotic medicine, take it as told by your health care provider. Do not stop taking the antibiotic even if you start to feel better. ? Talk with your health care provider before you take a cough suppressant medicine.  Drink enough fluid to keep your urine clear or pale yellow.  If the air is dry, use a cold steam vaporizer or humidifier in your bedroom or your home to help loosen secretions.  Avoid anything that causes you to cough at work or at home.  If your cough is worse at night, try sleeping in a semi-upright position.  Avoid cigarette smoke. If you smoke, quit smoking. If you need help quitting, ask your health care provider.  Avoid caffeine.  Avoid alcohol.  Rest as needed.  Contact a health care provider if:  You have new symptoms.  You cough up pus.  Your cough does not get better after 2-3 weeks, or your cough gets worse.  You cannot control your cough with suppressant medicines and you are losing sleep.  You develop pain that is getting worse or pain that is not controlled with pain medicines.  You have a fever.  You have unexplained weight loss.  You have night sweats. Get help right away if:  You cough up blood.  You have difficulty breathing.  Your heartbeat is very fast. This information is not intended to replace advice given to you by your health care provider. Make sure you discuss any questions you have with your health care provider. Document Released: 05/14/2011 Document Revised: 04/22/2016 Document Reviewed: 01/22/2015 Elsevier Interactive Patient Education  2017 [employer] Inc[employer]  Bronchospasm, Adult Bronchospasm is a tightening of the airways going into  the lungs. During an episode, it may be harder to breathe. You may cough, and you may make a whistling sound when you breathe (wheeze). This condition often affects people with asthma. What are the causes? This condition is caused by swelling and irritation in the airways. It can be triggered by:  An infection (common).  Seasonal allergies.  An allergic reaction.  Exercise.  Irritants. These include pollution, cigarette smoke, strong odors, aerosol sprays, and paint fumes.  Weather  changes. Winds increase molds and pollens in the air. Cold air may cause swelling.  Stress and emotional upset.  What are the signs or symptoms? Symptoms of this condition include:  Wheezing. If the episode was triggered by an allergy, wheezing may start right away or hours later.  Nighttime coughing.  Frequent or severe coughing with a simple cold.  Chest tightness.  Shortness of breath.  Decreased ability to exercise.  How is this diagnosed? This condition is usually diagnosed with a review of your medical history and a physical exam. Tests, such as lung function tests, are sometimes done to look for other conditions. The need for a chest X-ray depends on where the wheezing occurs and whether it is the first time you have wheezed. How is this treated? This condition may be treated with:  Inhaled medicines. These open up the airways and help you breathe. They can be taken with an inhaler or a nebulizer device.  Corticosteroid medicines. These may be given for severe bronchospasm, usually when it is associated with asthma.  Avoiding triggers, such as irritants, infection, or allergies.  Follow these instructions at home: Medicines  Take over-the-counter and prescription medicines only as told by your health care provider.  If you need to use an inhaler or nebulizer to take your medicine, ask your health care provider to explain how to use it correctly. If you were given a spacer, always  use it with your inhaler. Lifestyle  Reduce the number of triggers in your home. To do this: ? Change your heating and air conditioning filter at least once a month. ? Limit your use of fireplaces and wood stoves. ? Do not smoke. Do not allow smoking in your home. ? Avoid using perfumes and fragrances. ? Get rid of pests, such as roaches and mice, and their droppings. ? Remove any mold from your home. ? Keep your house clean and dust free. Use unscented cleaning products. ? Replace carpet with wood, tile, or vinyl flooring. Carpet can trap dander and dust. ? Use allergy-proof pillows, mattress covers, and box spring covers. ? Wash bed sheets and blankets every week in hot water. Dry them in a dryer. ? Use blankets that are made of polyester or cotton. ? Wash your hands often. ? Do not allow pets in your bedroom.  Avoid breathing in cold air when you exercise. General instructions  Have a plan for seeking medical care. Know when to call your health care provider and local emergency services, and where to get emergency care.  Stay up to date on your immunizations.  When you have an episode of bronchospasm, stay calm. Try to relax and breathe more slowly.  If you have asthma, make sure you have an asthma action plan.  Keep all follow-up visits as told by your health care provider. This is important. Contact a health care provider if:  You have muscle aches.  You have chest pain.  The mucus that you cough up (sputum) changes from clear or white to yellow, green, gray, or bloody.  You have a fever.  Your sputum gets thicker. Get help right away if:  Your wheezing and coughing get worse, even after you take your prescribed medicines.  It gets even harder to breathe.  You develop severe chest pain. Summary  Bronchospasm is a tightening of the airways going into the lungs.  During an episode of bronchospasm, you may have a harder time breathing. You may cough and make a  whistling sound when  you breathe (wheeze).  Avoid exposure to triggers such as smoke, dust, mold, animal dander, and fragrances.  When you have an episode of bronchospasm, stay calm. Try to relax and breathe more slowly. This information is not intended to replace advice given to you by your health care provider. Make sure you discuss any questions you have with your health care provider. Document Released: 11/18/2003 Document Revised: 11/11/2016 Document Reviewed: 11/11/2016 Elsevier Interactive Patient Education  2017 [place]  Upper Respiratory Infection, Adult Most upper respiratory infections (URIs) are a viral infection of the air passages leading to the lungs. A URI affects the nose, throat, and upper air passages. The most common type of URI is nasopharyngitis and is typically referred to as "the common cold." URIs run their course and usually go away on their own. Most of the time, a URI does not require medical attention, but sometimes a bacterial infection in the upper airways can follow a viral infection. This is called a secondary infection. Sinus and middle ear infections are common types of secondary upper respiratory infections. Bacterial pneumonia can also complicate a URI. A URI can worsen asthma and chronic obstructive pulmonary disease (COPD). Sometimes, these complications can require emergency medical care and may be life threatening. What are the causes? Almost all URIs are caused by viruses. A virus is a type of germ and can spread from one person to another. What increases the risk? You may be at risk for a URI if:  You smoke.  You have chronic heart or lung disease.  You have a weakened defense (immune) system.  You are very young or very old.  You have nasal allergies or asthma.  You work in crowded or poorly ventilated areas.  You work in health care facilities or schools.  What are the signs or symptoms? Symptoms typically develop 2-3 days after you  come in contact with a cold virus. Most viral URIs last 7-10 days. However, viral URIs from the influenza virus (flu virus) can last 14-18 days and are typically more severe. Symptoms may include:  Runny or stuffy (congested) nose.  Sneezing.  Cough.  Sore throat.  Headache.  Fatigue.  Fever.  Loss of appetite.  Pain in your forehead, behind your eyes, and over your cheekbones (sinus pain).  Muscle aches.  How is this diagnosed? Your health care provider may diagnose a URI by:  Physical exam.  Tests to check that your symptoms are not due to another condition such as: ? Strep throat. ? Sinusitis. ? Pneumonia. ? Asthma.  How is this treated? A URI goes away on its own with time. It cannot be cured with medicines, but medicines may be prescribed or recommended to relieve symptoms. Medicines may help:  Reduce your fever.  Reduce your cough.  Relieve nasal congestion.  Follow these instructions at home:  Take medicines only as directed by your health care provider.  Gargle warm saltwater or take cough drops to comfort your throat as directed by your health care provider.  Use a warm mist humidifier or inhale steam from a shower to increase air moisture. This may make it easier to breathe.  Drink enough fluid to keep your urine clear or pale yellow.  Eat soups and other clear broths and maintain good nutrition.  Rest as needed.  Return to work when your temperature has returned to normal or as your health care provider advises. You may need to stay home longer to avoid infecting others. You can also use  a face mask and careful hand washing to prevent spread of the virus.  Increase the usage of your inhaler if you have asthma.  Do not use any tobacco products, including cigarettes, chewing tobacco, or electronic cigarettes. If you need help quitting, ask your health care provider. How is this prevented? The best way to protect yourself from getting a cold is to  practice good hygiene.  Avoid oral or hand contact with people with cold symptoms.  Wash your hands often if contact occurs.  There is no clear evidence that vitamin C, vitamin E, echinacea, or exercise reduces the chance of developing a cold. However, it is always recommended to get plenty of rest, exercise, and practice good nutrition. Contact a health care provider if:  You are getting worse rather than better.  Your symptoms are not controlled by medicine.  You have chills.  You have worsening shortness of breath.  You have brown or red mucus.  You have yellow or brown nasal discharge.  You have pain in your face, especially when you bend forward.  You have a fever.  You have swollen neck glands.  You have pain while swallowing.  You have white areas in the back of your throat. Get help right away if:  You have severe or persistent: ? Headache. ? Ear pain. ? Sinus pain. ? Chest pain.  You have chronic lung disease and any of the following: ? Wheezing. ? Prolonged cough. ? Coughing up blood. ? A change in your usual mucus.  You have a stiff neck.  You have changes in your: ? Vision. ? Hearing. ? Thinking. ? Mood. This information is not intended to replace advice given to you by your health care provider. Make sure you discuss any questions you have with your health care provider. Document Released: 05/11/2001 Document Revised: 07/18/2016 Document Reviewed: 02/20/2014 Elsevier Interactive Patient Education  2017 [employer].   IF you received an x-ray today, you will receive an invoice from [facility]. Please contact [facility] at [phone] with questions or concerns regarding your invoice.   IF you received labwork today, you will receive an invoice from [facility]. Please contact [facility] at [phone] with questions or concerns regarding your invoice.   Our billing staff will not be able to assist you with questions regarding  bills from these companies.  You will be contacted with the lab results as soon as they are available. The fastest way to get your results is to activate your My Chart account. Instructions are located on the last page of this paperwork. If you have not heard from [place] regarding the results in 2 weeks, please contact this office.      I personally performed the services described in this documentation, which was scribed in my presence. The recorded information has been reviewed and considered for accuracy and completeness, addended by me as needed, and agree with information above.  Signed,   Merri Ray, MD [facility].  05/09/17 3:29 PM

## 2017-05-09 NOTE — Patient Instructions (Addendum)
Saline nasal spray over the counter as needed, over the counter mucinex or mucinex DM for cough if it does not trigger wheezing. drink plenty of fluids.  Albuterol up to every 4-6 hours as needed. If requiring albuterol sooner than 4 hours or persistent need in next 3-4 days, return for recheck.   If not continuing to improve this week, or worsening - return for possible chest xray or antibiotics.   Follow-up with me in the next week if possible to discuss knee and ankle issues. I am happy to recheck your cough at that time as well.   Cough, Adult Coughing is a reflex that clears your throat and your airways. Coughing helps to heal and protect your lungs. It is normal to cough occasionally, but a cough that happens with other symptoms or lasts a long time may be a sign of a condition that needs treatment. A cough may last only 2-3 weeks (acute), or it may last longer than 8 weeks (chronic). What are the causes? Coughing is commonly caused by:  Breathing in substances that irritate your lungs.  A viral or bacterial respiratory infection.  Allergies.  Asthma.  Postnasal drip.  Smoking.  Acid backing up from the stomach into the esophagus (gastroesophageal reflux).  Certain medicines.  Chronic lung problems, including COPD (or rarely, lung cancer).  Other medical conditions such as heart failure.  Follow these instructions at home: Pay attention to any changes in your symptoms. Take these actions to help with your discomfort:  Take medicines only as told by your health care provider. ? If you were prescribed an antibiotic medicine, take it as told by your health care provider. Do not stop taking the antibiotic even if you start to feel better. ? Talk with your health care provider before you take a cough suppressant medicine.  Drink enough fluid to keep your urine clear or pale yellow.  If the air is dry, use a cold steam vaporizer or humidifier in your bedroom or your home to  help loosen secretions.  Avoid anything that causes you to cough at work or at home.  If your cough is worse at night, try sleeping in a semi-upright position.  Avoid cigarette smoke. If you smoke, quit smoking. If you need help quitting, ask your health care provider.  Avoid caffeine.  Avoid alcohol.  Rest as needed.  Contact a health care provider if:  You have new symptoms.  You cough up pus.  Your cough does not get better after 2-3 weeks, or your cough gets worse.  You cannot control your cough with suppressant medicines and you are losing sleep.  You develop pain that is getting worse or pain that is not controlled with pain medicines.  You have a fever.  You have unexplained weight loss.  You have night sweats. Get help right away if:  You cough up blood.  You have difficulty breathing.  Your heartbeat is very fast. This information is not intended to replace advice given to you by your health care provider. Make sure you discuss any questions you have with your health care provider. Document Released: 05/14/2011 Document Revised: 04/22/2016 Document Reviewed: 01/22/2015 Elsevier Interactive Patient Education  2017 [employer]  Bronchospasm, Adult Bronchospasm is a tightening of the airways going into the lungs. During an episode, it may be harder to breathe. You may cough, and you may make a whistling sound when you breathe (wheeze). This condition often affects people with asthma. What are the causes? This  condition is caused by swelling and irritation in the airways. It can be triggered by:  An infection (common).  Seasonal allergies.  An allergic reaction.  Exercise.  Irritants. These include pollution, cigarette smoke, strong odors, aerosol sprays, and paint fumes.  Weather changes. Winds increase molds and pollens in the air. Cold air may cause swelling.  Stress and emotional upset.  What are the signs or symptoms? Symptoms of this  condition include:  Wheezing. If the episode was triggered by an allergy, wheezing may start right away or hours later.  Nighttime coughing.  Frequent or severe coughing with a simple cold.  Chest tightness.  Shortness of breath.  Decreased ability to exercise.  How is this diagnosed? This condition is usually diagnosed with a review of your medical history and a physical exam. Tests, such as lung function tests, are sometimes done to look for other conditions. The need for a chest X-ray depends on where the wheezing occurs and whether it is the first time you have wheezed. How is this treated? This condition may be treated with:  Inhaled medicines. These open up the airways and help you breathe. They can be taken with an inhaler or a nebulizer device.  Corticosteroid medicines. These may be given for severe bronchospasm, usually when it is associated with asthma.  Avoiding triggers, such as irritants, infection, or allergies.  Follow these instructions at home: Medicines  Take over-the-counter and prescription medicines only as told by your health care provider.  If you need to use an inhaler or nebulizer to take your medicine, ask your health care provider to explain how to use it correctly. If you were given a spacer, always use it with your inhaler. Lifestyle  Reduce the number of triggers in your home. To do this: ? Change your heating and air conditioning filter at least once a month. ? Limit your use of fireplaces and wood stoves. ? Do not smoke. Do not allow smoking in your home. ? Avoid using perfumes and fragrances. ? Get rid of pests, such as roaches and mice, and their droppings. ? Remove any mold from your home. ? Keep your house clean and dust free. Use unscented cleaning products. ? Replace carpet with wood, tile, or vinyl flooring. Carpet can trap dander and dust. ? Use allergy-proof pillows, mattress covers, and box spring covers. ? Wash bed sheets and  blankets every week in hot water. Dry them in a dryer. ? Use blankets that are made of polyester or cotton. ? Wash your hands often. ? Do not allow pets in your bedroom.  Avoid breathing in cold air when you exercise. General instructions  Have a plan for seeking medical care. Know when to call your health care provider and local emergency services, and where to get emergency care.  Stay up to date on your immunizations.  When you have an episode of bronchospasm, stay calm. Try to relax and breathe more slowly.  If you have asthma, make sure you have an asthma action plan.  Keep all follow-up visits as told by your health care provider. This is important. Contact a health care provider if:  You have muscle aches.  You have chest pain.  The mucus that you cough up (sputum) changes from clear or white to yellow, green, gray, or bloody.  You have a fever.  Your sputum gets thicker. Get help right away if:  Your wheezing and coughing get worse, even after you take your prescribed medicines.  It gets even  harder to breathe.  You develop severe chest pain. Summary  Bronchospasm is a tightening of the airways going into the lungs.  During an episode of bronchospasm, you may have a harder time breathing. You may cough and make a whistling sound when you breathe (wheeze).  Avoid exposure to triggers such as smoke, dust, mold, animal dander, and fragrances.  When you have an episode of bronchospasm, stay calm. Try to relax and breathe more slowly. This information is not intended to replace advice given to you by your health care provider. Make sure you discuss any questions you have with your health care provider. Document Released: 11/18/2003 Document Revised: 11/11/2016 Document Reviewed: 11/11/2016 Elsevier Interactive Patient Education  2017 [place]  Upper Respiratory Infection, Adult Most upper respiratory infections (URIs) are a viral infection of the air passages  leading to the lungs. A URI affects the nose, throat, and upper air passages. The most common type of URI is nasopharyngitis and is typically referred to as "the common cold." URIs run their course and usually go away on their own. Most of the time, a URI does not require medical attention, but sometimes a bacterial infection in the upper airways can follow a viral infection. This is called a secondary infection. Sinus and middle ear infections are common types of secondary upper respiratory infections. Bacterial pneumonia can also complicate a URI. A URI can worsen asthma and chronic obstructive pulmonary disease (COPD). Sometimes, these complications can require emergency medical care and may be life threatening. What are the causes? Almost all URIs are caused by viruses. A virus is a type of germ and can spread from one person to another. What increases the risk? You may be at risk for a URI if:  You smoke.  You have chronic heart or lung disease.  You have a weakened defense (immune) system.  You are very young or very old.  You have nasal allergies or asthma.  You work in crowded or poorly ventilated areas.  You work in health care facilities or schools.  What are the signs or symptoms? Symptoms typically develop 2-3 days after you come in contact with a cold virus. Most viral URIs last 7-10 days. However, viral URIs from the influenza virus (flu virus) can last 14-18 days and are typically more severe. Symptoms may include:  Runny or stuffy (congested) nose.  Sneezing.  Cough.  Sore throat.  Headache.  Fatigue.  Fever.  Loss of appetite.  Pain in your forehead, behind your eyes, and over your cheekbones (sinus pain).  Muscle aches.  How is this diagnosed? Your health care provider may diagnose a URI by:  Physical exam.  Tests to check that your symptoms are not due to another condition such as: ? Strep throat. ? Sinusitis. ? Pneumonia. ? Asthma.  How is  this treated? A URI goes away on its own with time. It cannot be cured with medicines, but medicines may be prescribed or recommended to relieve symptoms. Medicines may help:  Reduce your fever.  Reduce your cough.  Relieve nasal congestion.  Follow these instructions at home:  Take medicines only as directed by your health care provider.  Gargle warm saltwater or take cough drops to comfort your throat as directed by your health care provider.  Use a warm mist humidifier or inhale steam from a shower to increase air moisture. This may make it easier to breathe.  Drink enough fluid to keep your urine clear or pale yellow.  Eat soups  and other clear broths and maintain good nutrition.  Rest as needed.  Return to work when your temperature has returned to normal or as your health care provider advises. You may need to stay home longer to avoid infecting others. You can also use a face mask and careful hand washing to prevent spread of the virus.  Increase the usage of your inhaler if you have asthma.  Do not use any tobacco products, including cigarettes, chewing tobacco, or electronic cigarettes. If you need help quitting, ask your health care provider. How is this prevented? The best way to protect yourself from getting a cold is to practice good hygiene.  Avoid oral or hand contact with people with cold symptoms.  Wash your hands often if contact occurs.  There is no clear evidence that vitamin C, vitamin E, echinacea, or exercise reduces the chance of developing a cold. However, it is always recommended to get plenty of rest, exercise, and practice good nutrition. Contact a health care provider if:  You are getting worse rather than better.  Your symptoms are not controlled by medicine.  You have chills.  You have worsening shortness of breath.  You have brown or red mucus.  You have yellow or brown nasal discharge.  You have pain in your face, especially when you  bend forward.  You have a fever.  You have swollen neck glands.  You have pain while swallowing.  You have white areas in the back of your throat. Get help right away if:  You have severe or persistent: ? Headache. ? Ear pain. ? Sinus pain. ? Chest pain.  You have chronic lung disease and any of the following: ? Wheezing. ? Prolonged cough. ? Coughing up blood. ? A change in your usual mucus.  You have a stiff neck.  You have changes in your: ? Vision. ? Hearing. ? Thinking. ? Mood. This information is not intended to replace advice given to you by your health care provider. Make sure you discuss any questions you have with your health care provider. Document Released: 05/11/2001 Document Revised: 07/18/2016 Document Reviewed: 02/20/2014 Elsevier Interactive Patient Education  2017 [employer].   IF you received an x-ray today, you will receive an invoice from [facility]. Please contact [facility] at [phone] with questions or concerns regarding your invoice.   IF you received labwork today, you will receive an invoice from [place]. Please contact [facility] at [phone] with questions or concerns regarding your invoice.   Our billing staff will not be able to assist you with questions regarding bills from these companies.  You will be contacted with the lab results as soon as they are available. The fastest way to get your results is to activate your My Chart account. Instructions are located on the last page of this paperwork. If you have not heard from [place] regarding the results in 2 weeks, please contact this office.

## 2017-05-10 ENCOUNTER — Other Ambulatory Visit (HOSPITAL_COMMUNITY): Payer: Self-pay | Admitting: Psychiatry

## 2017-05-10 ENCOUNTER — Other Ambulatory Visit: Payer: Self-pay | Admitting: Emergency Medicine

## 2017-05-10 DIAGNOSIS — F341 Dysthymic disorder: Secondary | ICD-10-CM

## 2017-05-10 DIAGNOSIS — J9801 Acute bronchospasm: Secondary | ICD-10-CM

## 2017-05-10 DIAGNOSIS — R05 Cough: Secondary | ICD-10-CM

## 2017-05-10 DIAGNOSIS — R059 Cough, unspecified: Secondary | ICD-10-CM

## 2017-05-10 DIAGNOSIS — F4310 Post-traumatic stress disorder, unspecified: Secondary | ICD-10-CM

## 2017-05-10 MED ORDER — ALBUTEROL SULFATE HFA 108 (90 BASE) MCG/ACT IN AERS: 1.0000 | INHALATION_SPRAY | RESPIRATORY_TRACT | 0 refills | Status: AC | PRN

## 2017-05-12 ENCOUNTER — Other Ambulatory Visit (HOSPITAL_COMMUNITY): Payer: Self-pay | Admitting: Psychiatry

## 2017-05-12 DIAGNOSIS — F4310 Post-traumatic stress disorder, unspecified: Secondary | ICD-10-CM

## 2017-05-12 DIAGNOSIS — F341 Dysthymic disorder: Secondary | ICD-10-CM

## 2017-05-16 ENCOUNTER — Ambulatory Visit (INDEPENDENT_AMBULATORY_CARE_PROVIDER_SITE_OTHER): Payer: BC Managed Care – PPO | Admitting: Family Medicine

## 2017-05-16 ENCOUNTER — Encounter: Payer: Self-pay | Admitting: Family Medicine

## 2017-05-16 VITALS — BP 116/84 | HR 110 | Temp 98.0°F | Resp 18 | Ht 64.0 in | Wt 266.0 lb

## 2017-05-16 DIAGNOSIS — M25571 Pain in right ankle and joints of right foot: Secondary | ICD-10-CM

## 2017-05-16 DIAGNOSIS — G8929 Other chronic pain: Secondary | ICD-10-CM

## 2017-05-16 DIAGNOSIS — M25562 Pain in left knee: Secondary | ICD-10-CM | POA: Diagnosis not present

## 2017-05-16 DIAGNOSIS — M25561 Pain in right knee: Secondary | ICD-10-CM

## 2017-05-16 DIAGNOSIS — M25572 Pain in left ankle and joints of left foot: Secondary | ICD-10-CM

## 2017-05-16 NOTE — Progress Notes (Signed)
Wendy Levy is a 21 y.o. female who presents to Primary Care at [facility] today for  Chief Complaint  Patient presents with  . Knee Pain    and ankle pain follow up   1.  Knee and ankle pain: Bilateral. She has had ankle pain "all her life". Her knee pain started a week ago. Tore a ligament in her left ankle 4 years ago when she hyperinverted her foot, notes that she needed physical therapy but it never really helped. Endorses a generalized pain in the knees worse with movement and standing for prolonged periods of time. The pain is worse when she is on her feet a lot. Has tried 550 mg of naproxen without any relief; she takes it only sometimes. Not worse at any particular time of day. She works as a Geologist, engineering at [employer]. At work she wears nonslip resistant shoes, the padding is worn down on the inside of the shoe without support.   ROS as above.  Pertinently, no chest pain, palpitations, SOB, Fever, Chills, Abd pain, N/V/D.   PMH reviewed. Patient is a nonsmoker.   Past Medical History:  Diagnosis Date  . ADHD (attention deficit hyperactivity disorder)   . Allergy   . Anxiety   . Depression   . Dysmenorrhea    Cramps periodically.   Marland Kitchen History of chlamydia 2016  . Oppositional defiant disorder   . Shoulder dislocation    bilateral  . Wrist fracture, bilateral    Past Surgical History:  Procedure Laterality Date  . WISDOM TOOTH EXTRACTION  04/2013    Medications reviewed. Current Outpatient Prescriptions  Medication Sig Dispense Refill  . albuterol (PROVENTIL HFA;VENTOLIN HFA) 108 (90 Base) MCG/ACT inhaler Inhale 1-2 puffs into the lungs every 4 (four) hours as needed for wheezing or shortness of breath. 1 Inhaler 0  . cetirizine (ZYRTEC) 10 MG tablet Take 10 mg by mouth daily as needed for allergies.    . hydrOXYzine (ATARAX/VISTARIL) 10 MG tablet Take 1-2 tabs po qHS prn insomnia 60 tablet 3  . lisdexamfetamine (VYVANSE) 60 MG capsule Take 1 capsule (60 mg total) by mouth  every morning. 30 capsule 0  . Multiple Vitamin (MULTIVITAMIN) capsule Take 1 capsule by mouth daily.    . naproxen sodium (ANAPROX DS) 550 MG tablet 1 tab po q 12 hours prn pain 30 tablet 2  . PARoxetine (PAXIL) 20 MG tablet Take 1 tablet (20 mg total) by mouth daily. 90 tablet 0   Current Facility-Administered Medications  Medication Dose Route Frequency Provider Last Rate Last Dose  . MedroxyPROGESTERone Acetate SUSY 150 mg  150 mg Intramuscular Q90 days Harrison Mons, PA-C   150 mg at 03/29/17 1510     Physical Exam:  BP 116/84   Pulse (!) 110   Temp 98 F (36.7 C) (Oral)   Resp 18   Ht 5\' 4"  (1.626 m)   Wt 266 lb (120.7 kg)   LMP 04/22/2017 (Approximate)   SpO2 98%   BMI 45.66 kg/m  Gen:  Alert, cooperative patient who appears stated age in no acute distress. Morbidly obese. Vital signs reviewed. HEENT: EOMI,  MMM Pulm:  Clear to auscultation bilaterally with good air movement.  No wheezes or rales noted.   Cardiac:  Regular rate and rhythm without murmur auscultated.  Good S1/S2. Abd:  Soft/nondistended/nontender.  Good bowel sounds throughout all four quadrants.  No masses noted.  Exts: Non edematous BL  LE, warm and well perfused.    Bilateral Knee:  Normal to inspection with no erythema or effusion or obvious bony abnormalities.  Palpation normal with no warmth, joint line tenderness, patellar tenderness, or condyle tenderness. ROM full in flexion and extension and lower leg rotation. Ligaments with solid consistent endpoints including ACL, PCL, LCL, MCL. Non painful patellar compression of the left but painful patellar compression of the right (positive clarks test). Patellar glide without crepitus. Patellar and quadriceps tendons unremarkable. Hamstring and quadriceps strength is normal.   Ankle & Foot: No swelling ecchymosis, erythema, ulcers, calluses, blister Arch: Normal w/o pes cavus or planus  Toes: no claw or hammer deformity  No pain at MT heads No  pain at base of 5th MT; No tenderness over cuboid; No tenderness over N spot or navicular prominence No tenderness on lateral and medial malleolus No sign of peroneal tendon subluxations or tenderness to palpation Full in plantarflexion, dorsiflexion, inversion, and eversion of the foot; flexion and extension of the toes Strength: 5/5 in all directions. Sensation: intact Vascular: intact w/ dorsalis pedis & posterior tibialis pulses 2+ Stable lateral and medial ligaments; Negative Anterior drawer test Normal gait  Assessment and Plan:  1. Bilateral knee and ankle pain: Ankle pain chronic. Knee pain x 1 week. Of note has hx of left ankle injury in 2015. No signs of infectious etiology, ligament tears, or bony abnormalities. Pain likely multifactorial from obesity and inadequate sole support in footwear. Recommended physical therapy, but patient not agreeable at this time. The plan is as follows:  - Patient will get sole inserts for her work shoes - Naproxen 550mg  BID x 7 days and then PRN - PT in the future if patient agreeable - Discuss weight loss (?weight gain from paxil) - Consider referral to orthopedic specialist if symptoms persist  Smitty Cords, MD [place], PGY-2

## 2017-05-16 NOTE — Patient Instructions (Signed)
Thank you for coming in today, it was so nice to see you! Today we talked about:    Ankle and knee pain: we suggest physical therapy which can help you. Per our conversation, if you are agreeable to trying this in the future please let us know and we will place the order. Take Naproxen twice a day x 7 days. You can use the medication you already have.   For added support, buy sole inserts for your shoes for extra cushion  Losing weight will help take some of the pressure off of your joints. For every 1 lb you lose, it takes 10 lbs of pressure off your knees.   Please follow up as needed.   Take care!   Sincerely,  Smitty Cords, MD   Knee Exercises Ask your health care provider which exercises are safe for you. Do exercises exactly as told by your health care provider and adjust them as directed. It is normal to feel mild stretching, pulling, tightness, or discomfort as you do these exercises, but you should stop right away if you feel sudden pain or your pain gets worse.Do not begin these exercises until told by your health care provider. STRETCHING AND RANGE OF MOTION EXERCISES These exercises warm up your muscles and joints and improve the movement and flexibility of your knee. These exercises also help to relieve pain, numbness, and tingling. Exercise A: Knee Extension, Prone 4. Lie on your abdomen on a bed. 5. Place your left / right knee just beyond the edge of the surface so your knee is not on the bed. You can put a towel under your left / right thigh just above your knee for comfort. 6. Relax your leg muscles and allow gravity to straighten your knee. You should feel a stretch behind your left / right knee. 7. Hold this position for __________ seconds. 8. Scoot up so your knee is supported between repetitions. Repeat __________ times. Complete this stretch __________ times a day. Exercise B: Knee Flexion, Active  1. Lie on your back with both knees straight. If this  causes back discomfort, bend your left / right knee so your foot is flat on the floor. 2. Slowly slide your left / right heel back toward your buttocks until you feel a gentle stretch in the front of your knee or thigh. 3. Hold this position for __________ seconds. 4. Slowly slide your left / right heel back to the starting position. Repeat __________ times. Complete this exercise __________ times a day. Exercise C: Quadriceps, Prone  1. Lie on your abdomen on a firm surface, such as a bed or padded floor. 2. Bend your left / right knee and hold your ankle. If you cannot reach your ankle or pant leg, loop a belt around your foot and grab the belt instead. 3. Gently pull your heel toward your buttocks. Your knee should not slide out to the side. You should feel a stretch in the front of your thigh and knee. 4. Hold this position for __________ seconds. Repeat __________ times. Complete this stretch __________ times a day. Exercise D: Hamstring, Supine 1. Lie on your back. 2. Loop a belt or towel over the ball of your left / right foot. The ball of your foot is on the walking surface, right under your toes. 3. Straighten your left / right knee and slowly pull on the belt to raise your leg until you feel a gentle stretch behind your knee. ? Do not let your left /  right knee bend while you do this. ? Keep your other leg flat on the floor. 4. Hold this position for __________ seconds. Repeat __________ times. Complete this stretch __________ times a day. STRENGTHENING EXERCISES These exercises build strength and endurance in your knee. Endurance is the ability to use your muscles for a long time, even after they get tired. Exercise E: Quadriceps, Isometric  1. Lie on your back with your left / right leg extended and your other knee bent. Put a rolled towel or small pillow under your knee if told by your health care provider. 2. Slowly tense the muscles in the front of your left / right thigh. You  should see your kneecap slide up toward your hip or see increased dimpling just above the knee. This motion will push the back of the knee toward the floor. 3. For __________ seconds, keep the muscle as tight as you can without increasing your pain. 4. Relax the muscles slowly and completely. Repeat __________ times. Complete this exercise __________ times a day. Exercise F: Straight Leg Raises - Quadriceps 1. Lie on your back with your left / right leg extended and your other knee bent. 2. Tense the muscles in the front of your left / right thigh. You should see your kneecap slide up or see increased dimpling just above the knee. Your thigh may even shake a bit. 3. Keep these muscles tight as you raise your leg 4-6 inches (10-15 cm) off the floor. Do not let your knee bend. 4. Hold this position for __________ seconds. 5. Keep these muscles tense as you lower your leg. 6. Relax your muscles slowly and completely after each repetition. Repeat __________ times. Complete this exercise __________ times a day. Exercise G: Hamstring, Isometric 1. Lie on your back on a firm surface. 2. Bend your left / right knee approximately __________ degrees. 3. Dig your left / right heel into the surface as if you are trying to pull it toward your buttocks. Tighten the muscles in the back of your thighs to dig as hard as you can without increasing any pain. 4. Hold this position for __________ seconds. 5. Release the tension gradually and allow your muscles to relax completely for __________ seconds after each repetition. Repeat __________ times. Complete this exercise __________ times a day. Exercise H: Hamstring Curls  If told by your health care provider, do this exercise while wearing ankle weights. Begin with __________ weights. Then increase the weight by 1 lb (0.5 kg) increments. Do not wear ankle weights that are more than __________. 1. Lie on your abdomen with your legs straight. 2. Bend your left /  right knee as far as you can without feeling pain. Keep your hips flat against the floor. 3. Hold this position for __________ seconds. 4. Slowly lower your leg to the starting position.  Repeat __________ times. Complete this exercise __________ times a day. Exercise I: Squats (Quadriceps) 1. Stand in front of a table, with your feet and knees pointing straight ahead. You may rest your hands on the table for balance but not for support. 2. Slowly bend your knees and lower your hips like you are going to sit in a chair. ? Keep your weight over your heels, not over your toes. ? Keep your lower legs upright so they are parallel with the table legs. ? Do not let your hips go lower than your knees. ? Do not bend lower than told by your health care provider. ? If your  knee pain increases, do not bend as low. 3. Hold the squat position for __________ seconds. 4. Slowly push with your legs to return to standing. Do not use your hands to pull yourself to standing. Repeat __________ times. Complete this exercise __________ times a day. Exercise J: Wall Slides (Quadriceps)  1. Lean your back against a smooth wall or door while you walk your feet out 18-24 inches (46-61 cm) from it. 2. Place your feet hip-width apart. 3. Slowly slide down the wall or door until your knees bend __________ degrees. Keep your knees over your heels, not over your toes. Keep your knees in line with your hips. 4. Hold for __________ seconds. Repeat __________ times. Complete this exercise __________ times a day. Exercise K: Straight Leg Raises - Hip Abductors 1. Lie on your side with your left / right leg in the top position. Lie so your head, shoulder, knee, and hip line up. You may bend your bottom knee to help you keep your balance. 2. Roll your hips slightly forward so your hips are stacked directly over each other and your left / right knee is facing forward. 3. Leading with your heel, lift your top leg 4-6 inches  (10-15 cm). You should feel the muscles in your outer hip lifting. ? Do not let your foot drift forward. ? Do not let your knee roll toward the ceiling. 4. Hold this position for __________ seconds. 5. Slowly return your leg to the starting position. 6. Let your muscles relax completely after each repetition. Repeat __________ times. Complete this exercise __________ times a day. Exercise L: Straight Leg Raises - Hip Extensors 1. Lie on your abdomen on a firm surface. You can put a pillow under your hips if that is more comfortable. 2. Tense the muscles in your buttocks and lift your left / right leg about 4-6 inches (10-15 cm). Keep your knee straight as you lift your leg. 3. Hold this position for __________ seconds. 4. Slowly lower your leg to the starting position. 5. Let your leg relax completely after each repetition. Repeat __________ times. Complete this exercise __________ times a day. This information is not intended to replace advice given to you by your health care provider. Make sure you discuss any questions you have with your health care provider. Document Released: 09/29/2005 Document Revised: 08/09/2016 Document Reviewed: 09/21/2015 Elsevier Interactive Patient Education  2018 [employer].

## 2017-05-18 ENCOUNTER — Ambulatory Visit (INDEPENDENT_AMBULATORY_CARE_PROVIDER_SITE_OTHER): Payer: BC Managed Care – PPO | Admitting: Psychology

## 2017-05-18 ENCOUNTER — Other Ambulatory Visit (HOSPITAL_COMMUNITY): Payer: Self-pay

## 2017-05-18 DIAGNOSIS — F341 Dysthymic disorder: Secondary | ICD-10-CM

## 2017-05-18 DIAGNOSIS — F902 Attention-deficit hyperactivity disorder, combined type: Secondary | ICD-10-CM | POA: Diagnosis not present

## 2017-05-18 DIAGNOSIS — F4310 Post-traumatic stress disorder, unspecified: Secondary | ICD-10-CM

## 2017-05-18 MED ORDER — PAROXETINE HCL 20 MG PO TABS: 20.0000 mg | ORAL_TABLET | Freq: Every day | ORAL | 0 refills | Status: DC

## 2017-05-18 NOTE — Progress Notes (Signed)
Patient discussed with and examined with Dr. Juanito Doom. Agree with findings, assessment and plan of care per her note.

## 2017-05-18 NOTE — Progress Notes (Signed)
   THERAPIST PROGRESS NOTE  Session Time: 2.25pm-3.pm  Participation Level: Active  Behavioral Response: Fairly GroomedAlertaffect wnl  Type of Therapy: Individual Therapy  Treatment Goals addressed: Diagnosis: Dysthymia and goal 1.  Interventions: CBT and Supportive  Summary: Wendy Levy is a 21 y.o. female who presents with affect wnl.  Pt reported she hasn't taken her Paxil in past week b/c she is out and has called to pharmacy 2 times in past 2 weeks.  Pt reported no depressed or anxious moods and still sleeping well.  Pt reported that work is going well.  Pt reported that interactions w/ mom positive.  Pt did become more anxious in session when received text from sister- giving her opinion about misunderstanding w/ mutual friend.  Pt was able to increase awareness that just difference in opinion and that malicious.  Pt also increased awareness that while she was doing in interest of someone's wellbeing not everyone is receptive to hearing someone's opinions.   Suicidal/Homicidal: Nowithout intent/plan  Therapist Response: Assessed pt current functioning per pt report.  Encouraged pt to call to office when having difficulty w/ refills in the future.  Reflected to pt change in affect and explored w/pt conflict.  Processed w/pt difference in opinions and how not all friends want recommendations from friends.   Plan: Return again in 2 weeks.  Diagnosis: Dysthymia    Zonnie Landen, LPC 05/18/2017

## 2017-05-26 ENCOUNTER — Encounter (HOSPITAL_COMMUNITY): Payer: Self-pay | Admitting: Psychiatry

## 2017-05-26 ENCOUNTER — Ambulatory Visit (INDEPENDENT_AMBULATORY_CARE_PROVIDER_SITE_OTHER): Payer: BC Managed Care – PPO | Admitting: Psychiatry

## 2017-05-26 DIAGNOSIS — F5105 Insomnia due to other mental disorder: Secondary | ICD-10-CM

## 2017-05-26 DIAGNOSIS — Z888 Allergy status to other drugs, medicaments and biological substances status: Secondary | ICD-10-CM

## 2017-05-26 DIAGNOSIS — F99 Mental disorder, not otherwise specified: Secondary | ICD-10-CM

## 2017-05-26 DIAGNOSIS — F913 Oppositional defiant disorder: Secondary | ICD-10-CM

## 2017-05-26 DIAGNOSIS — F900 Attention-deficit hyperactivity disorder, predominantly inattentive type: Secondary | ICD-10-CM

## 2017-05-26 DIAGNOSIS — F341 Dysthymic disorder: Secondary | ICD-10-CM | POA: Diagnosis not present

## 2017-05-26 DIAGNOSIS — F4310 Post-traumatic stress disorder, unspecified: Secondary | ICD-10-CM | POA: Diagnosis not present

## 2017-05-26 DIAGNOSIS — F902 Attention-deficit hyperactivity disorder, combined type: Secondary | ICD-10-CM

## 2017-05-26 DIAGNOSIS — Z818 Family history of other mental and behavioral disorders: Secondary | ICD-10-CM

## 2017-05-26 DIAGNOSIS — Z79899 Other long term (current) drug therapy: Secondary | ICD-10-CM | POA: Diagnosis not present

## 2017-05-26 DIAGNOSIS — G4700 Insomnia, unspecified: Secondary | ICD-10-CM

## 2017-05-26 MED ORDER — HYDROXYZINE HCL 25 MG PO TABS
25.0000 mg | ORAL_TABLET | Freq: Every evening | ORAL | 3 refills | Status: DC | PRN
Start: 2017-05-26 — End: 2017-08-25

## 2017-05-26 MED ORDER — LISDEXAMFETAMINE DIMESYLATE 60 MG PO CAPS: 60.0000 mg | ORAL_CAPSULE | ORAL | 0 refills | Status: DC

## 2017-05-26 MED ORDER — PAROXETINE HCL 20 MG PO TABS: 20.0000 mg | ORAL_TABLET | Freq: Every day | ORAL | 3 refills | Status: DC

## 2017-05-26 NOTE — Progress Notes (Signed)
BH MD/PA/NP OP Progress Note  05/26/2017 2:53 PM Wendy Levy  MRN:  [id number]  Chief Complaint:  Chief Complaint    Follow-up      HPI: Pt states Paxil is working well and she wants to stay on it.    Pt is working as a Radio producer at [employer]. She has been there for 7 months.   Pt states depression is getting better but she has some lingering symptoms. When she is alone she will experience a lot of worthlessness. With others she is energetic and happy. This week she expects her period to start and notes some increased irritability, some crying spells. Pt denies SI/HI.  Sleep is good with Vistaril 20mg  at bedtime. Energy is low this week.  PTSD- no symptoms.  Vyvanse is controlling her focus/concentration.   Pt had started Truvision diet pill. States her PCP said it was ok. It is designed to control her emotions and elimination. States she stopped it when she noticed she was getting depressed.   Taking meds as prescribed and denies SE.   Visit Diagnosis:    ICD-10-CM   1. PTSD (post-traumatic stress disorder) F43.10   2. 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 Attention deficit hyperactivity disorder (ADHD), predominantly inattentive type F90.0   4. Insomnia due to other mental disorder F51.05    F99       Past Psychiatric History:  Anxiety: No Bipolar Disorder: No Depression: Yes Mania: No Psychosis: No Schizophrenia: No Personality Disorder: No Hospitalization for psychiatric illness: No History of Electroconvulsive Shock Therapy: No Prior Suicide Attempts: No  Past Medical History:  Past Medical History:  Diagnosis Date  . ADHD (attention deficit hyperactivity disorder)   . Allergy   . Anxiety   . Depression   . Dysmenorrhea    Cramps periodically.   Marland Kitchen History of chlamydia 2016  . Oppositional defiant disorder   . Shoulder dislocation    bilateral  . Wrist fracture, bilateral     Past Surgical History:  Procedure Laterality Date  . WISDOM TOOTH EXTRACTION   04/2013    Family Psychiatric History: Family History  Problem Relation Age of Onset  . Adopted: Yes  . Depression Sister        reportedly in and out of treatment facilities, also dx w/ RAD  . Suicidality Sister     Social History:  Social History   Social History  . Marital status: Single    Spouse name: n/a  . Number of children: 0  . Years of education: N/A   Occupational History  . looking for work    Social History Main Topics  . Smoking status: Never Smoker  . Smokeless tobacco: Never Used  . Alcohol use No  . Drug use: No  . Sexual activity: Yes    Partners: Female    Birth control/ protection: Pill, Injection     Comment: 03/29/17   Other Topics Concern  . Not on file   Social History Narrative   Lives with her mother (adopted).  Her sister lives in [place], [place] with her own son Jamse Arn, born 12/31/2014.   Graduated from [employer]. Some classes at [employer].    Allergies:  Allergies  Allergen Reactions  . Benadryl Anti-Itch Childrens [Camphor] Rash    Rash with topical product    Metabolic Disorder Labs: Lab Results  Component Value Date   HGBA1C 5.2 02/12/2014   No results found for: PROLACTIN Lab Results  Component Value Date   CHOL  209 (H) 04/28/2016   TRIG 315 (H) 04/28/2016   HDL 51 04/28/2016   CHOLHDL 4.1 04/28/2016   VLDL 63 (H) 04/28/2016   LDLCALC 95 04/28/2016   LDLCALC 125 (H) 02/12/2014     Current Medications: Current Outpatient Prescriptions  Medication Sig Dispense Refill  . albuterol (PROVENTIL HFA;VENTOLIN HFA) 108 (90 Base) MCG/ACT inhaler Inhale 1-2 puffs into the lungs every 4 (four) hours as needed for wheezing or shortness of breath. 1 Inhaler 0  . cetirizine (ZYRTEC) 10 MG tablet Take 10 mg by mouth daily as needed for allergies.    . hydrOXYzine (ATARAX/VISTARIL) 10 MG tablet Take 1-2 tabs po qHS prn insomnia 60 tablet 3  . lisdexamfetamine (VYVANSE) 60 MG capsule Take 1 capsule (60 mg total) by mouth every  morning. 30 capsule 0  . Multiple Vitamin (MULTIVITAMIN) capsule Take 1 capsule by mouth daily.    . naproxen sodium (ANAPROX DS) 550 MG tablet 1 tab po q 12 hours prn pain 30 tablet 2  . PARoxetine (PAXIL) 20 MG tablet Take 1 tablet (20 mg total) by mouth daily. 8 tablet 0   Current Facility-Administered Medications  Medication Dose Route Frequency Provider Last Rate Last Dose  . MedroxyPROGESTERone Acetate SUSY 150 mg  150 mg Intramuscular Q90 days Harrison Mons, PA-C   150 mg at 03/29/17 1510    Musculoskeletal: Strength & Muscle Tone: within normal limits Gait & Station: normal Patient leans: N/A  Psychiatric Specialty Exam: Review of Systems  Musculoskeletal: Positive for joint pain. Negative for back pain and neck pain.  Neurological: Negative for tingling, sensory change and speech change.  Psychiatric/Behavioral: Positive for depression. Negative for hallucinations, substance abuse and suicidal ideas. The patient is not nervous/anxious and does not have insomnia.     Blood pressure 128/74, pulse 88, height 5\' 4"  (1.626 m), weight 268 lb 6.4 oz (121.7 kg).Body mass index is 46.07 kg/m.  General Appearance: Casual  Eye Contact:  Good  Speech:  Clear and Coherent and Normal Rate  Volume:  Normal  Mood:  Euthymic  Affect:  Congruent  Thought Process:  Goal Directed and Descriptions of Associations: Intact  Orientation:  Full (Time, Place, and Person)  Thought Content: Logical   Suicidal Thoughts:  No  Homicidal Thoughts:  No  Memory:  Immediate;   Good Recent;   Good Remote;   Good  Judgement:  Good  Insight:  Good  Psychomotor Activity:  Normal  Concentration:  Concentration: Good and Attention Span: Good  Recall:  Good  Fund of Knowledge: Good  Language: Good  Akathisia:  No  Handed:  Right  AIMS (if indicated):  n/a  Assets:  Communication Skills Desire for Improvement Intimacy  ADL's:  Intact  Cognition: WNL  Sleep:  good     Treatment Plan  Summary:Medication management  Assessment: ADHD- combined type; ODD; Dysthymic disorder; PTSD; Insomnia   Medication management with supportive therapy. Risks/benefits and SE of the medication discussed. Pt verbalized understanding and verbal consent obtained for treatment.  Affirm with the patient that the medications are taken as ordered. Patient expressed understanding of how their medications were to be used.   Meds: Vyvanse 60mg  po qAM for ADHD Paxil 20mg  po qD for mood and PTSD ODD- well controlled Insomnia- increase Vistaril 25mg  po qHS prn insomnia  Labs: none  Therapy: brief supportive therapy provided. Discussed psychosocial stressors in detail.    Consultations:  None   Pt denies SI and is at an acute low risk for  suicide. Patient told to call clinic if any problems occur. Patient advised to go to ER if they should develop SI/HI, side effects, or if symptoms worsen. Has crisis numbers to call if needed. Pt verbalized understanding.  F/up in 3 months or sooner if needed   [name], MD 05/26/2017, 2:53 PM

## 2017-06-28 ENCOUNTER — Ambulatory Visit (INDEPENDENT_AMBULATORY_CARE_PROVIDER_SITE_OTHER): Payer: BC Managed Care – PPO | Admitting: Psychology

## 2017-06-28 DIAGNOSIS — F341 Dysthymic disorder: Secondary | ICD-10-CM | POA: Diagnosis not present

## 2017-06-28 NOTE — Progress Notes (Signed)
   THERAPIST PROGRESS NOTE  Session Time: 3.30pm-3.57pm  Participation Level: Active  Behavioral Response: Well GroomedAlertaffect wnl  Type of Therapy: Individual Therapy  Treatment Goals addressed: Diagnosis: Dysthymia and goal 1.  Interventions: Supportive  Summary: Wendy Levy is a 21 y.o. female who presents with affect wnl.  Pt reported that she was fired from her job 2 weeks ago- after being accused of holding $10 tip monies when she forgot it was in her apron.  Pt reported that she is still able to get a job reference from her employer.  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  Pt reports that mom is constantly reminding about putting in applications.  Pt reports a little down about job loss but overall mood ok.  Pt reports that bio dad out of prison and is back local and met w him last week.  Pt reports went ok- keeping guard up until can see if trustworthy.  Pt reports she is sleeping well but still feels very fatigued.     Suicidal/Homicidal: Nowithout intent/plan  Therapist Response: Assessed pt current functioning per pt report.  Processed w/pt job loss and her steps towards further employment.  Discussed budgeting and becoming more aware in future where monies are going and how much spending money available to meet goals. .   Plan: Return again in 2 weeks.  Diagnosis: Dysthymic D/O    Jan Fireman, [facility] 06/28/2017

## 2017-07-19 ENCOUNTER — Ambulatory Visit (INDEPENDENT_AMBULATORY_CARE_PROVIDER_SITE_OTHER): Payer: BC Managed Care – PPO | Admitting: Psychology

## 2017-07-19 DIAGNOSIS — F341 Dysthymic disorder: Secondary | ICD-10-CM | POA: Diagnosis not present

## 2017-07-19 NOTE — Progress Notes (Signed)
   THERAPIST PROGRESS NOTE  Session Time: 3.30pm-4.05pm  Participation Level: Active  Behavioral Response: Fairly GroomedAlertDepressed  Type of Therapy: Individual Therapy  Treatment Goals addressed: Diagnosis: Depression and goal 1.  Interventions: CBT and Supportive  Summary: Wendy Levy is a 21 y.o. female who presents with report of feeling tired and depressed.  Pt reported that she is sleeping a decent amount, maybe to much.  Pt reports falls asleep ok- may wake w/ neighbors lights.  Pt reports her body is rested, but mind feels tired.  Pt reports that she still looking for job, had interview for help at [employer].  Pt feels interview went well, but that didn't hear back as employer stated and thinks hired someone else. Pt reports little motivation for things- wants to sleep a lot.  Pt discussed things she still enjoys and engaging in these things and looking for work that [employer] as well.     Suicidal/Homicidal: Nowithout intent/plan  Therapist Response: Assessed pt current functioning per pt report.  Processed w/pt thoughts patterns and sleep- daily activities.  Discussed ways of engaging to assist w/ mood.   Plan: Return again in 3 weeks.  Diagnosis: Dysthymic D/o     Jan Fireman, [facility] 07/19/2017

## 2017-08-18 ENCOUNTER — Ambulatory Visit (INDEPENDENT_AMBULATORY_CARE_PROVIDER_SITE_OTHER): Payer: BC Managed Care – PPO | Admitting: Psychology

## 2017-08-18 DIAGNOSIS — F341 Dysthymic disorder: Secondary | ICD-10-CM | POA: Diagnosis not present

## 2017-08-18 DIAGNOSIS — F900 Attention-deficit hyperactivity disorder, predominantly inattentive type: Secondary | ICD-10-CM | POA: Diagnosis not present

## 2017-08-18 NOTE — Progress Notes (Signed)
   THERAPIST PROGRESS NOTE  Session Time: 12:23pm-12.55pm  Participation Level: Active  Behavioral Response: Fairly GroomedAlertaffect wnl  Type of Therapy: Individual Therapy  Treatment Goals addressed: Diagnosis: Dysthymia and ADHD- goal 1.  Interventions: CBT, Solution Focused and Supportive  Summary: Rhyder Bratz is a 21 y.o. female who presents with affect wnl.  Pt reports mood has been good.  Pt reported she has been out w/ boyfriend and friend today. Pt reported that she doesn't have a job.  Pt reported needs to f/u w/ [employer].  Pt reported mom has informed if not employed by 10/1 either needs to find another place to stay or give up the car.  Pt reported that she has decided to stay w her bio father if doesn't have a job.  Pt reports has motivation w/ deadline upcoming- but still hasn't taken a lot of initiative.  Pt aware that staying w/ dad isn't best option- however wants her freedom to leave as she wants.    Suicidal/Homicidal: Nowithout intent/plan  Therapist Response: ASsessed pt current functioning per pt report. Processed w/ pt her motivation for job and taking next steps.  Discussed not creating barriers for job.   Plan: Return again in 2 weeks.  Diagnosis: dysthmia and Sandrea Hughs, [place] 08/18/2017

## 2017-08-25 ENCOUNTER — Ambulatory Visit (INDEPENDENT_AMBULATORY_CARE_PROVIDER_SITE_OTHER): Payer: BC Managed Care – PPO | Admitting: Psychiatry

## 2017-08-25 ENCOUNTER — Encounter (HOSPITAL_COMMUNITY): Payer: Self-pay | Admitting: Psychiatry

## 2017-08-25 DIAGNOSIS — F900 Attention-deficit hyperactivity disorder, predominantly inattentive type: Secondary | ICD-10-CM

## 2017-08-25 DIAGNOSIS — F913 Oppositional defiant disorder: Secondary | ICD-10-CM | POA: Diagnosis not present

## 2017-08-25 DIAGNOSIS — F99 Mental disorder, not otherwise specified: Principal | ICD-10-CM

## 2017-08-25 DIAGNOSIS — F5105 Insomnia due to other mental disorder: Secondary | ICD-10-CM

## 2017-08-25 DIAGNOSIS — F4310 Post-traumatic stress disorder, unspecified: Secondary | ICD-10-CM | POA: Diagnosis not present

## 2017-08-25 DIAGNOSIS — F341 Dysthymic disorder: Secondary | ICD-10-CM

## 2017-08-25 DIAGNOSIS — Z79899 Other long term (current) drug therapy: Secondary | ICD-10-CM | POA: Diagnosis not present

## 2017-08-25 DIAGNOSIS — Z818 Family history of other mental and behavioral disorders: Secondary | ICD-10-CM

## 2017-08-25 MED ORDER — LISDEXAMFETAMINE DIMESYLATE 60 MG PO CAPS: 60.0000 mg | ORAL_CAPSULE | ORAL | 0 refills | Status: DC

## 2017-08-25 MED ORDER — PAROXETINE HCL 30 MG PO TABS: 30.0000 mg | ORAL_TABLET | Freq: Every day | ORAL | 2 refills | Status: DC

## 2017-08-25 MED ORDER — HYDROXYZINE HCL 25 MG PO TABS
25.0000 mg | ORAL_TABLET | Freq: Every evening | ORAL | 2 refills | Status: DC | PRN
Start: 2017-08-25 — End: 2017-10-27

## 2017-08-25 NOTE — Progress Notes (Signed)
BH MD/PA/NP OP Progress Note  08/25/2017 3:02 PM Wendy Levy  MRN:  [id number]  Chief Complaint:  Chief Complaint    Follow-up     HPI: pt states her depression is still present. When alone with her boyfriend she comes like "a robot" with short responses and decrease in happiness. Pt reports she was fired for taking more than her share of tips in July. She has been feeling sad since then. Pt goes to bed and sleeps 9 hrs/night but still wakes up tired. She is tired most of the morning. Pt is socializing with friends. Pt denies feeling withdrawn. Pt denies isolation and anhedonia. Pt denies worthlessness and hopelessness. Pt denies SI/HI. Pt is looking for other work at this time.   Pt denies flashbacks and intrusive memories. She has very rare nightmares.   Anxiety is high. She often scratches herself until she bleeds due to itching.   Pt denies angry outbursts or problems socializing with others.  Pt is taking Vyvanse daily and reports no issues. She denies SE.  Pt states-taking meds as prescribed and denies SE.  Visit Diagnosis:    ICD-10-CM   1. Insomnia due to other mental disorder F51.05    F99   2. Attention deficit hyperactivity disorder (ADHD), predominantly inattentive type F90.0   3. PTSD (post-traumatic stress disorder) F43.10   4. 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 ADHD (attention deficit hyperactivity disorder)   . Allergy   . Anxiety   . Depression   . Dysmenorrhea    Cramps periodically.   Marland Kitchen History of chlamydia 2016  . Oppositional defiant disorder   . Shoulder dislocation    bilateral  . Wrist fracture, bilateral     Past  Surgical History:  Procedure Laterality Date  . WISDOM TOOTH EXTRACTION  04/2013    Family Psychiatric History:  Family History  Problem Relation Age of Onset  . Adopted: Yes  . Depression Sister        reportedly in and out of treatment facilities, also dx w/ RAD  . Suicidality Sister     Social History:  Social History   Social History  . Marital status: Single    Spouse name: n/a  . Number of children: 0  . Years of education: N/A   Occupational History  . looking for work    Social History Main Topics  . Smoking status: Never Smoker  . Smokeless tobacco: Never Used  . Alcohol use No  . Drug use: No  . Sexual activity: Yes    Partners: Female    Birth control/ protection: Pill, Injection     Comment: 03/29/17   Other Topics Concern  . None   Social History Narrative   Lives with her mother (adopted).  Her sister lives in [place], [place] with her own son Jamse Arn, born 12/31/2014.   Graduated from [employer]. Some classes at [employer].    Allergies:  Allergies  Allergen Reactions  . Benadryl Anti-Itch Childrens [Camphor] Rash    Rash with topical product    Metabolic Disorder Labs: Lab Results  Component Value Date   HGBA1C 5.2 02/12/2014   No results found for: PROLACTIN Lab Results  Component Value Date   CHOL 209 (H) 04/28/2016   TRIG 315 (  H) 04/28/2016   HDL 51 04/28/2016   CHOLHDL 4.1 04/28/2016   VLDL 63 (H) 04/28/2016   LDLCALC 95 04/28/2016   LDLCALC 125 (H) 02/12/2014   Lab Results  Component Value Date   TSH 1.44 04/28/2016   TSH 2.304 02/05/2015    Therapeutic Level Labs: No results found for: LITHIUM No results found for: VALPROATE No components found for:  CBMZ  Current Medications: Current Outpatient Prescriptions  Medication Sig Dispense Refill  . albuterol (PROVENTIL HFA;VENTOLIN HFA) 108 (90 Base) MCG/ACT inhaler Inhale 1-2 puffs into the lungs every 4 (four) hours as needed for wheezing or shortness of breath. 1  Inhaler 0  . cetirizine (ZYRTEC) 10 MG tablet Take 10 mg by mouth daily as needed for allergies.    . hydrOXYzine (ATARAX/VISTARIL) 25 MG tablet Take 1 tablet (25 mg total) by mouth at bedtime as needed (sleep). 30 tablet 3  . lisdexamfetamine (VYVANSE) 60 MG capsule Take 1 capsule (60 mg total) by mouth every morning. 30 capsule 0  . naproxen sodium (ANAPROX DS) 550 MG tablet 1 tab po q 12 hours prn pain 30 tablet 2  . PARoxetine (PAXIL) 20 MG tablet Take 1 tablet (20 mg total) by mouth daily. 30 tablet 3  . Multiple Vitamin (MULTIVITAMIN) capsule Take 1 capsule by mouth daily.     Current Facility-Administered Medications  Medication Dose Route Frequency Provider Last Rate Last Dose  . MedroxyPROGESTERone Acetate SUSY 150 mg  150 mg Intramuscular Q90 days Harrison Mons, PA-C   150 mg at 03/29/17 1510     Musculoskeletal: Strength & Muscle Tone: within normal limits Gait & Station: normal Patient leans: N/A  Psychiatric Specialty Exam: Review of Systems  Neurological: Negative for dizziness, tingling, tremors, sensory change and headaches.  Psychiatric/Behavioral: Positive for depression. Negative for hallucinations, substance abuse and suicidal ideas. The patient is nervous/anxious. The patient does not have insomnia.     Blood pressure 112/73, pulse 94, temperature 98.6 F (37 C), temperature source Oral, resp. rate 16, height 5\' 4"  (1.626 m), weight 283 lb 12.8 oz (128.7 kg), SpO2 98 %.Body mass index is 48.71 kg/m.  General Appearance: Casual  Eye Contact:  Good  Speech:  Clear and Coherent and Normal Rate  Volume:  Normal  Mood:  Depressed  Affect:  Congruent  Thought Process:  Coherent and Descriptions of Associations: Intact  Orientation:  Full (Time, Place, and Person)  Thought Content: Logical   Suicidal Thoughts:  No  Homicidal Thoughts:  No  Memory:  Immediate;   Good Recent;   Good Remote;   Good  Judgement:  Intact  Insight:  Shallow  Psychomotor Activity:   Normal  Concentration:  Concentration: Good and Attention Span: Good  Recall:  [place] of Knowledge: Good  Language: Good  Akathisia:  No  Handed:  Right  AIMS (if indicated): not done  Assets:  Communication Skills Desire for Improvement Housing  ADL's:  Intact  Cognition: WNL  Sleep:  Good   Screenings: PHQ2-9     Office Visit from confidential encounter on 05/16/2017 Office Visit from confidential encounter on 05/09/2017 Office Visit from confidential encounter on 03/29/2017 Office Visit from confidential encounter on 11/12/2016 Counselor from 07/26/2016 in [place]  PHQ-2 Total Score  0  0  0  0  0  PHQ-9 Total Score  -  -  -  -  2       Assessment and Plan:  ADHD-combined type; ODD; Dysthymic  disorder; PTSD; Insomnia   Medication management with supportive therapy. Risks/benefits and SE of the medication discussed. Pt verbalized understanding and verbal consent obtained for treatment.  Affirm with the patient that the medications are taken as ordered. Patient expressed understanding of how their medications were to be used.   Meds:  Vyvanse 60mg  po qAM for ADHD Increase Paxil 30mg  po qD for mood and PTSD ODD- well controlled Insomnia- increase Vistaril 25mg  po qHS prn insomnia    Labs: none  Therapy: brief supportive therapy provided. Discussed psychosocial stressors in detail.     Consultations: none  Pt denies SI and is at an acute low risk for suicide. Patient told to call clinic if any problems occur. Patient advised to go to ER if they should develop SI/HI, side effects, or if symptoms worsen. Has crisis numbers to call if needed. Pt verbalized understanding.  F/up in 2 months or sooner if needed   [name], MD 08/25/2017, 3:02 PM

## 2017-09-06 ENCOUNTER — Ambulatory Visit (INDEPENDENT_AMBULATORY_CARE_PROVIDER_SITE_OTHER): Payer: BC Managed Care – PPO | Admitting: Psychology

## 2017-09-06 DIAGNOSIS — F341 Dysthymic disorder: Secondary | ICD-10-CM | POA: Diagnosis not present

## 2017-09-06 DIAGNOSIS — F902 Attention-deficit hyperactivity disorder, combined type: Secondary | ICD-10-CM | POA: Diagnosis not present

## 2017-09-06 NOTE — Progress Notes (Signed)
   THERAPIST PROGRESS NOTE  Session Time:  12.35pm-1.20pm  Participation Level: Active  Behavioral Response: Well GroomedAlertaffect blunted  Type of Therapy: Family Therapy  Treatment Goals addressed: Diagnosis: Dysthymia and goal 1.  Interventions: CBT, Motivational Interviewing and Supportive  Summary: Wendy Levy is a 21 y.o. female who presents with affect blunted.  Mom accompanied pt today as agreed in contract made together after not obtaining employment by 08/29/17.  Pt car use is restricted to few things related to family, support towards employment.  Mom expressed to pt as parent want to assist pt in becoming successful/living self sufficient and has concerns that at times doesn't seem to have motivation or want for work.  Pt reported that she has been looking and putting in applications for employment and feels discouraged as no responses.  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  Mom supportive of this and makes suggestions of what next steps- those that might be contacts, [employer] program towards this trade.  Pt also discussed relationship w/ sister and wanting to help sister but feeling can't so no to sister as well.  Pt does report that she struggles w/ motivation at times.  Pt and mom report overall they have good relationship.    Suicidal/Homicidal: Nowithout intent/plan  Therapist Response: Assessed pt current functioning per pt and parent report.  Explored w/pt and parent contract, concerns- wants.  Had pt express her wants to mom and what steps she could be making towards and things she has to consider.   Plan: Return again in 2 weeks.  Diagnosis: Dysthymic D/O and ADHD    [name], LPC 09/06/2017

## 2017-09-18 ENCOUNTER — Other Ambulatory Visit (HOSPITAL_COMMUNITY): Payer: Self-pay | Admitting: Psychiatry

## 2017-09-18 DIAGNOSIS — F341 Dysthymic disorder: Secondary | ICD-10-CM

## 2017-09-18 DIAGNOSIS — F4310 Post-traumatic stress disorder, unspecified: Secondary | ICD-10-CM

## 2017-09-19 ENCOUNTER — Other Ambulatory Visit (HOSPITAL_COMMUNITY): Payer: Self-pay | Admitting: Psychiatry

## 2017-09-19 ENCOUNTER — Ambulatory Visit (INDEPENDENT_AMBULATORY_CARE_PROVIDER_SITE_OTHER): Payer: BC Managed Care – PPO | Admitting: Psychology

## 2017-09-19 DIAGNOSIS — F902 Attention-deficit hyperactivity disorder, combined type: Secondary | ICD-10-CM | POA: Diagnosis not present

## 2017-09-19 DIAGNOSIS — F5105 Insomnia due to other mental disorder: Secondary | ICD-10-CM

## 2017-09-19 DIAGNOSIS — F341 Dysthymic disorder: Secondary | ICD-10-CM | POA: Diagnosis not present

## 2017-09-19 DIAGNOSIS — F99 Mental disorder, not otherwise specified: Principal | ICD-10-CM

## 2017-09-19 DIAGNOSIS — F4310 Post-traumatic stress disorder, unspecified: Secondary | ICD-10-CM

## 2017-09-19 NOTE — Progress Notes (Signed)
   THERAPIST PROGRESS NOTE  Session Time: 10.50am-11.28am  Participation Level: Active  Behavioral Response: Well GroomedAlertDepressed  Type of Therapy: Individual Therapy  Treatment Goals addressed: Diagnosis: Dysthymia, ADHD and goal 1.  Interventions: CBT and Supportive  Summary: Wendy Levy is a 21 y.o. female who presents with affect congruent w/ report of depressed mood.  Pt had her friend join session as friend has noticed as has sister and sister's boyfriend that pt seems more depressed.  Her friend reported that she noticed pt had stopped showering and didn't seem to care.  Pt reported that she has become more aware that feeling increased depression since July- with lack of care, lack of motivation and increased negative self talk that she was worthless and didn't have a life.  Pt reports that she hasn't started on increase dose of Paxil as was trying to finish 49m. Pt reported she will start increased dose as prescribed.  Pt reported that she is sleeping better.  Pt also reported use of marijuana and aware that can impact her as well. Pt did report positive of getting call for job she applied in [employer], educational  Pt reports she needs to complete online assessment and go for interview Thursday.  Pt increased awareness of need for structure, engagement in positive activities to assist w/ reframing negative self talk.  Suicidal/Homicidal: Nowithout intent/plan  Therapist Response: Assessed pt current functioning per pt and friend report. Next met individually w/ pt and discussed that w/ increased awareness and admitting depression- can take steps towards coping.  Discussed pt negative self talk and reframing.  Discussed self care- showering, daily activities and routine, engaging in positive activities and not using drugs not prescribed and taking meds as prescribed.   Plan: Return again in 2 weeks.  Diagnosis: Dysthymia, ADHD, PtSD    Gowri Suchan, LPC 09/19/2017

## 2017-09-29 ENCOUNTER — Ambulatory Visit (INDEPENDENT_AMBULATORY_CARE_PROVIDER_SITE_OTHER): Payer: BC Managed Care – PPO | Admitting: Psychology

## 2017-09-29 DIAGNOSIS — F4310 Post-traumatic stress disorder, unspecified: Secondary | ICD-10-CM

## 2017-09-29 DIAGNOSIS — F902 Attention-deficit hyperactivity disorder, combined type: Secondary | ICD-10-CM | POA: Diagnosis not present

## 2017-09-29 DIAGNOSIS — F341 Dysthymic disorder: Secondary | ICD-10-CM | POA: Diagnosis not present

## 2017-09-29 NOTE — Progress Notes (Signed)
   THERAPIST PROGRESS NOTE  Session Time: 9.10am-9.43am  Participation Level: Active  Behavioral Response: Well GroomedAlerttired  Type of Therapy: Individual Therapy  Treatment Goals addressed: Diagnosis: Dysthymia and goal 1.  Interventions: CBT, Solution Focused and Supportive  Summary: Wendy Levy is a 21 y.o. female who presents with affect congruent w/ report of tired.  Pt reported that she went w/ her nephew trickor treating last night and got to bed a lot later than normal.  Pt reported that job opportunity didn't work out- timing wrong.  Pt reported that she doesn't get a lot of feedback from job apps and feels that due to termination at [employer]. 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  Pt reported feeling less depressed and anxious since taking meds as prescribed.  Pt reports she is bathing daily and not as many negative self talk.  Suicidal/Homicidal: Nowithout intent/plan  Therapist Response:  Assessed pt current functioning per pt report.  Processed w/ pt her mood and steps she is taking.  Explored w/pt job Secretary/administrator. Discussed what she is putting on job applications about termination and how to keep blank or more vague for chance to get in front of employer to explain. Encouraged pt to return to volunteer work w/ horses to assist in getting out house, engaging w/ something enjoys and building resume.   Plan: Return again in 2 weeks.  Diagnosis: Dysthymic d/o    YATES,LEANNE, [place] 09/29/2017

## 2017-10-27 ENCOUNTER — Ambulatory Visit (HOSPITAL_COMMUNITY): Payer: BC Managed Care – PPO | Admitting: Psychiatry

## 2017-10-27 ENCOUNTER — Encounter (HOSPITAL_COMMUNITY): Payer: Self-pay | Admitting: Psychiatry

## 2017-10-27 DIAGNOSIS — F900 Attention-deficit hyperactivity disorder, predominantly inattentive type: Secondary | ICD-10-CM | POA: Diagnosis not present

## 2017-10-27 DIAGNOSIS — Z79899 Other long term (current) drug therapy: Secondary | ICD-10-CM

## 2017-10-27 DIAGNOSIS — F17210 Nicotine dependence, cigarettes, uncomplicated: Secondary | ICD-10-CM

## 2017-10-27 DIAGNOSIS — F4310 Post-traumatic stress disorder, unspecified: Secondary | ICD-10-CM | POA: Diagnosis not present

## 2017-10-27 DIAGNOSIS — F341 Dysthymic disorder: Secondary | ICD-10-CM

## 2017-10-27 DIAGNOSIS — Z818 Family history of other mental and behavioral disorders: Secondary | ICD-10-CM

## 2017-10-27 DIAGNOSIS — F5105 Insomnia due to other mental disorder: Secondary | ICD-10-CM

## 2017-10-27 DIAGNOSIS — F99 Mental disorder, not otherwise specified: Principal | ICD-10-CM

## 2017-10-27 MED ORDER — LISDEXAMFETAMINE DIMESYLATE 60 MG PO CAPS: 60.0000 mg | ORAL_CAPSULE | ORAL | 0 refills | Status: DC

## 2017-10-27 MED ORDER — HYDROXYZINE HCL 25 MG PO TABS: 25.0000 mg | ORAL_TABLET | Freq: Every evening | ORAL | 2 refills | Status: DC | PRN

## 2017-10-27 MED ORDER — PAROXETINE HCL 40 MG PO TABS: 40.0000 mg | ORAL_TABLET | Freq: Every day | ORAL | 1 refills | Status: DC

## 2017-10-27 NOTE — Progress Notes (Signed)
BH MD/PA/NP OP Progress Note  10/27/2017 11:41 AM Wendy Levy  MRN:  [id number]  Chief Complaint:  Chief Complaint    Follow-up     HPI: Pt states she is doing well. She is now working as a Clinical biochemist person. She is working 4 days a week and she likes the job.  Pt reports no issues with ADHD and she is taking Vyvanse daily.  PTSD- she denies any nightmares, flashbacks, intrusive memories and HV.  Pt states she is has significant depression. Sleep is good. She has holding her emotions in. Pt has been having increased crying spells. Pt "blew up" one day when she her friend made a hurtful comment. She got very angry and she punched a wall. Pt is getting angry at least 2x/week. She will usually direct it towards objects but sometimes tries to use deep breathing. Pt has on/off passive thoughts of death but denies SI/HI/AVH.   Visit Diagnosis:    ICD-10-CM   1. Insomnia due to other mental disorder F51.05 hydrOXYzine (ATARAX/VISTARIL) 25 MG tablet   F99   2. Attention deficit hyperactivity disorder (ADHD), predominantly inattentive type F90.0 lisdexamfetamine (VYVANSE) 60 MG capsule    lisdexamfetamine (VYVANSE) 60 MG capsule  3. PTSD (post-traumatic stress disorder) F43.10 PARoxetine (PAXIL) 40 MG tablet  4. 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 ADHD (attention deficit hyperactivity disorder)   . Allergy   . Anxiety   . Depression   . Dysmenorrhea    Cramps periodically.   Marland Kitchen History of chlamydia 2016  . Oppositional defiant disorder   . Shoulder dislocation    bilateral  . Wrist fracture, bilateral     Past  Surgical History:  Procedure Laterality Date  . WISDOM TOOTH EXTRACTION  04/2013    Family Psychiatric History:  Family History  Adopted: Yes  Problem Relation Age of Onset  . Depression Sister        reportedly in and out of treatment facilities, also dx w/ RAD  . Suicidality Sister     Social History:  Social History   Socioeconomic History  . Marital status: Single    Spouse name: n/a  . Number of children: 0  . Years of education: None  . Highest education level: None  Social Needs  . Financial resource strain: None  . Food insecurity - worry: None  . Food insecurity - inability: None  . Transportation needs - medical: None  . Transportation needs - non-medical: None  Occupational History  . Occupation: looking for work  Tobacco Use  . Smoking status: Current Some Day Smoker    Types: Cigarettes  . Smokeless tobacco: Never Used  Substance and Sexual Activity  . Alcohol use: No    Alcohol/week: 0.0 oz  . Drug use: No  . Sexual activity: Yes    Partners: Male    Birth control/protection: Injection    Comment: 03/29/17  Other Topics Concern  . None  Social History Narrative   Lives with her mother (adopted).  Her sister lives in [place], [place] with her own son Jamse Arn, born 12/31/2014.   Graduated from [employer]. Some classes at [employer].    Allergies:  Allergies  Allergen Reactions  . Benadryl Anti-Itch Childrens [Camphor] Rash    Rash with topical product    Metabolic Disorder Labs: Lab Results  Component Value Date   HGBA1C 5.2 02/12/2014   No results found for: PROLACTIN Lab Results  Component Value Date   CHOL 209 (H) 04/28/2016   TRIG 315 (H) 04/28/2016   HDL 51 04/28/2016   CHOLHDL 4.1 04/28/2016   VLDL 63 (H) 04/28/2016   LDLCALC 95 04/28/2016   LDLCALC 125 (H) 02/12/2014   Lab Results  Component Value Date   TSH 1.44 04/28/2016   TSH 2.304 02/05/2015    Therapeutic Level Labs: No results found for: LITHIUM No results found  for: VALPROATE No components found for:  CBMZ  Current Medications: Current Outpatient Medications  Medication Sig Dispense Refill  . albuterol (PROVENTIL HFA;VENTOLIN HFA) 108 (90 Base) MCG/ACT inhaler Inhale 1-2 puffs into the lungs every 4 (four) hours as needed for wheezing or shortness of breath. 1 Inhaler 0  . cetirizine (ZYRTEC) 10 MG tablet Take 10 mg by mouth daily as needed for allergies.    . hydrOXYzine (ATARAX/VISTARIL) 25 MG tablet Take 1 tablet (25 mg total) by mouth at bedtime as needed (sleep). 30 tablet 2  . lisdexamfetamine (VYVANSE) 60 MG capsule Take 1 capsule (60 mg total) by mouth every morning. 30 capsule 0  . lisdexamfetamine (VYVANSE) 60 MG capsule Take 1 capsule (60 mg total) by mouth every morning. 30 capsule 0  . Multiple Vitamin (MULTIVITAMIN) capsule Take 1 capsule by mouth daily.    . naproxen sodium (ANAPROX DS) 550 MG tablet 1 tab po q 12 hours prn pain 30 tablet 2  . PARoxetine (PAXIL) 40 MG tablet Take 1 tablet (40 mg total) by mouth daily. 30 tablet 1   Current Facility-Administered Medications  Medication Dose Route Frequency Provider Last Rate Last Dose  . MedroxyPROGESTERone Acetate SUSY 150 mg  150 mg Intramuscular Q90 days Harrison Mons, PA-C   150 mg at 03/29/17 1510     Musculoskeletal: Strength & Muscle Tone: within normal limits Gait & Station: normal Patient leans: N/A  Psychiatric Specialty Exam: Review of Systems  Constitutional: Negative for chills and fever.  HENT: Positive for congestion. Negative for ear pain, sinus pain and sore throat.   Neurological: Negative for weakness.    Blood pressure 114/76, pulse 78, height 5\' 4"  (1.626 m), weight 278 lb (126.1 kg), SpO2 96 %.Body mass index is 47.72 kg/m.  General Appearance: Casual  Eye Contact:  Good  Speech:  Clear and Coherent and Normal Rate  Volume:  Normal  Mood:  Depressed  Affect:  Congruent  Thought Process:  Goal Directed and Descriptions of Associations: Intact   Orientation:  Full (Time, Place, and Person)  Thought Content: Logical   Suicidal Thoughts:  No  Homicidal Thoughts:  No  Memory:  Immediate;   Fair Recent;   Fair Remote;   Fair  Judgement:  Fair  Insight:  Fair  Psychomotor Activity:  Normal  Concentration:  Concentration: Good and Attention Span: Good  Recall:  Good  Fund of Knowledge: Good  Language: Good  Akathisia:  No  Handed:  Right  AIMS (if indicated): not done  Assets:  Communication Skills Desire for Improvement Housing Social Support Transportation Vocational/Educational  ADL's:  Intact  Cognition: WNL  Sleep:  Fair   Screenings: PHQ2-9     Office Visit from confidential encounter on 05/16/2017 Office Visit from confidential encounter on 05/09/2017 Office Visit from  confidential encounter on 03/29/2017 Office Visit from confidential encounter on 11/12/2016 Counselor from 07/26/2016 in [place]  PHQ-2 Total Score  0  0  0  0  0  PHQ-9 Total Score  No data  No data  No data  No data  2       Assessment and Plan: ADHD-combined type; ODD; dysthymic disorder; PTSD; insomnia    Medication management with supportive therapy. Risks/benefits and SE of the medication discussed. Pt verbalized understanding and verbal consent obtained for treatment.  Affirm with the patient that the medications are taken as ordered. Patient expressed understanding of how their medications were to be used.   Meds: Vyvanse 60 mg p.o. every morning for ADHD Increase Paxil 40 mg p.o. daily for mood and PTSD and ODD Vistaril 25 mg p.o. nightly as needed insomnia   Labs: none  Therapy: brief supportive therapy provided. Discussed psychosocial stressors in detail.     Consultations: encouraged to continue therapy Encouraged to follow up with PCP as needed  Pt denies SI and is at an acute low risk for suicide. Patient told to call clinic if any problems occur. Patient advised to go to ER if they  should develop SI/HI, side effects, or if symptoms worsen. Has crisis numbers to call if needed. Pt verbalized understanding.  F/up in 2 months or sooner if needed    [name], MD 10/27/2017, 11:41 AM

## 2017-11-01 ENCOUNTER — Ambulatory Visit (HOSPITAL_COMMUNITY): Payer: Self-pay | Admitting: Psychology

## 2017-11-02 ENCOUNTER — Ambulatory Visit: Payer: BC Managed Care – PPO | Admitting: Physician Assistant

## 2017-11-02 ENCOUNTER — Encounter: Payer: Self-pay | Admitting: Physician Assistant

## 2017-11-02 ENCOUNTER — Other Ambulatory Visit: Payer: Self-pay

## 2017-11-02 VITALS — BP 122/84 | HR 114 | Temp 98.6°F | Resp 16 | Ht 64.0 in | Wt 273.6 lb

## 2017-11-02 DIAGNOSIS — R519 Headache, unspecified: Secondary | ICD-10-CM

## 2017-11-02 DIAGNOSIS — R51 Headache: Secondary | ICD-10-CM

## 2017-11-02 DIAGNOSIS — Z118 Encounter for screening for other infectious and parasitic diseases: Secondary | ICD-10-CM | POA: Diagnosis not present

## 2017-11-02 DIAGNOSIS — Z30013 Encounter for initial prescription of injectable contraceptive: Secondary | ICD-10-CM | POA: Diagnosis not present

## 2017-11-02 DIAGNOSIS — Z3042 Encounter for surveillance of injectable contraceptive: Secondary | ICD-10-CM | POA: Insufficient documentation

## 2017-11-02 DIAGNOSIS — R42 Dizziness and giddiness: Secondary | ICD-10-CM

## 2017-11-02 LAB — POCT URINE PREGNANCY: PREG TEST UR: NEGATIVE
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
## 2017-11-02 NOTE — Patient Instructions (Signed)
     IF you received an x-ray today, you will receive an invoice from [facility]. Please contact  Radiology at [phone] with questions or concerns regarding your invoice.   IF you received labwork today, you will receive an invoice from [facility]. Please contact [facility] at [phone] with questions or concerns regarding your invoice.   Our billing staff will not be able to assist you with questions regarding bills from these companies.  You will be contacted with the lab results as soon as they are available. The fastest way to get your results is to activate your My Chart account. Instructions are located on the last page of this paperwork. If you have not heard from us regarding the results in 2 weeks, please contact this office.     

## 2017-11-03 LAB — COMPREHENSIVE METABOLIC PANEL
ALT: 50 IU/L — ABNORMAL HIGH (ref 0–32)
AST: 39 IU/L (ref 0–40)
Albumin/Globulin Ratio: 1.6 (ref 1.2–2.2)
Albumin: 4.3 g/dL (ref 3.5–5.5)
Alkaline Phosphatase: 118 IU/L — ABNORMAL HIGH (ref 39–117)
BILIRUBIN TOTAL: 0.2 mg/dL (ref 0.0–1.2)
BUN/Creatinine Ratio: 17 (ref 9–23)
BUN: 11 mg/dL (ref 6–20)
CALCIUM: 9.5 mg/dL (ref 8.7–10.2)
CHLORIDE: 104 mmol/L (ref 96–106)
CO2: 23 mmol/L (ref 20–29)
Creatinine, Ser: 0.65 mg/dL (ref 0.57–1.00)
GFR calc non Af Amer: 126 mL/min/{1.73_m2} (ref >59)
GFR, EST AFRICAN AMERICAN: 146 mL/min/{1.73_m2} (ref >59)
GLUCOSE: 99 mg/dL (ref 65–99)
Globulin, Total: 2.7 g/dL (ref 1.5–4.5)
Potassium: 4.4 mmol/L (ref 3.5–5.2)
Sodium: 140 mmol/L (ref 134–144)
TOTAL PROTEIN: 7 g/dL (ref 6.0–8.5)

## 2017-11-03 LAB — CBC WITH DIFFERENTIAL/PLATELET
BASOS: 0 %
Basophils Absolute: 0 10*3/uL (ref 0.0–0.2)
EOS (ABSOLUTE): 0.2 10*3/uL (ref 0.0–0.4)
EOS: 2 %
HEMATOCRIT: 42.3 % (ref 34.0–46.6)
Hemoglobin: 14.3 g/dL (ref 11.1–15.9)
IMMATURE GRANS (ABS): 0.1 10*3/uL (ref 0.0–0.1)
IMMATURE GRANULOCYTES: 1 %
LYMPHS: 26 %
Lymphocytes Absolute: 3.2 10*3/uL — ABNORMAL HIGH (ref 0.7–3.1)
MCH: 28.5 pg (ref 26.6–33.0)
MCHC: 33.8 g/dL (ref 31.5–35.7)
MCV: 84 fL (ref 79–97)
Monocytes Absolute: 0.8 10*3/uL (ref 0.1–0.9)
Monocytes: 6 %
NEUTROS PCT: 65 %
Neutrophils Absolute: 8 10*3/uL — ABNORMAL HIGH (ref 1.4–7.0)
Platelets: 441 10*3/uL — ABNORMAL HIGH (ref 150–379)
RBC: 5.01 x10E6/uL (ref 3.77–5.28)
RDW: 14.7 % (ref 12.3–15.4)
WBC: 12.3 10*3/uL — AB (ref 3.4–10.8)

## 2017-11-03 LAB — TSH: TSH: 2.07 u[IU]/mL (ref 0.450–4.500)

## 2017-11-04 ENCOUNTER — Encounter: Payer: Self-pay | Admitting: Physician Assistant

## 2017-11-04 ENCOUNTER — Encounter: Payer: Self-pay | Admitting: Neurology

## 2017-11-04 LAB — CHLAMYDIA/GC NAA, CONFIRMATION
CHLAMYDIA TRACHOMATIS, NAA: NEGATIVE
Neisseria gonorrhoeae, NAA: NEGATIVE

## 2017-11-05 MED ORDER — TOPIRAMATE ER 50 MG PO CAP24: 50.0000 mg | ORAL_CAPSULE | Freq: Every day | ORAL | 1 refills | Status: DC

## 2017-11-07 NOTE — Assessment & Plan Note (Signed)
Negative UCG today. Encouraged condom use to prevent STI, and to schedule her visit for the next dose to reduce likelihood of missing it again. If she does, encouraged to use condoms or abstinence until she can obtain her next dose.

## 2017-11-08 MED ORDER — TOPIRAMATE 50 MG PO TABS: 50.0000 mg | ORAL_TABLET | Freq: Every day | ORAL | 3 refills | Status: DC

## 2017-11-08 NOTE — Addendum Note (Signed)
Addended by: Fara Chute on: 11/08/2017 04:53 PM   Modules accepted: Orders

## 2017-11-16 ENCOUNTER — Ambulatory Visit (HOSPITAL_COMMUNITY): Payer: BC Managed Care – PPO | Admitting: Psychology

## 2017-11-16 DIAGNOSIS — F341 Dysthymic disorder: Secondary | ICD-10-CM | POA: Diagnosis not present

## 2017-11-16 DIAGNOSIS — F4310 Post-traumatic stress disorder, unspecified: Secondary | ICD-10-CM
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
## 2017-12-07 ENCOUNTER — Ambulatory Visit (HOSPITAL_COMMUNITY): Payer: BC Managed Care – PPO | Admitting: Psychology

## 2017-12-07 DIAGNOSIS — F341 Dysthymic disorder: Secondary | ICD-10-CM | POA: Diagnosis not present

## 2017-12-07 NOTE — Progress Notes (Signed)
   THERAPIST PROGRESS NOTE  Session Time: 10am-10.30am  Participation Level: Active  Behavioral Response: Well GroomedAlertaffect wnl  Type of Therapy: Individual Therapy  Treatment Goals addressed: Diagnosis: Dysthymia and goal 1.  Interventions: CBT and Supportive  Summary: Wendy Levy is a 22 y.o. female who presents with affect wnl.  Pt reports she has been tired lately- pt reports better since taking vistaril at bedtime again- although pt reports she wasn't having difficult falling asleep.  Pt reports headaches less w/ taking as well- she is still scheduled to see neurologist.  Pt reported she hasnt resumed relationship w/ friend that had conflict w/ and feels this is still healthy boundary.  Pt reported that she hasn't had another outburst- did get emotional when conflict w/ sister and sister stated needed a break from her.  Pt managed well.  Pt reported good interactionsw / mom and w/ family for holidays.  Pt reports still enjoying job- getting more hours.  .   Suicidal/Homicidal: Nowithout intent/plan  Therapist Response: Assessed pt current functionig per pt reprot.  Processed w/pt interactions.  Discussed w/ pt conflict and how resolved w/ sister and normalizing feelings.  Explored w/pt job and continued progress towards goals.   Plan: Return again in 2 weeks.  Diagnosis: Dysthymia  Kemal Amores, LPC 12/07/2017

## 2017-12-29 ENCOUNTER — Encounter (HOSPITAL_COMMUNITY): Payer: Self-pay | Admitting: Psychiatry

## 2017-12-29 ENCOUNTER — Ambulatory Visit (HOSPITAL_COMMUNITY): Payer: BC Managed Care – PPO | Admitting: Psychiatry

## 2017-12-29 VITALS — BP 126/78 | HR 98 | Ht 64.0 in | Wt 274.0 lb

## 2017-12-29 DIAGNOSIS — Z79899 Other long term (current) drug therapy: Secondary | ICD-10-CM

## 2017-12-29 DIAGNOSIS — R42 Dizziness and giddiness: Secondary | ICD-10-CM

## 2017-12-29 DIAGNOSIS — Z793 Long term (current) use of hormonal contraceptives: Secondary | ICD-10-CM | POA: Diagnosis not present

## 2017-12-29 DIAGNOSIS — R51 Headache: Secondary | ICD-10-CM | POA: Diagnosis not present

## 2017-12-29 DIAGNOSIS — Z818 Family history of other mental and behavioral disorders: Secondary | ICD-10-CM

## 2017-12-29 DIAGNOSIS — F5105 Insomnia due to other mental disorder: Secondary | ICD-10-CM

## 2017-12-29 DIAGNOSIS — F341 Dysthymic disorder: Secondary | ICD-10-CM | POA: Diagnosis not present

## 2017-12-29 DIAGNOSIS — F900 Attention-deficit hyperactivity disorder, predominantly inattentive type: Secondary | ICD-10-CM

## 2017-12-29 DIAGNOSIS — R4584 Anhedonia: Secondary | ICD-10-CM | POA: Diagnosis not present

## 2017-12-29 DIAGNOSIS — F4310 Post-traumatic stress disorder, unspecified: Secondary | ICD-10-CM | POA: Diagnosis not present

## 2017-12-29 DIAGNOSIS — F913 Oppositional defiant disorder: Secondary | ICD-10-CM

## 2017-12-29 DIAGNOSIS — F99 Mental disorder, not otherwise specified: Secondary | ICD-10-CM

## 2017-12-29 MED ORDER — LISDEXAMFETAMINE DIMESYLATE 60 MG PO CAPS: 60.0000 mg | ORAL_CAPSULE | ORAL | 0 refills | Status: DC

## 2017-12-29 MED ORDER — HYDROXYZINE HCL 25 MG PO TABS: 25.0000 mg | ORAL_TABLET | Freq: Every evening | ORAL | 2 refills | Status: DC | PRN

## 2017-12-29 MED ORDER — PAROXETINE HCL 40 MG PO TABS: 40.0000 mg | ORAL_TABLET | Freq: Every day | ORAL | 2 refills | Status: DC

## 2017-12-29 NOTE — Progress Notes (Signed)
[facility] MD/PA/NP OP Progress Note  12/29/2017 9:02 AM Wendy Levy  MRN:  [id number]  Chief Complaint:  Chief Complaint    Follow-up     HPI: Pt is taking a new medication for headaches. It is helping but making her extremely tired. Sleep is good and she is getting about 8 hrs/night. She is not napping during the day.   Depression is "lingering" but not as bad as before. She is "more in tune with everyone". She is isolating as much and is more social. Pt states anhedonia is improving. She denies hopelessness and SI/HI.  PTSD is mostly caused by triggers such as yelling. She thinks she may have had a flashback the other day. She can not handle it when people are yelling. She denies nightmares.  Pt is still easily frustrated but as easily as before. She is still irritable but is able to stop herself before reacting verbally and physically.  ADHD is well controlled with Vyvanse. She takes it around 11am right before work. It wears around 8pm.   Pt states-taking meds as prescribed and denies SE.   Visit Diagnosis:    ICD-10-CM   1. 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 Insomnia due to other mental disorder F51.05 hydrOXYzine (ATARAX/VISTARIL) 25 MG tablet   F99   3. Attention deficit hyperactivity disorder (ADHD), predominantly inattentive type F90.0 lisdexamfetamine (VYVANSE) 60 MG capsule    lisdexamfetamine (VYVANSE) 60 MG capsule    lisdexamfetamine (VYVANSE) 60 MG capsule  4. PTSD (post-traumatic stress disorder) F43.10 PARoxetine (PAXIL) 40 MG tablet      Past Psychiatric History:  Anxiety: No Bipolar Disorder: No Depression: Yes Mania: No Psychosis: No Schizophrenia: No Personality Disorder: No Hospitalization for psychiatric illness: No History of Electroconvulsive Shock Therapy: No Prior Suicide Attempts: No   Past Medical History:  Past Medical History:  Diagnosis Date  . ADHD (attention deficit hyperactivity disorder)   . Allergy   .  Anxiety   . Depression   . Dysmenorrhea    Cramps periodically.   Marland Kitchen History of chlamydia 2016  . Oppositional defiant disorder   . Shoulder dislocation    bilateral  . Wrist fracture, bilateral     Past Surgical History:  Procedure Laterality Date  . WISDOM TOOTH EXTRACTION  04/2013    Family Psychiatric History:  Family History  Adopted: Yes  Problem Relation Age of Onset  . Depression Sister        reportedly in and out of treatment facilities, also dx w/ RAD  . Suicidality Sister     Social History:  Social History   Socioeconomic History  . Marital status: Single    Spouse name: n/a  . Number of children: 0  . Years of education: None  . Highest education level: None  Social Needs  . Financial resource strain: Somewhat hard  . Food insecurity - worry: Never true  . Food insecurity - inability: Never true  . Transportation needs - medical: No  . Transportation needs - non-medical: No  Occupational History  . 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 Smoking status: Former Smoker    Types: Cigarettes  . Smokeless tobacco: Never Used  Substance and Sexual Activity  . Alcohol use: No    Alcohol/week: 0.0 oz  . Drug use: No  . Sexual activity: Yes    Partners: Male    Birth control/protection: Injection    Comment: 03/29/17  Other Topics Concern  . None  Social History Narrative   Lives with her mother (adopted) and significant other.  Her sister lives in [place], [place] with her own son Wendy Levy, born 12/31/2014.   Graduated from [employer].    Allergies:  Allergies  Allergen Reactions  . Benadryl Anti-Itch Childrens [Camphor] Rash    Rash with topical product    Metabolic Disorder Labs: Lab Results  Component Value Date   HGBA1C 5.2 02/12/2014   No results found for: PROLACTIN Lab Results  Component Value Date   CHOL 209 (H) 04/28/2016   TRIG 315 (H) 04/28/2016   HDL 51 04/28/2016   CHOLHDL 4.1 04/28/2016   VLDL 63  (H) 04/28/2016   LDLCALC 95 04/28/2016   LDLCALC 125 (H) 02/12/2014   Lab Results  Component Value Date   TSH 2.070 11/02/2017   TSH 1.44 04/28/2016    Therapeutic Level Labs: No results found for: LITHIUM No results found for: VALPROATE No components found for:  CBMZ  Current Medications: Current Outpatient Medications  Medication Sig Dispense Refill  . albuterol (PROVENTIL HFA;VENTOLIN HFA) 108 (90 Base) MCG/ACT inhaler Inhale 1-2 puffs into the lungs every 4 (four) hours as needed for wheezing or shortness of breath. 1 Inhaler 0  . hydrOXYzine (ATARAX/VISTARIL) 25 MG tablet Take 1 tablet (25 mg total) by mouth at bedtime as needed (sleep). 30 tablet 2  . lisdexamfetamine (VYVANSE) 60 MG capsule Take 1 capsule (60 mg total) by mouth every morning. 30 capsule 0  . lisdexamfetamine (VYVANSE) 60 MG capsule Take 1 capsule (60 mg total) by mouth every morning. 30 capsule 0  . PARoxetine (PAXIL) 40 MG tablet Take 1 tablet (40 mg total) by mouth daily. 30 tablet 1  . zonisamide (ZONEGRAN) 25 MG capsule TK 4 CS PO QD HS  1  . cetirizine (ZYRTEC) 10 MG tablet Take 10 mg by mouth daily as needed for allergies.    . Multiple Vitamin (MULTIVITAMIN) capsule Take 1 capsule by mouth daily.    . naproxen sodium (ANAPROX DS) 550 MG tablet 1 tab po q 12 hours prn pain (Patient not taking: Reported on 12/29/2017) 30 tablet 2   Current Facility-Administered Medications  Medication Dose Route Frequency Provider Last Rate Last Dose  . MedroxyPROGESTERone Acetate SUSY 150 mg  150 mg Intramuscular Q90 days Harrison Mons, PA-C   150 mg at 11/02/17 1745     Musculoskeletal: Strength & Muscle Tone: within normal limits Gait & Station: normal Patient leans: N/A  Psychiatric Specialty Exam: Review of Systems  Constitutional: Positive for malaise/fatigue. Negative for chills and fever.  Neurological: Positive for dizziness and headaches. Negative for speech change.    Blood pressure 126/78, pulse  98, height 5\' 4"  (1.626 m), weight 274 lb (124.3 kg).Body mass index is 47.03 kg/m.  General Appearance: Casual  Eye Contact:  Fair  Speech:  Clear and Coherent and Normal Rate  Volume:  Normal  Mood:  Irritable  Affect:  Congruent  Thought Process:  Goal Directed and Descriptions of Associations: Intact  Orientation:  Full (Time, Place, and Person)  Thought Content: Logical   Suicidal Thoughts:  No  Homicidal Thoughts:  No  Memory:  Immediate;   Good Recent;   Good Remote;   Good  Judgement:  Fair  Insight:  Fair  Psychomotor Activity:  Normal  Concentration:  Concentration: Good and Attention Span: Good  Recall:  Good  Fund of Knowledge: Good  Language: Good  Akathisia:  No  Handed:  Right  AIMS (if indicated):  not done  Assets:  Communication Skills Desire for Improvement Housing Intimacy Leisure Time Social Support Transportation Vocational/Educational  ADL's:  Intact  Cognition: WNL  Sleep:  Good   Screenings: PHQ2-9     Office Visit from confidential encounter on 11/02/2017 Office Visit from confidential encounter on 05/16/2017 Office Visit from confidential encounter on 05/09/2017 Office Visit from confidential encounter on 03/29/2017 Office Visit from confidential encounter on 11/12/2016  PHQ-2 Total Score  6  0  0  0  0  PHQ-9 Total Score  9  No data  No data  No data  No data       Assessment and Plan: ADHD-combined type- stable; ODD- stable; dysthymic disorder- improving; PTSD- stable; insomnia-improving    Medication management with supportive therapy. Risks/benefits and SE of the medication discussed. Pt verbalized understanding and verbal consent obtained for treatment.  Affirm with the patient that the medications are taken as ordered. Patient expressed understanding of how their medications were to be used.   The risk of un-intended pregnancy is low based on the fact that pt reports she is on Depoprovera injections. Pt is aware that these meds carry a  teratogenic risk. Pt will discuss plan of action if she does or plans to become pregnant in the future.   Meds: Vyvanse 60 mg p.o. every morning for ADHD  Paxil 40 mg p.o. daily for dysthymia, PTSD and DD Vistaril 25 mg p.o. nightly as needed insomnia   Labs: none  Therapy: brief supportive therapy provided. Discussed psychosocial stressors in detail.    Consultations:  Encouraged to follow up with therapist Encouraged to follow up with PCP as needed  Pt denies SI and is at an acute low risk for suicide. Patient told to call clinic if any problems occur. Patient advised to go to ER if they should develop SI/HI, side effects, or if symptoms worsen. Has crisis numbers to call if needed. Pt verbalized understanding.  F/up in 3 months or sooner if needed    [name], MD 12/29/2017, 9:02 AM

## 2018-01-04 ENCOUNTER — Ambulatory Visit (INDEPENDENT_AMBULATORY_CARE_PROVIDER_SITE_OTHER): Payer: BC Managed Care – PPO | Admitting: Psychology

## 2018-01-04 DIAGNOSIS — F341 Dysthymic disorder: Secondary | ICD-10-CM

## 2018-01-04 NOTE — Progress Notes (Signed)
   THERAPIST PROGRESS NOTE  Session Time: 10.55am-11.25am  Participation Level: Minimal  Behavioral Response: Fairly GroomedAlert- tired  Type of Therapy: Individual Therapy  Treatment Goals addressed: Diagnosis: dysthymia and goal 1.  Interventions: CBT and Supportive  Summary: Wendy Levy is a 22 y.o. female who presents with report of tired.  Pt reported she just woke up- pt reported she is sleeping well and feels rested- just still waking up.  Pt reported that she doesn't have a lot to talk about.  Pt reported she has been hanging w/ her boyfriend, shawn and w/ sister.  Pt reported that her mood has been good. p t reports she is working and still enjoying work. Pt reported biggest stressor is looking at renting apartment- finances to do this.  Pt reported usually brings home $500 a month, looking at renting at place that is $100 a month.  Pt lack of awareness of expenses and thinks w/ mom's assistance can do.   Suicidal/Homicidal: Nowithout intent/plan  Therapist Response: Assessed pt current functioning per pt report.  Processed w/pt mood, sleep and interactions.  Discussed w/ pt housing goals and discussed w/ pt need for other expenses and giving thoughts about how to cover.    Plan: Return again in 2 weeks.  Diagnosis: Dysthymia   Cristofher Livecchi, LPC 01/04/2018

## 2018-01-07 ENCOUNTER — Other Ambulatory Visit (HOSPITAL_COMMUNITY): Payer: Self-pay | Admitting: Psychiatry

## 2018-01-07 DIAGNOSIS — F5105 Insomnia due to other mental disorder: Secondary | ICD-10-CM

## 2018-01-07 DIAGNOSIS — F99 Mental disorder, not otherwise specified: Principal | ICD-10-CM

## 2018-01-10 ENCOUNTER — Other Ambulatory Visit (HOSPITAL_COMMUNITY): Payer: Self-pay | Admitting: Psychiatry

## 2018-01-10 DIAGNOSIS — F4310 Post-traumatic stress disorder, unspecified: Secondary | ICD-10-CM

## 2018-01-10 DIAGNOSIS — F341 Dysthymic disorder: Secondary | ICD-10-CM

## 2018-01-18 ENCOUNTER — Ambulatory Visit (HOSPITAL_COMMUNITY): Payer: BC Managed Care – PPO | Admitting: Psychology

## 2018-01-18 DIAGNOSIS — F341 Dysthymic disorder: Secondary | ICD-10-CM

## 2018-01-18 NOTE — Progress Notes (Signed)
   THERAPIST PROGRESS NOTE  Session Time: 10.55am-11.21am  Participation Level: Active  Behavioral Response: Fairly GroomedAlertAFFECT CONGRUENT W/ REPORT OF ILLNESS  Type of Therapy: Individual Therapy  Treatment Goals addressed: Diagnosis: dysthymia and goal 1.  Interventions: Solution Focused and Supportive  Summary: Wendy Levy is a 22 y.o. female who presents with affect congruent w/ report of not feeling well- having a headcold for past 1-2 weeks.  Pt reported that she has generally been sleeping well and mood good.  Pt reported not conflicts.  Pt reported that her mom has given deadline for moving out- mid march.  Pt reported that they are looking at 2 options.  Pt acknowledged that she needs to work on not spending more than intends. Pt discussed possible ways of making less accessible and better w/ budgeting.   Suicidal/Homicidal: Nowithout intent/plan  Therapist Response: assessed pt current functioning per pt report. Processed w/pt steps she is taking towards goal for moving out, barriers and changes she could make.    Plan: Return again in 3 weeks.  Diagnosis: Dysthymic d/o    YATES,LEANNE, [place] 01/18/2018

## 2018-01-23 ENCOUNTER — Encounter: Payer: Self-pay | Admitting: Physician Assistant

## 2018-01-23 ENCOUNTER — Other Ambulatory Visit (HOSPITAL_COMMUNITY): Payer: Self-pay | Admitting: Psychiatry

## 2018-01-23 DIAGNOSIS — F341 Dysthymic disorder: Secondary | ICD-10-CM

## 2018-01-23 DIAGNOSIS — F4310 Post-traumatic stress disorder, unspecified: Secondary | ICD-10-CM

## 2018-01-30 ENCOUNTER — Other Ambulatory Visit (HOSPITAL_COMMUNITY): Payer: Self-pay

## 2018-01-30 DIAGNOSIS — F341 Dysthymic disorder: Secondary | ICD-10-CM

## 2018-01-30 DIAGNOSIS — F4310 Post-traumatic stress disorder, unspecified: Secondary | ICD-10-CM

## 2018-01-30 MED ORDER — PAROXETINE HCL 40 MG PO TABS
40.0000 mg | ORAL_TABLET | Freq: Every day | ORAL | 0 refills | Status: DC
Start: 2018-01-30 — End: 2018-03-30

## 2018-01-30 MED ORDER — PAROXETINE HCL 40 MG PO TABS
40.0000 mg | ORAL_TABLET | Freq: Every day | ORAL | 0 refills | Status: DC
Start: 2018-01-30 — End: 2018-01-30

## 2018-02-01 ENCOUNTER — Ambulatory Visit (HOSPITAL_COMMUNITY): Payer: BC Managed Care – PPO | Admitting: Psychology

## 2018-02-01 ENCOUNTER — Encounter: Payer: Self-pay | Admitting: Physician Assistant

## 2018-02-01 ENCOUNTER — Ambulatory Visit: Payer: BC Managed Care – PPO | Admitting: Physician Assistant

## 2018-02-01 ENCOUNTER — Other Ambulatory Visit: Payer: Self-pay

## 2018-02-01 VITALS — BP 128/66 | HR 108 | Temp 98.5°F | Resp 16 | Ht 64.0 in | Wt 274.8 lb

## 2018-02-01 DIAGNOSIS — Z3042 Encounter for surveillance of injectable contraceptive: Secondary | ICD-10-CM | POA: Diagnosis not present

## 2018-02-01 DIAGNOSIS — F900 Attention-deficit hyperactivity disorder, predominantly inattentive type: Secondary | ICD-10-CM

## 2018-02-01 DIAGNOSIS — Z30013 Encounter for initial prescription of injectable contraceptive: Secondary | ICD-10-CM | POA: Diagnosis not present

## 2018-02-01 DIAGNOSIS — Z6841 Body Mass Index (BMI) 40.0 and over, adult: Secondary | ICD-10-CM | POA: Diagnosis not present

## 2018-02-01 DIAGNOSIS — Z118 Encounter for screening for other infectious and parasitic diseases: Secondary | ICD-10-CM

## 2018-02-01 DIAGNOSIS — R454 Irritability and anger: Secondary | ICD-10-CM

## 2018-02-01 DIAGNOSIS — L83 Acanthosis nigricans: Secondary | ICD-10-CM | POA: Diagnosis not present

## 2018-02-01 DIAGNOSIS — F341 Dysthymic disorder: Secondary | ICD-10-CM

## 2018-02-01 DIAGNOSIS — G43909 Migraine, unspecified, not intractable, without status migrainosus: Secondary | ICD-10-CM

## 2018-02-01 NOTE — Patient Instructions (Addendum)
Ask Dr. Domingo Cocking at your visit next week (02/08/2018,) about Zonisamide potentially causing agitation and memory problems.   If Dr. Domingo Cocking says that the Zonisamide may be the problem, call Dr. [name] ("Dr. Loni Muse,") and move your appointment with her up, do not wait until May to see her. She may be able to offer an alternative medication or make other helpful suggestions in this case.  IF you received an x-ray today, you will receive an invoice from [facility]. Please contact [facility] at [phone] with questions or concerns regarding your invoice.   IF you received labwork today, you will receive an invoice from [facility]. Please contact [facility] at [phone] with questions or concerns regarding your invoice.   Our billing staff will not be able to assist you with questions regarding bills from these companies.  You will be contacted with the lab results as soon as they are available. The fastest way to get your results is to activate your My Chart account. Instructions are located on the last page of this paperwork. If you have not heard from [place] regarding the results in 2 weeks, please contact this office.

## 2018-02-01 NOTE — Assessment & Plan Note (Signed)
Significant improvement with zonisamide, but experiencing increased irritability, agitation, reduced concentration/memory and perceived mental slowing. These symptoms could be due to the zonisamide, or due to lack of mood stabilizing benefit she was getting from the topiramate. Ask that she discuss this with neurology next week at her follow-up.

## 2018-02-01 NOTE — Assessment & Plan Note (Signed)
Depo-Provera injection. Repeat in 12 weeks.

## 2018-02-01 NOTE — Progress Notes (Signed)
Subjective:    Patient ID: Wendy Levy, female    DOB: [birth year], 22 y.o.   MRN: [id number]   Chief Complaint  Patient presents with  . Medication Problem    discuss confusion with meds she is taking     HPI Patient is concerned that a medication change may be causing anger outbursts. In January of this year, Dr. Domingo Cocking, of the [facility] clinic, said to stop taking Topiramate and replace it with Zonisamide (daily,) and Baclofen (prn) for management of her migraines. Patient states that the Zoniasmide PO daily does a much better job at controlling her migraines than the Topiramate did. Since this medication change, however, patient reports she is much more agitated and angrier than usual. One instance of this was Sunday, 01/22/2018. On this occasion, patient states that she "got really angry for no reason and punched a hole in the wall." Stressors in patient's life include that she has to move out of mom's house by mid-March, family issues, and the guilt of punching the wall on 01/22/2018. Patient says it may be due to her medication change (substituting Topiramate for Zonisamide and Baclofen.) Patient states it may also be due to the fact that she had an accidental break in taking her Paroxetine. Patient ran out of Paroxetine on 01/20/2018 and was not able to get it refilled and retake it until 01/30/2018. Patient states that her anger is out of control and that she is also "spacing out" while driving.    Patient sees Legrand Pitts for talk therapy every two weeks. She last saw her today (02/01/2018.) Patient's follow up with Dr. Domingo Cocking is 02/08/2018. Patient saw psychiatrist, Dr. [name], last on 12/29/2017. Her next appointment with her is on 03/30/2018.  Patient is due for her Depo-Provera shot and to be screened for chlamydia.   Patient Active Problem List   Diagnosis Date Noted  . Encounter for Depo-Provera contraception 11/02/2017  . PTSD (post-traumatic stress disorder)  12/30/2015  . BMI 40.0-44.9, adult ([place]) 02/05/2015  . Migraine, unspecified, without mention of intractable migraine without mention of status migrainosus 03/15/2013  . Major depressive disorder, recurrent episode ([place]) 01/31/2012  . ADHD (attention deficit hyperactivity disorder), combined type 11/02/2011  . ODD (oppositional defiant disorder) 11/02/2011  . Dysthymic disorder 11/02/2011   Allergies  Allergen Reactions  . Benadryl Anti-Itch Childrens [Camphor] Rash    Rash with topical product   Prior to Admission medications   Medication Sig Start Date End Date Taking? Authorizing Provider  albuterol (PROVENTIL HFA;VENTOLIN HFA) 108 (90 Base) MCG/ACT inhaler Inhale 1-2 puffs into the lungs every 4 (four) hours as needed for wheezing or shortness of breath. 05/10/17  Yes Wendie Agreste, MD  baclofen (LIORESAL) 10 MG tablet Take 10 mg by mouth 2 (two) times daily.   Yes [provider]  cetirizine (ZYRTEC) 10 MG tablet Take 10 mg by mouth daily as needed for allergies.   Yes [provider]  hydrOXYzine (ATARAX/VISTARIL) 25 MG tablet Take 1 tablet (25 mg total) by mouth at bedtime as needed (sleep). 12/29/17  Yes [name], MD  lisdexamfetamine (VYVANSE) 60 MG capsule Take 1 capsule (60 mg total) by mouth every morning. 12/29/17  Yes [name], MD  PARoxetine (PAXIL) 40 MG tablet Take 1 tablet (40 mg total) by mouth daily. 01/30/18 01/30/19 Yes [name], MD  zonisamide (ZONEGRAN) 25 MG capsule TK 4 CS PO QD HS 12/20/17  Yes [provider]  lisdexamfetamine (VYVANSE) 60 MG capsule Take  1 capsule (60 mg total) by mouth every morning. Patient not taking: Reported on 02/01/2018 12/29/17   [name], MD  lisdexamfetamine (VYVANSE) 60 MG capsule Take 1 capsule (60 mg total) by mouth every morning. Patient not taking: Reported on 02/01/2018 12/29/17   [name], MD  naproxen sodium [facility] DS) 550 MG tablet 1 tab po q 12 hours prn pain Patient not  taking: Reported on 02/01/2018 11/12/16   Harrison Mons, PA-C   Past Medical History:  Diagnosis Date  . ADHD (attention deficit hyperactivity disorder)   . Allergy   . Anxiety   . Depression   . Dysmenorrhea    Cramps periodically.   Marland Kitchen History of chlamydia 2016  . Oppositional defiant disorder   . Shoulder dislocation    bilateral  . Wrist fracture, bilateral    Social History   Socioeconomic History  . Marital status: Single    Spouse name: n/a  . Number of children: 0  . Years of education: Not on file  . Highest education level: Not on file  Social Needs  . Financial resource strain: Somewhat hard  . Food insecurity - worry: Never true  . Food insecurity - inability: Never true  . Transportation needs - medical: No  . Transportation needs - non-medical: No  Occupational History  . 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 Smoking status: Former Smoker    Types: Cigarettes  . Smokeless tobacco: Never Used  Substance and Sexual Activity  . Alcohol use: Yes    Alcohol/week: 0.0 oz    Comment: only on special occasions   . Drug use: No  . Sexual activity: Yes    Partners: Male    Birth control/protection: Injection    Comment: 03/29/17  Other Topics Concern  . Not on file  Social History Narrative   Lives with her mother (adopted) and significant other.  Her sister lives in [place], [place] with her nephew Jamse Arn, born 12/31/2014.   Graduated from [employer].   Family History  Adopted: Yes  Problem Relation Age of Onset  . Depression Sister        reportedly in and out of treatment facilities, also dx w/ RAD  . Suicidality Sister    Past Surgical History:  Procedure Laterality Date  . WISDOM TOOTH EXTRACTION  04/2013    Review of Systems  Constitutional: Negative.  Negative for activity change (Trying to walk alot more: Tries to walk every night for 30 minutes to an hour. ), appetite change and fatigue.  HENT: Negative.   Negative for congestion, rhinorrhea, sinus pressure and sinus pain.   Eyes: Negative.  Negative for photophobia (With migraine.) and pain.  Respiratory: Positive for shortness of breath (When exercising or sick. ). Negative for chest tightness.   Cardiovascular: Positive for palpitations (Occasionally.). Negative for chest pain.  Gastrointestinal: Negative.  Negative for constipation, diarrhea, nausea and vomiting.  Endocrine: Positive for polyuria. Negative for polydipsia.  Genitourinary: Negative.  Negative for difficulty urinating, dysuria and pelvic pain.  Musculoskeletal: Negative.  Negative for arthralgias and back pain.  Skin: Negative.  Negative for color change and rash.  Neurological: Positive for headaches (Migraines once or twice a week.). Negative for dizziness, light-headedness and numbness.  Psychiatric/Behavioral: Positive for agitation and suicidal ideas (No plans.). Negative for dysphoric mood, self-injury and sleep disturbance. The patient is nervous/anxious.        Objective:   Physical Exam  Constitutional: She is oriented to person,  place, and time. She appears well-developed and well-nourished.  BP 128/66   Pulse (!) 108   Temp 98.5 F (36.9 C)   Resp 16   Ht 5\' 4"  (1.626 m)   Wt 274 lb 12.8 oz (124.6 kg)   LMP 12/03/2017   SpO2 97%   BMI 47.17 kg/m   HENT:  Head: Normocephalic and atraumatic.  Right Ear: External ear normal.  Left Ear: External ear normal.  Nose: Nose normal.  Mouth/Throat: Oropharynx is clear and moist.  Eyes: Conjunctivae and EOM are normal. Pupils are equal, round, and reactive to light.  Neck: Normal range of motion. Neck supple.  Cardiovascular: Normal rate, regular rhythm, normal heart sounds and intact distal pulses.  Pulmonary/Chest: Effort normal and breath sounds normal.  Abdominal: Soft. Bowel sounds are normal.  Musculoskeletal: Normal range of motion.  Neurological: She is alert and oriented to person, place, and time. She  has normal reflexes.  Skin: Skin is warm and dry. No rash noted. No erythema. No pallor.  Acanthosis nigricans around neck.   Psychiatric: She has a normal mood and affect. Her behavior is normal. Judgment and thought content normal.   Wt Readings from Last 3 Encounters:  02/01/18 274 lb 12.8 oz (124.6 kg)  12/29/17 274 lb (124.3 kg)  11/02/17 273 lb 9.6 oz (124.1 kg)      Assessment & Plan:   1. Outbursts of anger Patient presented with concern that the medication change from Topiramate PO daily to Zonisamide PO dialy and Baclofen prn has caused an increase in her agitation and level of anger. She recently had an incident in which she became angry for no reason (01/22/2018,) and punched a hole in the wall. At this time, she had also not taken her Paroxetine since 01/20/2018. While Zonisamide works well at controlling her migraines, patient is not sure the benefit outweighs the risk if her anger is uncontrolled. Patient's next follow up with Dr. Domingo Cocking is next week Wednesday (02/08/2018.) Patient was instructed to keep taking medications as instructed by Dr. Domingo Cocking until that appointment and have him further evaluate anger and agitation in the context of the medication change. If Dr. Domingo Cocking identifies the Zonisamide as a potential problem, patient was instructed to move her follow up appointment with Dr. [name] up to the soonest available date to re-evaluate medication regime.  2. Acanthosis nigricans Patient has acanthosis nigricans, noted on PE. Patient's last CMP was on 11/02/2017 and at this time her glucose was 99 mg/dL. At this juncture, no intervention is indicated.   3. BMI 40.0-44.9, adult [facility]) Patient has begun walking 30 min- 1 hour daily recently. She has remained the same weight over the last 3 months. Patient is interested in pursuing follow up with medical weight management.  - Amb Ref to Medical Weight Management  4. Encounter for Depo-Provera contraception Patient's  Depo-Provera contraception injection was updated at this visit.   5. Screening for chlamydial disease Patient was screened for Chlamydia/Gonnorrhea at this visit. Patient will be contacted if results are positive.  - GC/Chlamydia Probe Amp  Return in about 3 months (around 05/04/2018) for Depo-Provera and re-evaluation of mood and headaches.

## 2018-02-01 NOTE — Progress Notes (Signed)
   THERAPIST PROGRESS NOTE  Session Time: 11.05am-11.43am  Participation Level: Active  Behavioral Response: Well GroomedAlertAnxious and Depressed  Type of Therapy: Individual Therapy  Treatment Goals addressed: Diagnosis: Dysthymia and goal 1.  Interventions: CBT and Supportive  Summary: Wendy Levy is a 22 y.o. female who presents with affect congruent w/ report of anxious and depressed.  Pt reported that she was w/out her Paxil for almost 2 weeks and noticed increased nightmares and some anger issues.  Pt reported that she seems to be having over reaction to small stressors.  Pt reported punching a hole in the wall at home- when angry- wasn't sure what really angry about.  Pt insight fair.  pt did report feeling really stressed- mandate to move out mid march, not getting consistent hours at work, guilt of not telling mom about boyfriend's charges, he didn't take class to dismiss charges as didn't have the money to enroll.  Pt agreed that informing mom would remove some of stress- although worried about response- aware that mom won't overreact either.  Pt reports she is meeting w/ her PCP today as feels that all her medications together might be having impact and talking over this as well.  .   Suicidal/Homicidal: Nowithout intent/plan  Therapist Response: Assessed pt current functioning per report. Processed w/pt increased irritability and snappiness.  Explored w/pt stressors and assisted in awareness of increased stress on her and being intentionally about managing stress.  Discussed benefits of communicating w/ mom and encouraged honesty.   Plan: Return again in 2 weeks.  Diagnosis: Axis I: Dysthymia and ADHD   YATES,LEANNE, LPC 02/01/2018

## 2018-02-01 NOTE — Assessment & Plan Note (Signed)
Congratulated on starting to exercise! High risk for diabetes, HTN, PCOS, etc. She is ready to implement changes.

## 2018-02-01 NOTE — Progress Notes (Signed)
Patient ID: Wendy Levy, female    DOB: 12-02-1995, 22 y.o.   MRN: [id number]  PCP: Harrison Mons, PA-C  Chief Complaint  Patient presents with  . Medication Problem    discuss confusion with meds she is taking     Subjective:   Presents for medication review. She is accompanied by her mother.  She is concerned that recent medication change may have caused anger outbursts. She was having headaches, and was referred to neurology. Dr. Domingo Cocking recommended that she stop topiramate, and replaced with zonisamide and baclofen in January. Her follow-up visit is next week.  After the change, she notes significant improvement in her headaches, but increasing irritability, anger, with outbursts, and memory problems. Reports "spacing out," arriving at destinations without remembering the drive.  In addition, was out of paroxetine from 2/22-3/04. During that time, she punched a wall at home, but doesn't recall why. Her boyfriend asked if she wanted eggs for breakfast and then he went to the bathroom. Then she punched the wall. The drywall required repair.  In addition to the medication change, and being out of paroxetine for a week, she reports several stressors: her mother has told her that she (and her boyfriend, who has been living with them for 2 years) need to find a place of their own, her father has been released from jail and is wanting to be more involved in her life and decision making than she is comfortable with, and "other family problems." She is seeing a therapist and a psychiatrist (follow-up in May) to manage those issues.  Last labs reviewed. 11/02/2017 glucose 99, TSH normal, Hgb 14.3   Review of Systems As above.    Patient Active Problem List   Diagnosis Date Noted  . Encounter for Depo-Provera contraception 11/02/2017  . PTSD (post-traumatic stress disorder) 12/30/2015  . BMI 40.0-44.9, adult ([place]) 02/05/2015  . Migraine, unspecified, without mention of  intractable migraine without mention of status migrainosus 03/15/2013  . Major depressive disorder, recurrent episode ([place]) 01/31/2012  . ADHD (attention deficit hyperactivity disorder), combined type 11/02/2011  . ODD (oppositional defiant disorder) 11/02/2011  . Dysthymic disorder 11/02/2011     Prior to Admission medications   Medication Sig Start Date End Date Taking? Authorizing Provider  albuterol (PROVENTIL HFA;VENTOLIN HFA) 108 (90 Base) MCG/ACT inhaler Inhale 1-2 puffs into the lungs every 4 (four) hours as needed for wheezing or shortness of breath. 05/10/17   Wendie Agreste, MD  cetirizine (ZYRTEC) 10 MG tablet Take 10 mg by mouth daily as needed for allergies.    [provider]  hydrOXYzine (ATARAX/VISTARIL) 25 MG tablet Take 1 tablet (25 mg total) by mouth at bedtime as needed (sleep). 12/29/17   [name], MD  lisdexamfetamine (VYVANSE) 60 MG capsule Take 1 capsule (60 mg total) by mouth every morning. 12/29/17   [name], MD  lisdexamfetamine (VYVANSE) 60 MG capsule Take 1 capsule (60 mg total) by mouth every morning. 12/29/17   [name], MD  lisdexamfetamine (VYVANSE) 60 MG capsule Take 1 capsule (60 mg total) by mouth every morning. 12/29/17   [name], MD  Multiple Vitamin (MULTIVITAMIN) capsule Take 1 capsule by mouth daily.    [provider]  naproxen sodium (ANAPROX DS) 550 MG tablet 1 tab po q 12 hours prn pain Patient not taking: Reported on 12/29/2017 11/12/16   Harrison Mons, PA-C  PARoxetine (PAXIL) 40 MG tablet Take 1 tablet (40 mg total) by mouth daily. 01/30/18 01/30/19  [name], MD  zonisamide (ZONEGRAN) 25 MG capsule TK 4 CS PO QD HS 12/20/17   [provider]     Allergies  Allergen Reactions  . Benadryl Anti-Itch Childrens [Camphor] Rash    Rash with topical product       Objective:  Physical Exam  Constitutional: She is oriented to person, place, and time. She appears well-developed and  well-nourished. She is active and cooperative. No distress.  BP 128/66   Pulse (!) 108   Temp 98.5 F (36.9 C)   Resp 16   Ht 5\' 4"  (1.626 m)   Wt 274 lb 12.8 oz (124.6 kg)   LMP 12/03/2017   SpO2 97%   BMI 47.17 kg/m   HENT:  Head: Normocephalic and atraumatic.  Right Ear: Hearing normal.  Left Ear: Hearing normal.  Eyes: Conjunctivae are normal. No scleral icterus.  Neck: Normal range of motion. Neck supple. No thyromegaly present.  Cardiovascular: Normal rate, regular rhythm and normal heart sounds.  Pulses:      Radial pulses are 2+ on the right side, and 2+ on the left side.  Pulmonary/Chest: Effort normal and breath sounds normal.  Lymphadenopathy:       Head (right side): No tonsillar, no preauricular, no posterior auricular and no occipital adenopathy present.       Head (left side): No tonsillar, no preauricular, no posterior auricular and no occipital adenopathy present.    She has no cervical adenopathy.       Right: No supraclavicular adenopathy present.       Left: No supraclavicular adenopathy present.  Neurological: She is alert and oriented to person, place, and time. No sensory deficit.  Skin: Skin is warm, dry and intact. Rash (acanthosis nigricans) noted. No cyanosis or erythema. Nails show no clubbing.  Psychiatric: She has a normal mood and affect. Her speech is normal and behavior is normal.       Assessment & Plan:   Problem List Items Addressed This Visit    Migraine headache    Significant improvement with zonisamide, but experiencing increased irritability, agitation, reduced concentration/memory and perceived mental slowing. These symptoms could be due to the zonisamide, or due to lack of mood stabilizing benefit she was getting from the topiramate. Ask that she discuss this with neurology next week at her follow-up.      Relevant Medications   baclofen (LIORESAL) 10 MG tablet   BMI 40.0-44.9, adult ([place])    Congratulated on starting to exercise!  High risk for diabetes, HTN, PCOS, etc. She is ready to implement changes.       Relevant Orders   Amb Ref to Medical Weight Management   Encounter for Depo-Provera contraception    Depo-Provera injection. Repeat in 12 weeks.       Other Visit Diagnoses    Outbursts of anger    -  Primary   Zonisamide vs lack of mood stabilization? Discuss with neurology, then psychiatry, if needed.   Acanthosis nigricans       Glucose 99 10/2017. Refer to medical weight management.   Screening for chlamydial disease       Relevant Orders   GC/Chlamydia Probe Amp       Return in about 3 months (around 05/04/2018) for Depo-Provera and re-evaluation of mood and headaches.   Fara Chute, PA-C [facility]

## 2018-02-02 NOTE — Progress Notes (Signed)
Lets see if Roshonda can come in next week with me

## 2018-02-16 ENCOUNTER — Ambulatory Visit (HOSPITAL_COMMUNITY): Payer: BC Managed Care – PPO | Admitting: Psychology

## 2018-02-16 DIAGNOSIS — F341 Dysthymic disorder: Secondary | ICD-10-CM | POA: Diagnosis not present

## 2018-02-16 NOTE — Progress Notes (Signed)
   THERAPIST PROGRESS NOTE  Session Time: 1pm-1.27pm  Participation Level: Active  Behavioral Response: Well GroomedAlertaffect wnl  Type of Therapy: Individual Therapy  Treatment Goals addressed: Diagnosis: Dysthymia and goal1.   Interventions: CBT and Solution Focused  Summary: [name] is a 22 y.o. female who presents with affect wnl.  Pt came in w/ new tattoo that received this weekend- boyfriend's art. Pt reported that they moved out 1.5 weeks ago and currently staying in hotel paid for 1 month.  Pt reported that she hasn't received any more hours at work- but still asking about.  Pt reported one location closed and now sharing w/ other workers.  Pt reports that she didn't communicated stressors facing to mom and is trying not thinking about potential outcomes of boyfriend court date. Pt did report that she doesn't feel stressed since moving out and mood has improved.  Pt poor insight into planning ahead but feels that they are budgeting better than expected.  Pt did acknowledged that 2nd job might be option for more hours.    Suicidal/Homicidal: Nowithout intent/plan  Therapist Response: Assessed pt current functioning per pt report. Processed w/pt mood and contributing factors to improvement.  Discussed need to think beyond this month and plans for housing, finances and exploring options for more work hours.    Plan: Return again in 2 weeks.  Diagnosis: Dysthymic d/o   YATES,LEANNE, [facility] 02/16/2018

## 2018-02-20 ENCOUNTER — Encounter: Payer: Self-pay | Admitting: Physician Assistant

## 2018-02-22 ENCOUNTER — Ambulatory Visit: Payer: BC Managed Care – PPO | Admitting: Neurology

## 2018-03-02 ENCOUNTER — Ambulatory Visit (HOSPITAL_COMMUNITY): Payer: BC Managed Care – PPO | Admitting: Psychology

## 2018-03-02 ENCOUNTER — Encounter: Payer: Self-pay | Admitting: Physician Assistant

## 2018-03-22 ENCOUNTER — Ambulatory Visit (INDEPENDENT_AMBULATORY_CARE_PROVIDER_SITE_OTHER): Payer: BC Managed Care – PPO | Admitting: Psychology

## 2018-03-22 DIAGNOSIS — F341 Dysthymic disorder: Secondary | ICD-10-CM

## 2018-03-22 NOTE — Progress Notes (Signed)
   THERAPIST PROGRESS NOTE  Session Time: 11.10am-11.45am  Participation Level: Active  Behavioral Response: Fairly GroomedAlerttired- mood good  Type of Therapy: Individual Therapy  Treatment Goals addressed: Diagnosis: dysthymia and goal 1.  Interventions: Solution Focused and Supportive  Summary: Wendy Levy is a 22 y.o. female who presents with affect wnl.  Pt reports she is tired.  Pt reports she is sleeping but still wakes and feels tired. Pt reports not feeling stressed, no depressed mood.  Pt reports she is working still but reduced hours to about 11 a week.  Pt is applying and looking for more hours elsewhere. Pt reports she sees her sister about daily and mom about 1x  A week.  pt reports she has stopped marijuana use for past month.  Pt reported that she is considering returning to [facility] to complete her math class.  Pt reports she is having daily head aches again- f/u w/ headache clinic in 2 weeks.  Pt agrees to f/u w/ PCP about sleep..   Suicidal/Homicidal: Nowithout intent/plan  Therapist Response: Assessed pt current functioning per pt rpeort.  Processed w/pt her fatigue and explored if related to emotions- stress. Encouraged to f/u w/ PCP re: Marland Kitchen  Explored w/pt how maintaining w/ living on own w/ boyfriend and how plans to maintain- options for increased income and steps to make .  Plan: Return again in 3 weeks.  Diagnosis: Dysthymia    YATES,LEANNE, LPC 03/22/2018

## 2018-03-30 ENCOUNTER — Ambulatory Visit (HOSPITAL_COMMUNITY): Payer: BC Managed Care – PPO | Admitting: Psychiatry

## 2018-03-30 ENCOUNTER — Encounter (HOSPITAL_COMMUNITY): Payer: Self-pay | Admitting: Psychiatry

## 2018-03-30 DIAGNOSIS — Z818 Family history of other mental and behavioral disorders: Secondary | ICD-10-CM

## 2018-03-30 DIAGNOSIS — F341 Dysthymic disorder: Secondary | ICD-10-CM

## 2018-03-30 DIAGNOSIS — R0683 Snoring: Secondary | ICD-10-CM

## 2018-03-30 DIAGNOSIS — F900 Attention-deficit hyperactivity disorder, predominantly inattentive type: Secondary | ICD-10-CM | POA: Diagnosis not present

## 2018-03-30 DIAGNOSIS — F99 Mental disorder, not otherwise specified: Secondary | ICD-10-CM

## 2018-03-30 DIAGNOSIS — F4310 Post-traumatic stress disorder, unspecified: Secondary | ICD-10-CM

## 2018-03-30 DIAGNOSIS — R51 Headache: Secondary | ICD-10-CM | POA: Diagnosis not present

## 2018-03-30 DIAGNOSIS — F5105 Insomnia due to other mental disorder: Secondary | ICD-10-CM | POA: Diagnosis not present

## 2018-03-30 DIAGNOSIS — Z87891 Personal history of nicotine dependence: Secondary | ICD-10-CM

## 2018-03-30 MED ORDER — LISDEXAMFETAMINE DIMESYLATE 60 MG PO CAPS: 60.0000 mg | ORAL_CAPSULE | ORAL | 0 refills | Status: DC

## 2018-03-30 MED ORDER — HYDROXYZINE HCL 25 MG PO TABS: 25.0000 mg | ORAL_TABLET | Freq: Every evening | ORAL | 0 refills | Status: DC | PRN

## 2018-03-30 MED ORDER — PAROXETINE HCL 40 MG PO TABS: 40.0000 mg | ORAL_TABLET | Freq: Every day | ORAL | 0 refills | Status: DC

## 2018-03-30 NOTE — Progress Notes (Signed)
BH MD/PA/NP OP Progress Note  03/30/2018 8:59 AM Wendy Levy  MRN:  [id number]  Chief Complaint:  Chief Complaint    Follow-up     HPI: Pt states she is doing well. She is now working 5 days a week. Her ADHD is well controlled with Vyvanse.  Pt has anxiety when not busy. She treats by distracting herself and that helps.  Pt is sleeping about 10 hrs/night with Vistaril. Pt wakes feeling un-rested and tired and with a headache. She is snoring and wakes up multiple times a night feeling like she can't breath.   Pt has a low level of depression. She tries not to focus on it. Pt denies SI/HI.   Pt denies any recent flashbacks or nightmares. She ran out of Paxil for one week in March and had an increase in nightmares at that time.   Pt states-taking meds as prescribed and denies SE.    Visit Diagnosis:    ICD-10-CM   1. Insomnia due to other mental disorder F51.05 Ambulatory referral to Sleep Studies   F99 hydrOXYzine (ATARAX/VISTARIL) 25 MG tablet  2. Attention deficit hyperactivity disorder (ADHD), predominantly inattentive type F90.0 lisdexamfetamine (VYVANSE) 60 MG capsule    lisdexamfetamine (VYVANSE) 60 MG capsule    lisdexamfetamine (VYVANSE) 60 MG capsule  3. PTSD (post-traumatic stress disorder) F43.10 PARoxetine (PAXIL) 40 MG tablet  4. 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 ADHD (attention deficit hyperactivity disorder)   . Allergy   . Anxiety   . Depression   . Dysmenorrhea    Cramps periodically.   Marland Kitchen History of chlamydia 2016  . Oppositional defiant disorder   . Shoulder dislocation    bilateral  .  Wrist fracture, bilateral     Past Surgical History:  Procedure Laterality Date  . WISDOM TOOTH EXTRACTION  04/2013    Family Psychiatric History:  Family History  Adopted: Yes  Problem Relation Age of Onset  . Depression Sister        reportedly in and out of treatment facilities, also dx w/ RAD  . Suicidality Sister     Social History:  Social History   Socioeconomic History  . Marital status: Single    Spouse name: n/a  . Number of children: 0  . Years of education: Not on file  . Highest education level: Not on file  Occupational History  . Occupation: Education officer, community for [employer]  . Financial resource strain: Somewhat hard  . Food insecurity:    Worry: Never true    Inability: Never true  . Transportation needs:    Medical: No    Non-medical: No  Tobacco Use  . Smoking status: Former Smoker    Types: Cigarettes  . Smokeless tobacco: Never Used  Substance and Sexual Activity  . Alcohol use: Yes    Alcohol/week: 0.0 oz    Comment: only on special occasions   . Drug use: No  . Sexual activity: Yes    Partners: Male    Birth control/protection: Injection    Comment: 03/29/17  Lifestyle  . Physical activity:    Days per week: 0 days    Minutes per session:  0 min  . Stress: Very much  Relationships  . Social connections:    Talks on phone: Once a week    Gets together: More than three times a week    Attends religious service: 1 to 4 times per year    Active member of club or organization: No    Attends meetings of clubs or organizations: Never    Relationship status: Never married  Other Topics Concern  . Not on file  Social History Narrative   Lives with her mother (adopted) and significant other.  Her sister lives in [place], [place] with her nephew Jamse Arn, born 12/31/2014.   Graduated from [employer].    Allergies:  Allergies  Allergen Reactions  . Benadryl Anti-Itch Childrens [Camphor] Rash    Rash with topical  product    Metabolic Disorder Labs: Lab Results  Component Value Date   HGBA1C 5.2 02/12/2014   No results found for: PROLACTIN Lab Results  Component Value Date   CHOL 209 (H) 04/28/2016   TRIG 315 (H) 04/28/2016   HDL 51 04/28/2016   CHOLHDL 4.1 04/28/2016   VLDL 63 (H) 04/28/2016   LDLCALC 95 04/28/2016   LDLCALC 125 (H) 02/12/2014   Lab Results  Component Value Date   TSH 2.070 11/02/2017   TSH 1.44 04/28/2016    Therapeutic Level Labs: No results found for: LITHIUM No results found for: VALPROATE No components found for:  CBMZ  Current Medications: Current Outpatient Medications  Medication Sig Dispense Refill  . albuterol (PROVENTIL HFA;VENTOLIN HFA) 108 (90 Base) MCG/ACT inhaler Inhale 1-2 puffs into the lungs every 4 (four) hours as needed for wheezing or shortness of breath. 1 Inhaler 0  . baclofen (LIORESAL) 10 MG tablet Take 10 mg by mouth 2 (two) times daily.    . cetirizine (ZYRTEC) 10 MG tablet Take 10 mg by mouth daily as needed for allergies.    . hydrOXYzine (ATARAX/VISTARIL) 25 MG tablet Take 1 tablet (25 mg total) by mouth at bedtime as needed (sleep). 90 tablet 0  . lisdexamfetamine (VYVANSE) 60 MG capsule Take 1 capsule (60 mg total) by mouth every morning. 30 capsule 0  . PARoxetine (PAXIL) 40 MG tablet Take 1 tablet (40 mg total) by mouth daily. 90 tablet 0  . zonisamide (ZONEGRAN) 25 MG capsule TK 4 CS PO QD HS  1  . lisdexamfetamine (VYVANSE) 60 MG capsule Take 1 capsule (60 mg total) by mouth every morning. 30 capsule 0  . lisdexamfetamine (VYVANSE) 60 MG capsule Take 1 capsule (60 mg total) by mouth every morning. 30 capsule 0  . naproxen sodium (ANAPROX DS) 550 MG tablet 1 tab po q 12 hours prn pain (Patient not taking: Reported on 02/01/2018) 30 tablet 2   No current facility-administered medications for this visit.      Musculoskeletal: Strength & Muscle Tone: within normal limits Gait & Station: normal Patient leans: N/A  Psychiatric  Specialty Exam: Review of Systems  Constitutional: Positive for malaise/fatigue. Negative for chills and fever.  Neurological: Positive for headaches. Negative for dizziness, tremors and speech change.    Blood pressure 102/68, pulse 88, height 5\' 4"  (1.626 m), weight 286 lb 3.2 oz (129.8 kg).Body mass index is 49.13 kg/m.  General Appearance: Casual and malodorous  Eye Contact:  Good  Speech:  Clear and Coherent and Normal Rate  Volume:  Normal  Mood:  Anxious and Depressed  Affect:  Congruent  Thought Process:  Goal Directed and Descriptions of Associations: Intact  Orientation:  Full (Time, Place, and Person)  Thought Content: Logical   Suicidal Thoughts:  No  Homicidal Thoughts:  No  Memory:  Immediate;   Fair Recent;   Fair Remote;   Fair  Judgement:  Good  Insight:  Good  Psychomotor Activity:  Restlessness  Concentration:  Concentration: Fair and Attention Span: Fair  Recall:  [employer] of Knowledge: Fair  Language: Fair  Akathisia:  No  Handed:  Right  AIMS (if indicated): not done  Assets:  Desire for Improvement Housing Leisure Time Transportation Vocational/Educational  ADL's:  Intact  Cognition: WNL  Sleep:  Poor   Screenings: PHQ2-9     Office Visit from confidential encounter on 11/02/2017 Office Visit from confidential encounter on 05/16/2017 Office Visit from confidential encounter on 05/09/2017 Office Visit from confidential encounter on 03/29/2017 Office Visit from confidential encounter on 11/12/2016  PHQ-2 Total Score  6  0  0  0  0  PHQ-9 Total Score  9  -  -  -  -      I reviewed the information below on Mar 30, 2018 and agree except where noted/changed Assessment and Plan: ADHD-combined type; ODD; dysthymic disorder; PTSD; insomnia    Medication management with supportive therapy. Risks/benefits and SE of the medication discussed. Pt verbalized understanding and verbal consent obtained for treatment.  Affirm with the patient that the medications  are taken as ordered. Patient expressed understanding of how their medications were to be used.   The risk of un-intended pregnancy is low based on the fact that pt reports she is on Depoprovera injections. Pt is aware that these meds carry a teratogenic risk. Pt will discuss plan of action if she does or plans to become pregnant in the future.   Meds: Vyvanse 60 mg p.o. every morning for ADHD  Paxil 40 mg p.o. daily for dysthymia, PTSD and ODD Vistaril 25 mg p.o. nightly as needed insomnia   Labs: none  Therapy: brief supportive therapy provided. Discussed psychosocial stressors in detail.    Consultations: referred for sleep study for possible sleep apnea Encouraged to follow up with therapist Encouraged to follow up with PCP as needed  Pt denies SI and is at an acute low risk for suicide. Patient told to call clinic if any problems occur. Patient advised to go to ER if they should develop SI/HI, side effects, or if symptoms worsen. Has crisis numbers to call if needed. Pt verbalized understanding.  F/up in 3 months or sooner if needed    [name], MD 03/30/2018, 8:59 AM

## 2018-04-26 ENCOUNTER — Ambulatory Visit (HOSPITAL_COMMUNITY): Payer: BC Managed Care – PPO | Admitting: Psychology

## 2018-04-26 ENCOUNTER — Other Ambulatory Visit (HOSPITAL_COMMUNITY): Payer: Self-pay

## 2018-04-26 DIAGNOSIS — F5105 Insomnia due to other mental disorder: Secondary | ICD-10-CM

## 2018-04-26 DIAGNOSIS — F900 Attention-deficit hyperactivity disorder, predominantly inattentive type: Secondary | ICD-10-CM | POA: Diagnosis not present

## 2018-04-26 DIAGNOSIS — F341 Dysthymic disorder: Secondary | ICD-10-CM

## 2018-04-26 DIAGNOSIS — F99 Mental disorder, not otherwise specified: Principal | ICD-10-CM

## 2018-04-26 NOTE — Progress Notes (Signed)
   THERAPIST PROGRESS NOTE  Session Time: 10.55am-11.30am  Participation Level: Active  Behavioral Response: Fairly GroomedAlertaffect wnl  Type of Therapy: Individual Therapy  Treatment Goals addressed: Diagnosis: dysthymia and goal 1.  Interventions: CBT and Supportive  Summary: Wendy Levy is a 22 y.o. female who presents with affect wnl.  Pt reported she wants to talk w/ nurse today- one to f/u on sleep study referral as hasn't heard anything and two experiencing some dizziness when turing head.  Pt reported work is going well as getting around 20-25 hours.  Pt reported that on June 11 she will be moving back home- but her boyfriend is not allowed to return.  Pt reported she doesn't like but aware mom's option.  Pt reported that she is considering returning to school to complete math remediation class- pt discussed next steps to f/u on.  Pt expressed interactions w/ father and feeling hurt as connects w/ her sister but not her.  Pt increased awareness that he has never been consistent positive for her and that perhaps she is expecting something not able to give.     Suicidal/Homicidal: Nowithout intent/plan  Therapist Response: Assessed pt current functioning per pt report. Processed w/pt transition back home- discussed budgeting and next steps if attempting to budget.  Explored w/pt interactions w/ dad and hx and expectations vs. Reality.  Plan: Return again in 3 weeks.  Diagnosis: Dysthymia   Taelor Moncada, LPC 04/26/2018

## 2018-05-06 ENCOUNTER — Encounter: Payer: Self-pay | Admitting: Physician Assistant

## 2018-05-13 LAB — GLUCOSE, POCT (MANUAL RESULT ENTRY): POC GLUCOSE: 138 mg/dL — AB (ref 70–99)

## 2018-05-18 ENCOUNTER — Encounter: Payer: Self-pay | Admitting: Family Medicine

## 2018-05-18 ENCOUNTER — Other Ambulatory Visit: Payer: Self-pay

## 2018-05-18 ENCOUNTER — Ambulatory Visit: Payer: BLUE CROSS/BLUE SHIELD | Admitting: Family Medicine

## 2018-05-18 VITALS — BP 127/80 | HR 86 | Temp 98.5°F | Ht 64.0 in | Wt 290.0 lb

## 2018-05-18 DIAGNOSIS — Z113 Encounter for screening for infections with a predominantly sexual mode of transmission: Secondary | ICD-10-CM

## 2018-05-18 DIAGNOSIS — Z3042 Encounter for surveillance of injectable contraceptive: Secondary | ICD-10-CM

## 2018-05-18 LAB — POCT URINE PREGNANCY: PREG TEST UR: NEGATIVE

## 2018-05-18 MED ORDER — MEDROXYPROGESTERONE ACETATE 150 MG/ML IM SUSY
150.0000 mg | PREFILLED_SYRINGE | Freq: Once | INTRAMUSCULAR | Status: AC
  Administered 2018-05-18: 150 mg via INTRAMUSCULAR

## 2018-05-18 NOTE — Progress Notes (Signed)
Subjective:  By signing my name below, I, Moises Blood, attest that this documentation has been prepared under the direction and in the presence of Merri Ray, MD. Electronically Signed: Moises Blood, [place]. 05/18/2018 , 11:48 AM .  Patient was seen in Room 8 .   Patient ID: Wendy Levy, female    DOB: 01-16-1996, 22 y.o.   MRN: [id number] Chief Complaint  Patient presents with  . depo shot    getting it for a year   HPI Wendy Levy is a 22 y.o. female Here for discussion of contraception. She's used Depo-Provera previously, most recently on March 6th with repeat interval of May 22nd through June 5th. She was last seen 3 months ago by her PCP on 02/01/18; did note acanthosis nigricans and history of obesity. Glucose was 99 in December, which was also normal 2 years ago.   Patient denies any breakthrough bleeding recently; has had bleeding every now and then. Last sexual activity, unprotected 2 days ago. She also requests chlamydia testing today; no history of syphilis or HIV. Last syphilis and HIV testing done in 2016. Receiving Depo-Provera injection today, means she will be due between Sept 5th through Sept 19th.   She also mentions her ears feeling full, and has been asking people to repeat themselves.   Patient Active Problem List   Diagnosis Date Noted  . Encounter for Depo-Provera contraception 11/02/2017  . PTSD (post-traumatic stress disorder) 12/30/2015  . BMI 40.0-44.9, adult ([place]) 02/05/2015  . Migraine headache 03/15/2013  . Major depressive disorder, recurrent episode ([place]) 01/31/2012  . ADHD (attention deficit hyperactivity disorder), combined type 11/02/2011  . ODD (oppositional defiant disorder) 11/02/2011  . Dysthymic disorder 11/02/2011   Past Medical History:  Diagnosis Date  . ADHD (attention deficit hyperactivity disorder)   . Allergy   . Anxiety   . Depression   . Dysmenorrhea    Cramps periodically.   Marland Kitchen History of chlamydia 2016  .  Oppositional defiant disorder   . Shoulder dislocation    bilateral  . Wrist fracture, bilateral    Past Surgical History:  Procedure Laterality Date  . WISDOM TOOTH EXTRACTION  04/2013   Allergies  Allergen Reactions  . Benadryl Anti-Itch Childrens [Camphor] Rash    Rash with topical product   Prior to Admission medications   Medication Sig Start Date End Date Taking? Authorizing Provider  albuterol (PROVENTIL HFA;VENTOLIN HFA) 108 (90 Base) MCG/ACT inhaler Inhale 1-2 puffs into the lungs every 4 (four) hours as needed for wheezing or shortness of breath. 05/10/17  Yes Wendie Agreste, MD  baclofen (LIORESAL) 10 MG tablet Take 10 mg by mouth 2 (two) times daily.   Yes [provider]  cetirizine (ZYRTEC) 10 MG tablet Take 10 mg by mouth daily as needed for allergies.   Yes [provider]  hydrOXYzine (ATARAX/VISTARIL) 25 MG tablet Take 1 tablet (25 mg total) by mouth at bedtime as needed (sleep). 03/30/18  Yes [name], MD  lisdexamfetamine (VYVANSE) 60 MG capsule Take 1 capsule (60 mg total) by mouth every morning. 03/30/18  Yes [name], MD  lisdexamfetamine (VYVANSE) 60 MG capsule Take 1 capsule (60 mg total) by mouth every morning. 03/30/18  Yes [name], MD  lisdexamfetamine (VYVANSE) 60 MG capsule Take 1 capsule (60 mg total) by mouth every morning. 03/30/18  Yes [name], MD  naproxen sodium (ANAPROX DS) 550 MG tablet 1 tab po q 12 hours prn pain 11/12/16  Yes Jeffery, Chelle, PA-C  PARoxetine (  PAXIL) 40 MG tablet Take 1 tablet (40 mg total) by mouth daily. 03/30/18 03/30/19 Yes [name], MD  zonisamide (ZONEGRAN) 25 MG capsule TK 4 CS PO QD HS 12/20/17  Yes [provider]   Social History   Socioeconomic History  . Marital status: Single    Spouse name: n/a  . Number of children: 0  . Years of education: Not on file  . Highest education level: Not on file  Occupational History  . Occupation: Education officer, community for [employer]  . Financial resource strain: Somewhat hard  . Food insecurity:    Worry: Never true    Inability: Never true  . Transportation needs:    Medical: No    Non-medical: No  Tobacco Use  . Smoking status: Former Smoker    Types: Cigarettes  . Smokeless tobacco: Never Used  Substance and Sexual Activity  . Alcohol use: Yes    Alcohol/week: 0.0 oz    Comment: only on special occasions   . Drug use: No  . Sexual activity: Yes    Partners: Male    Birth control/protection: Injection    Comment: 03/29/17  Lifestyle  . Physical activity:    Days per week: 0 days    Minutes per session: 0 min  . Stress: Very much  Relationships  . Social connections:    Talks on phone: Once a week    Gets together: More than three times a week    Attends religious service: 1 to 4 times per year    Active member of club or organization: No    Attends meetings of clubs or organizations: Never    Relationship status: Never married  . Intimate partner violence:    Fear of current or ex partner: No    Emotionally abused: No    Physically abused: No    Forced sexual activity: No  Other Topics Concern  . Not on file  Social History Narrative   Lives with her mother (adopted) and significant other.  Her sister lives in [place], [place] with her nephew Jamse Arn, born 12/31/2014.   Graduated from [employer].   Review of Systems  Constitutional: Negative for chills, fatigue, fever and unexpected weight change.  HENT: Positive for hearing loss (decreased, ears feeling full).   Respiratory: Negative for cough.   Gastrointestinal: Negative for constipation, diarrhea, nausea and vomiting.  Skin: Negative for rash and wound.  Neurological: Negative for dizziness, weakness and headaches.       Objective:   Physical Exam  Constitutional: She is oriented to person, place, and time. She appears well-developed and well-nourished. No distress.  HENT:  Head: Normocephalic and  atraumatic.  Right Ear: Tympanic membrane and ear canal normal.  Left Ear: Tympanic membrane and ear canal normal.  Eyes: Pupils are equal, round, and reactive to light. EOM are normal.  Neck: Neck supple.  Cardiovascular: Normal rate.  Pulmonary/Chest: Effort normal. No respiratory distress.  Musculoskeletal: Normal range of motion.  Neurological: She is alert and oriented to person, place, and time.  Skin: Skin is warm and dry.  Psychiatric: She has a normal mood and affect. Her behavior is normal.  Nursing note and vitals reviewed.   Vitals:   05/18/18 1106  BP: 127/80  Pulse: 86  Temp: 98.5 F (36.9 C)  TempSrc: Oral  SpO2: 98%  Weight: 290 lb (131.5 kg)  Height: 5\' 4"  (1.626 m)   Results for orders placed or performed in visit  on 05/18/18  POCT urine pregnancy  Result Value Ref Range   Preg Test, Ur Negative Negative       Assessment & Plan:   Wendy Levy is a 22 y.o. female Routine screening for STI (sexually transmitted infection) - Plan: GC/Chlamydia Probe Amp, HIV antibody, RPR  Encounter for surveillance of injectable contraceptive - Plan: POCT urine pregnancy, medroxyPROGESTERone Acetate SUSY 150 mg  Routine STI testing performed at her request.  Denies acute symptoms.  Safer sex practices discussed.  Tolerating Depoprovera without difficulty. Timing interval of Depo-Provera injection was discussed.  She is just outside of the recommended window for repeat injection.  hCG negative in office, but discussed potential false negative if too early.  Did discuss option of delaying Depo-Provera for 2 weeks with repeat hCG at that time, but would like to receive Depo-Provera today.  Potential risks discussed if early pregnancy, but unlikely.  Plans on home pregnancy test in the next 10 to 14 days.  Window for next injection given on handout as well as separate form.  Meds ordered this encounter  Medications  . medroxyPROGESTERone Acetate SUSY 150 mg   Patient  Instructions    Schedule appointment with Chelle for planned follow up.  Although Depoprovera was given today, use back up form of contraception for at least 1 week, and would recommend you repeat home pregnancy test within the next 10-14 days. If positive - please return here or discuss with your primary provider.   Next Depo injection is due between September 5th up until September 19th.   Ears look ok today. If you do continue to experience hearing issues, please discuss with your primary provider to determine if further testing is needed.    IF you received an x-ray today, you will receive an invoice from [facility]. Please contact [facility] at [phone] with questions or concerns regarding your invoice.   IF you received labwork today, you will receive an invoice from [facility]. Please contact [facility] at [phone] with questions or concerns regarding your invoice.   Our billing staff will not be able to assist you with questions regarding bills from these companies.  You will be contacted with the lab results as soon as they are available. The fastest way to get your results is to activate your My Chart account. Instructions are located on the last page of this paperwork. If you have not heard from [place] regarding the results in 2 weeks, please contact this office.       I personally performed the services described in this documentation, which was scribed in my presence. The recorded information has been reviewed and considered for accuracy and completeness, addended by me as needed, and agree with information above.  Signed,   Merri Ray, MD [facility].  05/18/18 4:40 PM

## 2018-05-18 NOTE — Patient Instructions (Addendum)
  Schedule appointment with Chelle for planned follow up.  Although Depoprovera was given today, use back up form of contraception for at least 1 week, and would recommend you repeat home pregnancy test within the next 10-14 days. If positive - please return here or discuss with your primary provider.   Next Depo injection is due between September 5th up until September 19th.   Ears look ok today. If you do continue to experience hearing issues, please discuss with your primary provider to determine if further testing is needed.    IF you received an x-ray today, you will receive an invoice from [facility]. Please contact [facility] at [phone] with questions or concerns regarding your invoice.   IF you received labwork today, you will receive an invoice from [facility]. Please contact [facility] at [phone] with questions or concerns regarding your invoice.   Our billing staff will not be able to assist you with questions regarding bills from these companies.  You will be contacted with the lab results as soon as they are available. The fastest way to get your results is to activate your My Chart account. Instructions are located on the last page of this paperwork. If you have not heard from [place] regarding the results in 2 weeks, please contact this office.

## 2018-05-19 LAB — HIV ANTIBODY (ROUTINE TESTING W REFLEX): HIV SCREEN 4TH GENERATION: NONREACTIVE

## 2018-05-19 LAB — RPR: RPR Ser Ql: NONREACTIVE

## 2018-05-20 LAB — GC/CHLAMYDIA PROBE AMP
Chlamydia trachomatis, NAA: NEGATIVE
Neisseria gonorrhoeae by PCR: NEGATIVE

## 2018-06-15 ENCOUNTER — Ambulatory Visit (HOSPITAL_COMMUNITY): Payer: BC Managed Care – PPO | Admitting: Psychology

## 2018-06-29 ENCOUNTER — Ambulatory Visit (HOSPITAL_COMMUNITY): Payer: BC Managed Care – PPO | Admitting: Psychiatry

## 2018-06-29 ENCOUNTER — Encounter (HOSPITAL_COMMUNITY): Payer: Self-pay | Admitting: Psychiatry

## 2018-06-29 VITALS — BP 108/74 | HR 96 | Ht 64.0 in | Wt 288.0 lb

## 2018-06-29 DIAGNOSIS — Z818 Family history of other mental and behavioral disorders: Secondary | ICD-10-CM

## 2018-06-29 DIAGNOSIS — F99 Mental disorder, not otherwise specified: Secondary | ICD-10-CM

## 2018-06-29 DIAGNOSIS — F4310 Post-traumatic stress disorder, unspecified: Secondary | ICD-10-CM | POA: Diagnosis not present

## 2018-06-29 DIAGNOSIS — Z87891 Personal history of nicotine dependence: Secondary | ICD-10-CM

## 2018-06-29 DIAGNOSIS — F900 Attention-deficit hyperactivity disorder, predominantly inattentive type: Secondary | ICD-10-CM | POA: Diagnosis not present

## 2018-06-29 DIAGNOSIS — F341 Dysthymic disorder: Secondary | ICD-10-CM

## 2018-06-29 DIAGNOSIS — F5105 Insomnia due to other mental disorder: Secondary | ICD-10-CM

## 2018-06-29 DIAGNOSIS — R450 Nervousness: Secondary | ICD-10-CM

## 2018-06-29 MED ORDER — LISDEXAMFETAMINE DIMESYLATE 60 MG PO CAPS: 60.0000 mg | ORAL_CAPSULE | ORAL | 0 refills | Status: DC

## 2018-06-29 MED ORDER — PAROXETINE HCL 40 MG PO TABS: 40.0000 mg | ORAL_TABLET | Freq: Every day | ORAL | 0 refills | Status: DC

## 2018-06-29 MED ORDER — HYDROXYZINE HCL 25 MG PO TABS: 25.0000 mg | ORAL_TABLET | Freq: Every evening | ORAL | 0 refills | Status: DC | PRN
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
## 2018-06-29 NOTE — Patient Instructions (Signed)
YOU HAVE A CHOICE ABOUT HOW TO GET SERVICES WHEN YOU ARE IN A CRISIS Phone First. [facility] for MH/DD/SA [facility] is available 24 hours a day, 7 days a week. Customer Service Specialists will assist you to find a crisis provider that is well-matched with your needs. Your local number is: [phone] If you already have a service provider, call them first. Providers who know you are usually best prepared to assist you in a crisis. Have Support Come to You. Crisis situations are often best resolved at home. Mobile Crisis Teams are available 24 hours a day in all counties. Professional counselors will speak with you and your family during a visit. They have an average response time of 2 hours. This service is provided by: [facility] [phone] [facility] [place]. Many counties have a specialized crisis center where you can walk in for a crisis assessment and referrals to additional services. Appointments are not needed. The crisis center in your county is provided by: [facility] [address], [place], [place] [address] [phone]

## 2018-07-12 ENCOUNTER — Ambulatory Visit (HOSPITAL_COMMUNITY): Payer: BC Managed Care – PPO | Admitting: Psychology

## 2018-07-12 DIAGNOSIS — F4310 Post-traumatic stress disorder, unspecified: Secondary | ICD-10-CM | POA: Diagnosis not present

## 2018-07-12 DIAGNOSIS — F341 Dysthymic disorder: Secondary | ICD-10-CM | POA: Diagnosis not present
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
## 2018-08-02 ENCOUNTER — Ambulatory Visit (HOSPITAL_COMMUNITY): Payer: BC Managed Care – PPO | Admitting: Psychology

## 2018-08-02 DIAGNOSIS — F4310 Post-traumatic stress disorder, unspecified: Secondary | ICD-10-CM | POA: Diagnosis not present

## 2018-08-02 DIAGNOSIS — F341 Dysthymic disorder: Secondary | ICD-10-CM | POA: Diagnosis not present

## 2018-08-02 NOTE — Progress Notes (Signed)
   THERAPIST PROGRESS NOTE  Session Time: 11.00am-11.30am  Participation Level: Active  Behavioral Response: Well GroomedAlertaffect wnl  Type of Therapy: Individual Therapy  Treatment Goals addressed: Diagnosis: Dysthymia nd goal 1.  Interventions: Solution Focused  Summary: Wendy Levy is a 22 y.o. female who presents with affect wnl. Pt reported that today needed to focus on plan for moving out w/ concrete steps.  Pt reports this is initiated by mom asking for pt to have a plan in writing.  Pt reports still want to move out and be on own.  Pt hasn't obtained work at this point.  Last application placed 2 days ago.  Pt acknowledge need to initiate more to get job, save money and find a place.  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   Suicidal/Homicidal: Nowithout intent/plan  Therapist Response: Assessed pt current functioning per pt report.  Processed w/pt her motivation for moving out and steps she needs to take.  Solution focus w/ pt writing her plan of action w/ concrete steps.   Plan: Return again in 2 weeks.  Diagnosis: Dysthymia and PTSD   Zen Felling, Dane 08/02/2018

## 2018-08-23 ENCOUNTER — Ambulatory Visit (HOSPITAL_COMMUNITY): Payer: BC Managed Care – PPO | Admitting: Psychology

## 2018-08-23 DIAGNOSIS — F341 Dysthymic disorder: Secondary | ICD-10-CM | POA: Diagnosis not present

## 2018-08-23 NOTE — Progress Notes (Signed)
   THERAPIST PROGRESS NOTE  Session Time: 10am-10.25am  Participation Level: Active  Behavioral Response: Fairly GroomedAlertaffect wnl  Type of Therapy: Individual Therapy  Treatment Goals addressed: Diagnosis: Dysthymia and goal 1,.  Interventions: Solution Focused and Supportive  Summary: Wendy Levy is a 22 y.o. female who presents with affect wnl.  Pt reported she needed to leave early as helping her sister move to hotel and needs to check out by certain time.  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  Pt reported she has found companies that are currently hiring- needs to make contact.  Pt hasn't completed researching CDL programs.  Pt increased her awareness of need to make further steps as mom has given Nov 1st for finding a job or need to leave. Pt reported sister aware now that dad reliable and so that is why moved out- pt reports she no longer has any contact w/ bio dad and aware he is not changing.      Suicidal/Homicidal: Nowithout intent/plan  Therapist Response: Assessed pt current functioning per pt report.  Discussed w/ pt steps she is taking to her goal for job.  Reflected progress and reflecting areas for more progress and discussed w/ pt time frame working w/.  Had pt identify steps she can take now- not wait.   Plan: Return again in 3 weeks.  Diagnosis: Dysthymia  Kerisha Goughnour, LPC 08/23/2018

## 2018-08-30 ENCOUNTER — Other Ambulatory Visit (HOSPITAL_COMMUNITY): Payer: Self-pay | Admitting: Psychiatry

## 2018-08-30 DIAGNOSIS — F341 Dysthymic disorder: Secondary | ICD-10-CM

## 2018-08-30 DIAGNOSIS — F4310 Post-traumatic stress disorder, unspecified: Secondary | ICD-10-CM

## 2018-09-06 IMAGING — DX DG LUMBAR SPINE COMPLETE 4+V
5 series · 5 of 5 positions shown · non-contrast
Comparison: None.

CLINICAL DATA: Pain in the buttock region and posterior legs.

EXAM:
LUMBAR SPINE - COMPLETE 4+ VIEW

[l-spine ap]
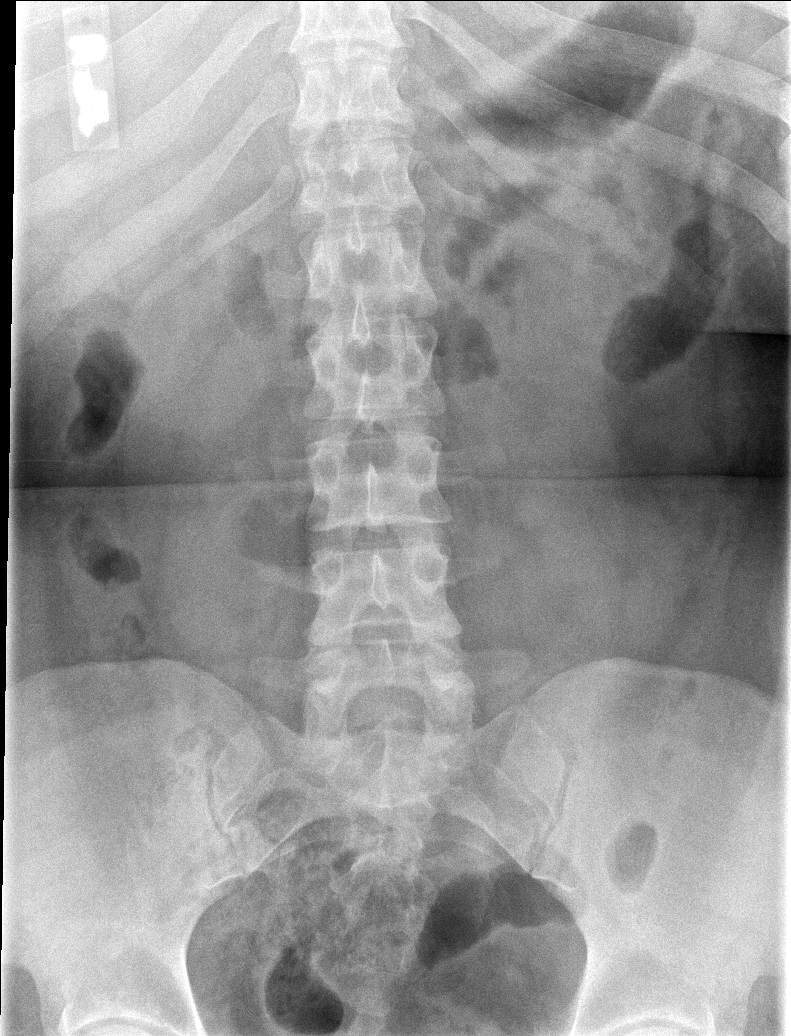

[l-spine obl (1 of 2)]
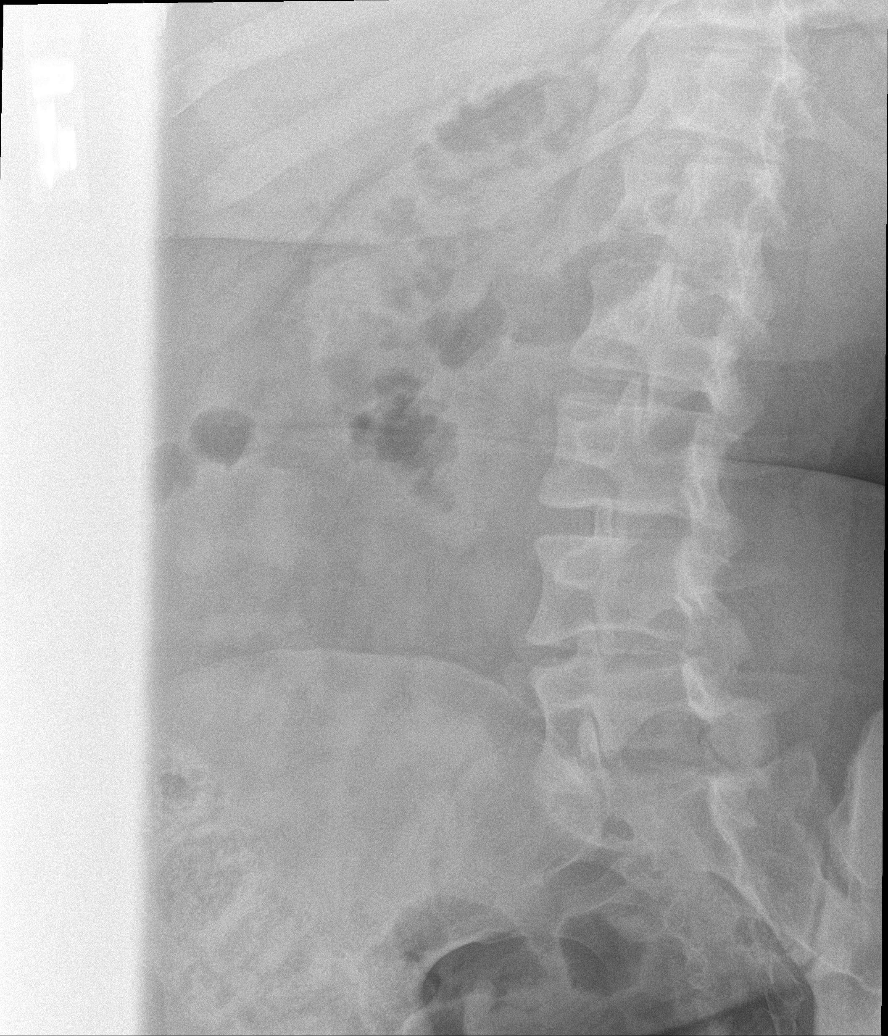

[l-spine obl (2 of 2)]
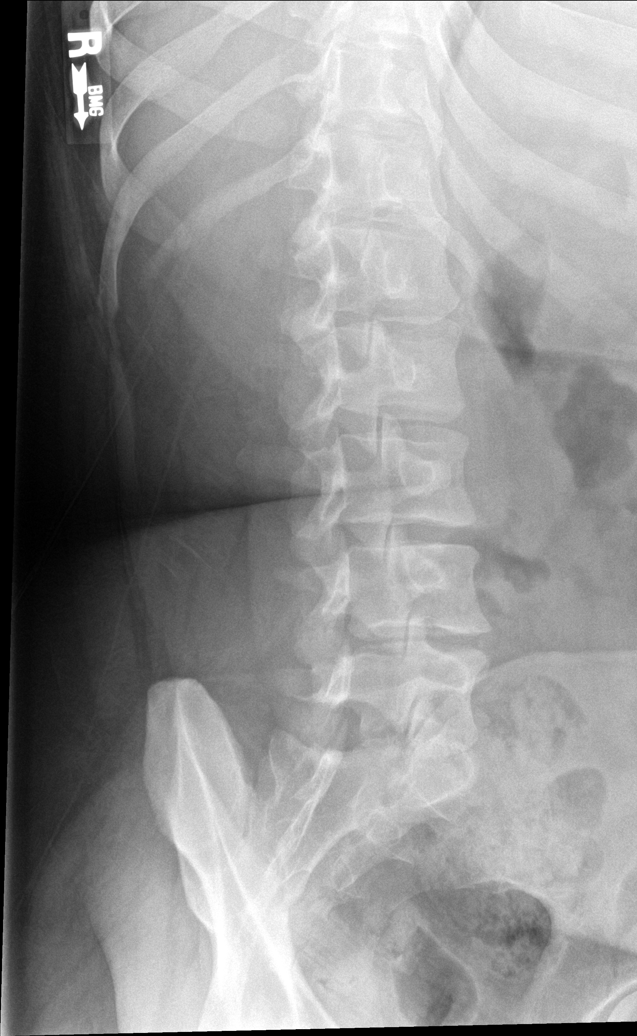

[l-spine lat]
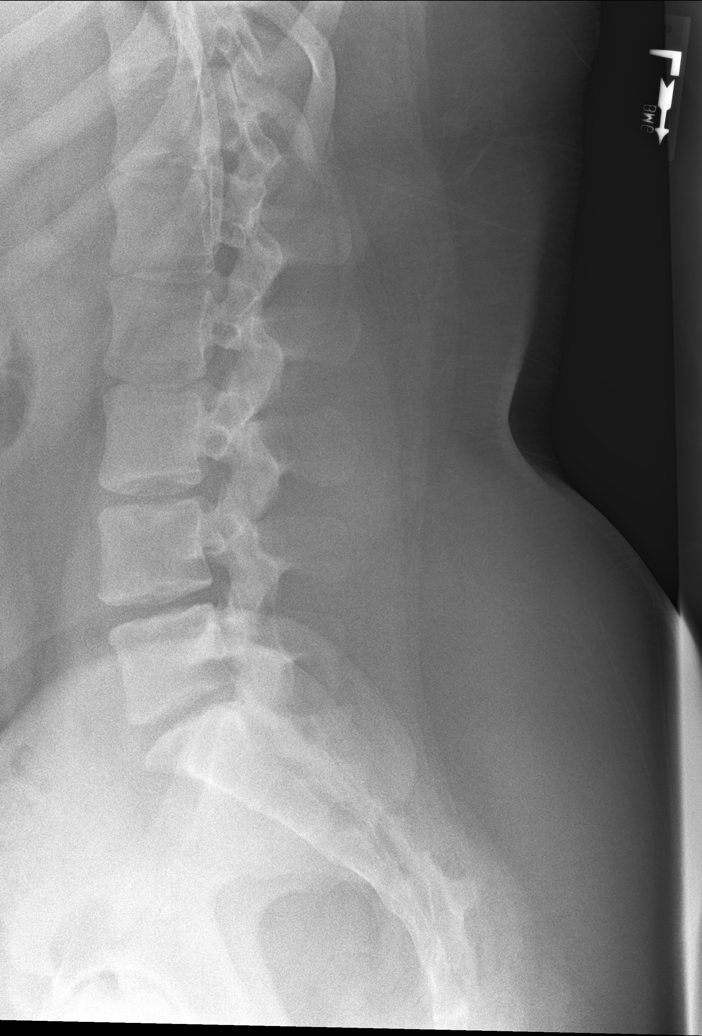

[l-spine l5-s1]
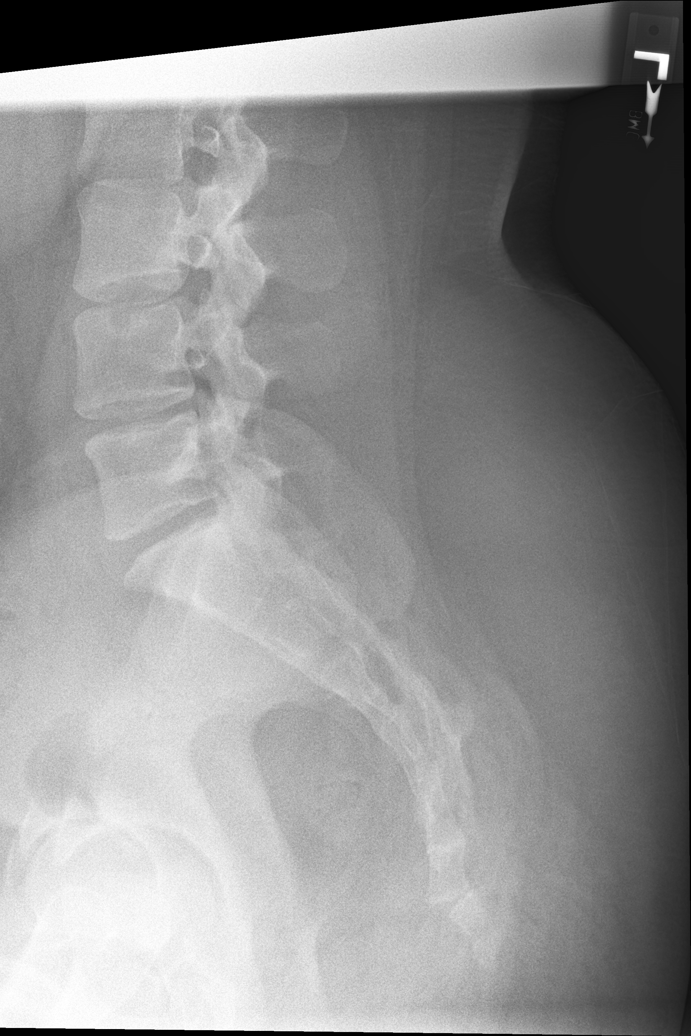

[5 of 5 positions shown; findings below may reference images not displayed]

FINDINGS: There is no evidence of lumbar spine fracture. Alignment is normal.
Intervertebral disc spaces are maintained.
IMPRESSION: Negative.

## 2018-09-06 IMAGING — DX DG PELVIS 1-2V
1 series · 1 of 1 positions shown · non-contrast
Comparison: Down

CLINICAL DATA: Pain in buttock region and lower legs.

EXAM:
PELVIS - 1-2 VIEW

[pelvis ap]
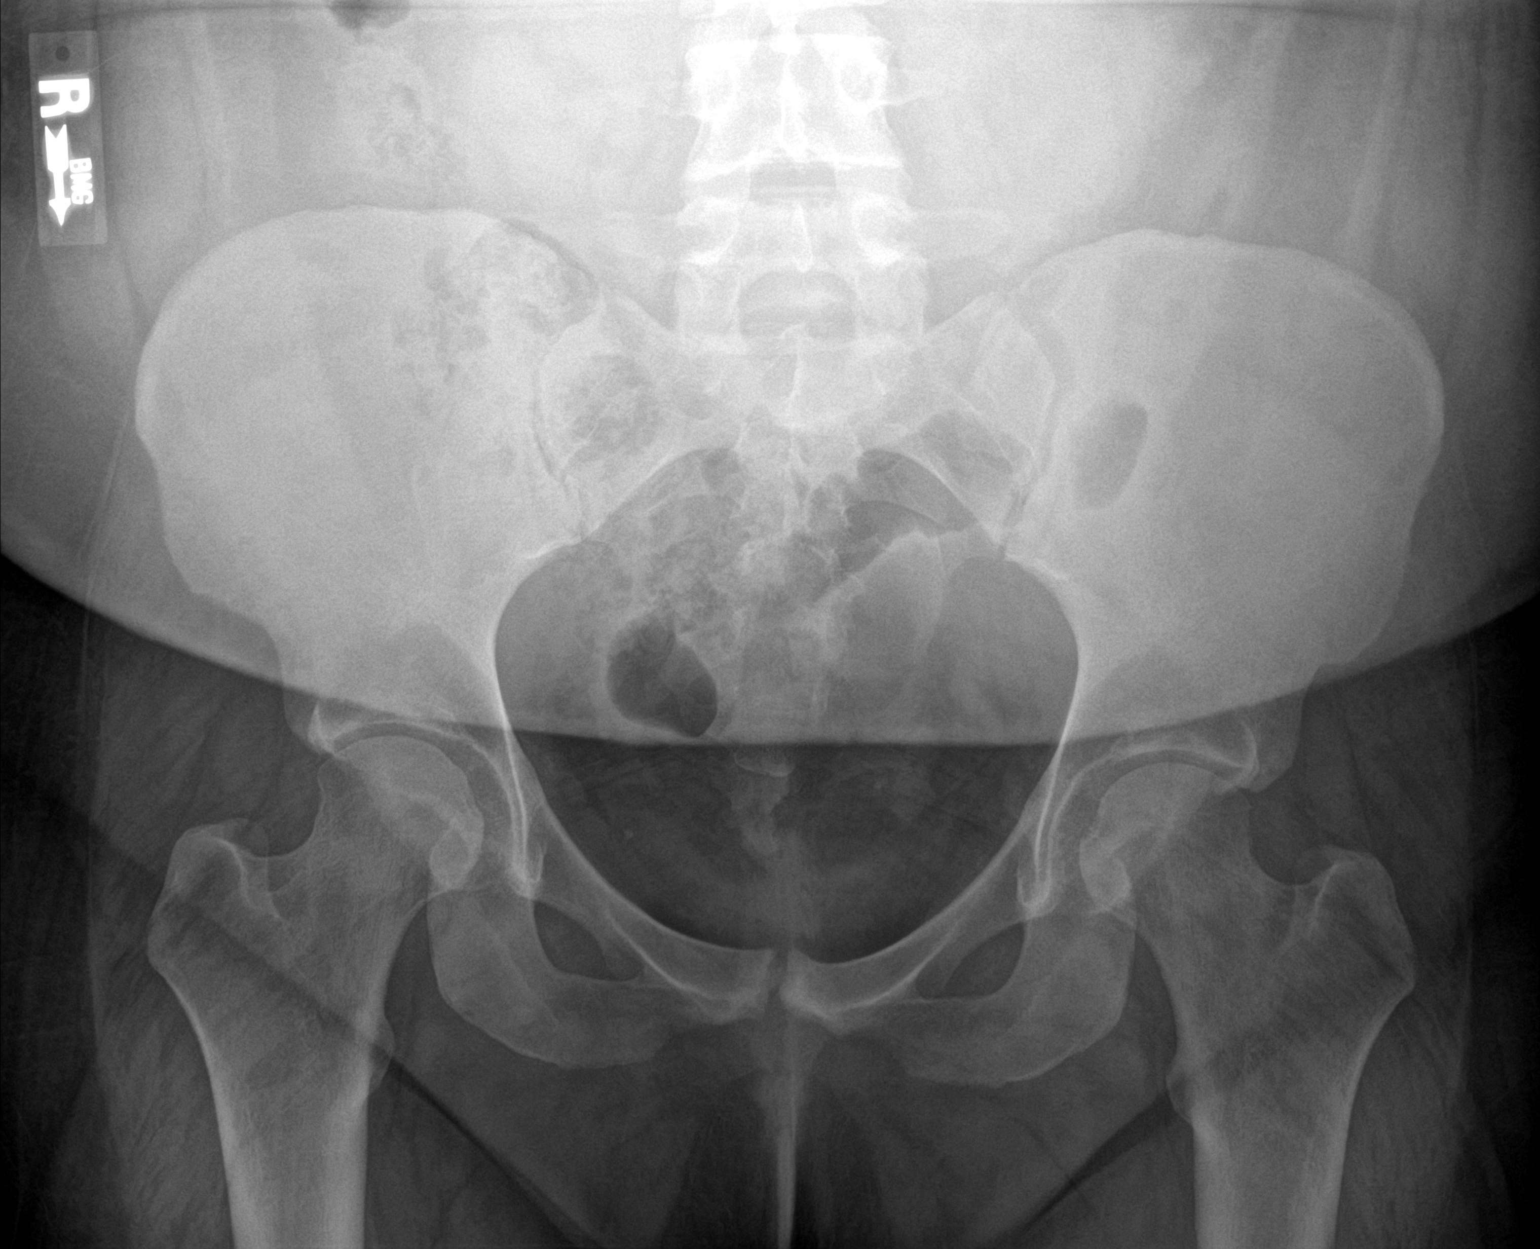

[1 of 1 positions shown; findings below may reference images not displayed]

FINDINGS: There is no evidence of pelvic fracture or diastasis. No pelvic bone
lesions are seen.
IMPRESSION: Negative.

## 2018-09-13 ENCOUNTER — Ambulatory Visit (HOSPITAL_COMMUNITY): Payer: BC Managed Care – PPO | Admitting: Psychology

## 2018-09-13 DIAGNOSIS — F341 Dysthymic disorder: Secondary | ICD-10-CM | POA: Diagnosis not present

## 2018-09-13 NOTE — Progress Notes (Signed)
   THERAPIST PROGRESS NOTE  Session Time: 9.55am-10.28am  Participation Level: Active  Behavioral Response: Fairly GroomedAlertIrritable  Type of Therapy: Individual Therapy  Treatment Goals addressed: Diagnosis: Dysthymia and goal 1.  Interventions: CBT and Solution Focused  Summary: Wendy Levy is a 22 y.o. female who presents with affect congruent w/ report of easily frustrated.  Pt reported good and bad news.  Pt reported that she got her job back at [employer] and is working about 15 hours a week.  Pt reported she has had that job for 3 weeks and one pay check.  Pt reported the bad news is she is homeless- living in tent of a friend w/ boyfriend.  Pt reported that left mom's home by choice as mom wanted certain percentage of paycheck and she wasn't willing to give.  Pt was defensive when discussing financial stability and feels that she is working enough to support herself.  Pt reported that w/ first pay check spent 55$ for hotel room.  Pt reported they are aloud to return one time a week to mom's to sleep- but after nov 1 boyfriend not welcome.  Pt reports that she is using community resources for food- shower.  Pt is worried about boyfriend's court date at end of month as hasn't taken drug class.  Pt reports she hasn't been taking meds since end of sept- needs to talk w/ mom about picking up she reports.    Suicidal/Homicidal: Nowithout intent/plan  Therapist Response: Assessed pt current functioning per pt report. Processed w/pt her decision to move out and discussed finances and ability to take care of self.  Discussed need for pt to plan for budget and other ways mom may pass on responsibility for paying (ie. Gas $, car upkeep, insurance etc. ).    Plan: Return again in 2-3weeks.  Diagnosis: Dysthymia  Chase Arnall, LPC 09/13/2018

## 2018-09-21 ENCOUNTER — Ambulatory Visit (HOSPITAL_COMMUNITY): Payer: BC Managed Care – PPO | Admitting: Psychiatry

## 2018-09-21 ENCOUNTER — Encounter (HOSPITAL_COMMUNITY): Payer: Self-pay | Admitting: Psychiatry

## 2018-09-21 DIAGNOSIS — F341 Dysthymic disorder: Secondary | ICD-10-CM | POA: Diagnosis not present

## 2018-09-21 DIAGNOSIS — F4310 Post-traumatic stress disorder, unspecified: Secondary | ICD-10-CM

## 2018-09-21 DIAGNOSIS — F5105 Insomnia due to other mental disorder: Secondary | ICD-10-CM | POA: Diagnosis not present

## 2018-09-21 DIAGNOSIS — F900 Attention-deficit hyperactivity disorder, predominantly inattentive type: Secondary | ICD-10-CM

## 2018-09-21 DIAGNOSIS — F99 Mental disorder, not otherwise specified: Secondary | ICD-10-CM

## 2018-09-21 MED ORDER — PAROXETINE HCL 40 MG PO TABS: 40.0000 mg | ORAL_TABLET | Freq: Every day | ORAL | 0 refills | Status: DC

## 2018-09-21 MED ORDER — LISDEXAMFETAMINE DIMESYLATE 60 MG PO CAPS: 60.0000 mg | ORAL_CAPSULE | ORAL | 0 refills | Status: DC

## 2018-09-21 MED ORDER — HYDROXYZINE HCL 25 MG PO TABS: 25.0000 mg | ORAL_TABLET | Freq: Every evening | ORAL | 0 refills | Status: DC | PRN

## 2018-09-21 NOTE — Progress Notes (Signed)
[facility] MD/PA/NP OP Progress Note  09/21/2018 11:07 AM Wendy Levy  MRN:  [id number]  Chief Complaint:  Chief Complaint    Follow-up     HPI: Pt got her job back at the beginning of October. Pt states she has no concerns today. She is very tired because she is not getting enough sleep. She is getting about 7 hrs of sleep instead of her usual 12 hrs. Pt reports her adopted mom kicked Vivan out 2 weeks ago- she states it was a Therapist, music. Pt states she chose to "live on the stress because I needed to get away". She still has contact with her mom. They did not part due to any issues. Pt wanted to learn how to be independent. Pt has been sleeping in her car and plans to go to a motel when she gets paid. Pt has been experiencing unstable housing for the last 6 months. Depression seems "fine". Pt denies SIB and SI/HI. Pt has support from her boyfriend as they are living together in her car. ADHD is "still there" but Vyvanse is effective at work.   Visit Diagnosis:    ICD-10-CM   1. Insomnia due to other mental disorder F51.05 hydrOXYzine (ATARAX/VISTARIL) 25 MG tablet   F99   2. Attention deficit hyperactivity disorder (ADHD), predominantly inattentive type F90.0 lisdexamfetamine (VYVANSE) 60 MG capsule    lisdexamfetamine (VYVANSE) 60 MG capsule  3. PTSD (post-traumatic stress disorder) F43.10 PARoxetine (PAXIL) 40 MG tablet  4. 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 ADHD (attention deficit hyperactivity disorder)   . Allergy   . Anxiety   . Depression   . Dysmenorrhea    Cramps periodically.   Marland Kitchen History of chlamydia 2016  .  Oppositional defiant disorder   . Shoulder dislocation    bilateral  . Wrist fracture, bilateral     Past Surgical History:  Procedure Laterality Date  . WISDOM TOOTH EXTRACTION  04/2013    Family Psychiatric History:  Family History  Adopted: Yes  Problem Relation Age of Onset  . Depression Sister        reportedly in and out of treatment facilities, also dx w/ RAD  . Suicidality Sister     Social History:  Social History   Socioeconomic History  . Marital status: Single    Spouse name: n/a  . Number of children: 0  . Years of education: Not on file  . Highest education level: Not on file  Occupational History  . Occupation: Education officer, community for [employer]  . Financial resource strain: Somewhat hard  . Food insecurity:    Worry: Never true    Inability: Never true  . Transportation needs:    Medical: No    Non-medical: No  Tobacco Use  . Smoking status: Former Smoker    Types: Cigarettes  . Smokeless tobacco: Never Used  Substance and Sexual Activity  . Alcohol use: Not Currently    Alcohol/week: 0.0 standard drinks    Comment: only on special occasions   . Drug use: No  . Sexual activity: Yes    Partners: Male    Birth control/protection: Injection    Comment:  03/29/17  Lifestyle  . Physical activity:    Days per week: 0 days    Minutes per session: 0 min  . Stress: Very much  Relationships  . Social connections:    Talks on phone: Once a week    Gets together: More than three times a week    Attends religious service: 1 to 4 times per year    Active member of club or organization: No    Attends meetings of clubs or organizations: Never    Relationship status: Never married  Other Topics Concern  . Not on file  Social History Narrative   Lives with her mother (adopted) and significant other.  Her sister lives in [place], [place] with her nephew Jamse Arn, born 12/31/2014.   Graduated from [employer].    Allergies:  Allergies   Allergen Reactions  . Benadryl Anti-Itch Childrens [Camphor] Rash    Rash with topical product    Metabolic Disorder Labs: Lab Results  Component Value Date   HGBA1C 5.2 02/12/2014   No results found for: PROLACTIN Lab Results  Component Value Date   CHOL 209 (H) 04/28/2016   TRIG 315 (H) 04/28/2016   HDL 51 04/28/2016   CHOLHDL 4.1 04/28/2016   VLDL 63 (H) 04/28/2016   LDLCALC 95 04/28/2016   LDLCALC 125 (H) 02/12/2014   Lab Results  Component Value Date   TSH 2.070 11/02/2017   TSH 1.44 04/28/2016    Therapeutic Level Labs: No results found for: LITHIUM No results found for: VALPROATE No components found for:  CBMZ  Current Medications: Current Outpatient Medications  Medication Sig Dispense Refill  . albuterol (PROVENTIL HFA;VENTOLIN HFA) 108 (90 Base) MCG/ACT inhaler Inhale 1-2 puffs into the lungs every 4 (four) hours as needed for wheezing or shortness of breath. 1 Inhaler 0  . baclofen (LIORESAL) 10 MG tablet Take 10 mg by mouth 2 (two) times daily.    . cetirizine (ZYRTEC) 10 MG tablet Take 10 mg by mouth daily as needed for allergies.    . hydrOXYzine (ATARAX/VISTARIL) 25 MG tablet Take 1 tablet (25 mg total) by mouth at bedtime as needed (sleep). 90 tablet 0  . lisdexamfetamine (VYVANSE) 60 MG capsule Take 1 capsule (60 mg total) by mouth every morning. 30 capsule 0  . lisdexamfetamine (VYVANSE) 60 MG capsule Take 1 capsule (60 mg total) by mouth every morning. 30 capsule 0  . PARoxetine (PAXIL) 40 MG tablet Take 1 tablet (40 mg total) by mouth daily. 90 tablet 0  . zonisamide (ZONEGRAN) 25 MG capsule TK 4 CS PO QD HS  1  . naproxen sodium (ANAPROX DS) 550 MG tablet 1 tab po q 12 hours prn pain (Patient not taking: Reported on 06/29/2018) 30 tablet 2   No current facility-administered medications for this visit.      Musculoskeletal: Strength & Muscle Tone: within normal limits Gait & Station: normal Patient leans: N/A  Psychiatric Specialty  Exam: Review of Systems  Constitutional: Negative for chills, diaphoresis and fever.  HENT: Negative for congestion, sinus pain and sore throat.     Blood pressure 120/74, pulse 85, height 5\' 4"  (1.626 m), weight 287 lb (130.2 kg), SpO2 96 %.Body mass index is 49.26 kg/m.  General Appearance: Disheveled and malodorous  Eye Contact:  Fair  Speech:  Clear and Coherent and Normal Rate  Volume:  Normal  Mood:  Euthymic  Affect:  Blunt  Thought Process:  Linear and Descriptions of Associations: Intact  Orientation:  Full (  Time, Place, and Person)  Thought Content:  Logical  Suicidal Thoughts:  No  Homicidal Thoughts:  No  Memory:  Immediate;   Good  Judgement:  Poor  Insight:  Present  Psychomotor Activity:  Normal  Concentration:  Concentration: Fair  Recall:  [place] of Knowledge:  Good  Language:  Good  Akathisia:  No  Handed:  Right  AIMS (if indicated):     Assets:  Communication Skills Desire for [place] Talents/Skills Transportation Vocational/Educational  ADL's:  Intact  Cognition:  WNL  Sleep:   fair     Screenings: PHQ2-9     Office Visit from confidential encounter on 05/18/2018 Office Visit from confidential encounter on 11/02/2017 Office Visit from confidential encounter on 05/16/2017 Office Visit from confidential encounter on 05/09/2017 Office Visit from confidential encounter on 03/29/2017  PHQ-2 Total Score  0  6  0  0  0  PHQ-9 Total Score  -  9  -  -  -      Reviewed the information below on 09/21/2018 and have updated it Assessment and Plan: ADHD-combined type; ODD; dysthymic disorder; PTSD; insomnia    Medication management with supportive therapy. Risks/benefits and SE of the medication discussed. Pt verbalized understanding and verbal consent obtained for treatment.  Affirm with the patient that the medications are taken as ordered. Patient expressed understanding of how their medications were to be used.   The risk of  un-intended pregnancy is low based on the fact that pt reports she is on Depoprovera injections. Pt is aware that these meds carry a teratogenic risk. Pt will discuss plan of action if she does or plans to become pregnant in the future.   Meds: Vyvanse 60 mg p.o. every morning for ADHD  Paxil 40 mg p.o. daily for dysthymia, PTSD and ODD Vistaril 25 mg p.o. nightly as needed insomnia   Labs: none  Therapy: brief supportive therapy provided. Discussed psychosocial stressors in detail.   Pt given information about local crisis centers and hotlines  Consultations: referred for sleep study for possible sleep apnea Encouraged to follow up with therapist Encouraged to follow up with PCP as needed  Pt denies SI and is at an acute low risk for suicide. Patient told to call clinic if any problems occur. Patient advised to go to ER if they should develop SI/HI, side effects, or if symptoms worsen. Has crisis numbers to call if needed. Pt verbalized understanding.  F/up in 2 months or sooner if needed    [name], MD 09/21/2018, 11:07 AM

## 2018-10-04 ENCOUNTER — Ambulatory Visit (HOSPITAL_COMMUNITY): Payer: BC Managed Care – PPO | Admitting: Psychology

## 2018-11-06 ENCOUNTER — Ambulatory Visit (HOSPITAL_COMMUNITY): Payer: BC Managed Care – PPO | Admitting: Psychology

## 2018-11-06 ENCOUNTER — Encounter (HOSPITAL_COMMUNITY): Payer: Self-pay | Admitting: Psychology

## 2018-11-06 NOTE — Progress Notes (Signed)
Wendy Levy is a 22 y.o. female patient who called and asked if mom could come in for apppointment today as pt has to work at appointment time.  Counselor informed her that wouldn't be appropriate that if family session needed- mom could join pt for appointment.  Pt informed she would reschedule. Marland Kitchen        Jan Fireman, LPC

## 2018-12-07 ENCOUNTER — Encounter (HOSPITAL_COMMUNITY): Payer: Self-pay | Admitting: Psychiatry

## 2018-12-07 ENCOUNTER — Ambulatory Visit (HOSPITAL_COMMUNITY): Payer: BC Managed Care – PPO | Admitting: Psychiatry

## 2018-12-07 DIAGNOSIS — F99 Mental disorder, not otherwise specified: Secondary | ICD-10-CM

## 2018-12-07 DIAGNOSIS — F341 Dysthymic disorder: Secondary | ICD-10-CM | POA: Diagnosis not present

## 2018-12-07 DIAGNOSIS — F4310 Post-traumatic stress disorder, unspecified: Secondary | ICD-10-CM | POA: Diagnosis not present

## 2018-12-07 DIAGNOSIS — F5105 Insomnia due to other mental disorder: Secondary | ICD-10-CM | POA: Diagnosis not present

## 2018-12-07 DIAGNOSIS — F900 Attention-deficit hyperactivity disorder, predominantly inattentive type: Secondary | ICD-10-CM | POA: Diagnosis not present

## 2018-12-07 MED ORDER — PAROXETINE HCL 40 MG PO TABS: 40.0000 mg | ORAL_TABLET | Freq: Every day | ORAL | 0 refills | Status: DC

## 2018-12-07 MED ORDER — LISDEXAMFETAMINE DIMESYLATE 60 MG PO CAPS: 60.0000 mg | ORAL_CAPSULE | ORAL | 0 refills | Status: DC

## 2018-12-07 MED ORDER — HYDROXYZINE HCL 25 MG PO TABS: 25.0000 mg | ORAL_TABLET | Freq: Every evening | ORAL | 0 refills | Status: DC | PRN

## 2018-12-07 NOTE — Progress Notes (Signed)
[facility] MD/PA/NP OP Progress Note  12/07/2018 11:55 AM Wendy Levy  MRN:  [id number]  Chief Complaint:  Chief Complaint    Follow-up     HPI: Patient reports that she is doing well and has no concerns at this time.  She states she is taking the Vistaril every night and is sleeping all right.  No one from the sleep clinic contacted her so she has not had a sleep study done yet.  She is working and states she loves her job.  She is taking the Vyvanse and is productive.  She is denying any symptoms of depression including isolation, anhedonia, crying spells.  Wendy Levy denies SI/HI.  She is also denying all symptoms of PTSD.   Visit Diagnosis:    ICD-10-CM   1. Insomnia due to other mental disorder F51.05 hydrOXYzine (ATARAX/VISTARIL) 25 MG tablet   F99   2. Attention deficit hyperactivity disorder (ADHD), predominantly inattentive type F90.0 lisdexamfetamine (VYVANSE) 60 MG capsule    lisdexamfetamine (VYVANSE) 60 MG capsule    lisdexamfetamine (VYVANSE) 60 MG capsule  3. PTSD (post-traumatic stress disorder) F43.10 PARoxetine (PAXIL) 40 MG tablet  4. 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 ADHD (attention deficit hyperactivity disorder)   . Allergy   . Anxiety   . Depression   . Dysmenorrhea    Cramps periodically.   Marland Kitchen History of chlamydia 2016  . Oppositional defiant disorder   . Shoulder dislocation    bilateral  . Wrist fracture, bilateral     Past Surgical History:  Procedure Laterality Date  . WISDOM TOOTH EXTRACTION  04/2013    Family Psychiatric History:  Family History  Adopted: Yes  Problem Relation Age of Onset   . Depression Sister        reportedly in and out of treatment facilities, also dx w/ RAD  . Suicidality Sister     Social History:  Social History   Socioeconomic History  . Marital status: Single    Spouse name: n/a  . Number of children: 0  . Years of education: Not on file  . Highest education level: Not on file  Occupational History  . Occupation: Education officer, community for [employer]  . Financial resource strain: Somewhat hard  . Food insecurity:    Worry: Never true    Inability: Never true  . Transportation needs:    Medical: No    Non-medical: No  Tobacco Use  . Smoking status: Former Smoker    Types: Cigarettes  . Smokeless tobacco: Never Used  Substance and Sexual Activity  . Alcohol use: Not Currently    Alcohol/week: 0.0 standard drinks    Comment: only on special occasions   . Drug use: No  . Sexual activity: Yes    Partners: Male    Birth control/protection: Injection    Comment: 03/29/17  Lifestyle  . Physical activity:    Days per week: 0 days    Minutes per session: 0 min  . Stress: Very much  Relationships  . Social connections:    Talks on phone: Once a week    Gets together: More than three times a week    Attends  religious service: 1 to 4 times per year    Active member of club or organization: No    Attends meetings of clubs or organizations: Never    Relationship status: Never married  Other Topics Concern  . Not on file  Social History Narrative   Lives with her mother (adopted) and significant other.  Her sister lives in [place], [place] with her nephew Wendy Levy, born 12/31/2014.   Graduated from [employer].    Allergies:  Allergies  Allergen Reactions  . Benadryl Anti-Itch Childrens [Camphor] Rash    Rash with topical product    Metabolic Disorder Labs: Lab Results  Component Value Date   HGBA1C 5.2 02/12/2014   No results found for: PROLACTIN Lab Results  Component Value Date   CHOL 209 (H) 04/28/2016    TRIG 315 (H) 04/28/2016   HDL 51 04/28/2016   CHOLHDL 4.1 04/28/2016   VLDL 63 (H) 04/28/2016   LDLCALC 95 04/28/2016   LDLCALC 125 (H) 02/12/2014   Lab Results  Component Value Date   TSH 2.070 11/02/2017   TSH 1.44 04/28/2016    Therapeutic Level Labs: No results found for: LITHIUM No results found for: VALPROATE No components found for:  CBMZ  Current Medications: Current Outpatient Medications  Medication Sig Dispense Refill  . albuterol (PROVENTIL HFA;VENTOLIN HFA) 108 (90 Base) MCG/ACT inhaler Inhale 1-2 puffs into the lungs every 4 (four) hours as needed for wheezing or shortness of breath. 1 Inhaler 0  . baclofen (LIORESAL) 10 MG tablet Take 10 mg by mouth 2 (two) times daily.    . cetirizine (ZYRTEC) 10 MG tablet Take 10 mg by mouth daily as needed for allergies.    Marland Kitchen zonisamide (ZONEGRAN) 25 MG capsule TK 4 CS PO QD HS  1  . hydrOXYzine (ATARAX/VISTARIL) 25 MG tablet Take 1 tablet (25 mg total) by mouth at bedtime as needed (sleep). 90 tablet 0  . lisdexamfetamine (VYVANSE) 60 MG capsule Take 1 capsule (60 mg total) by mouth every morning. 30 capsule 0  . lisdexamfetamine (VYVANSE) 60 MG capsule Take 1 capsule (60 mg total) by mouth every morning. 30 capsule 0  . lisdexamfetamine (VYVANSE) 60 MG capsule Take 1 capsule (60 mg total) by mouth every morning. 30 capsule 0  . naproxen sodium (ANAPROX DS) 550 MG tablet 1 tab po q 12 hours prn pain (Patient not taking: Reported on 06/29/2018) 30 tablet 2  . PARoxetine (PAXIL) 40 MG tablet Take 1 tablet (40 mg total) by mouth daily. 90 tablet 0   No current facility-administered medications for this visit.      Musculoskeletal: Strength & Muscle Tone: within normal limits Gait & Station: normal Patient leans: N/A   Psychiatric Specialty Exam: Review of Systems  HENT: Positive for congestion and sinus pain. Negative for sore throat.   Respiratory: Positive for cough and sputum production. Negative for shortness of  breath.     Blood pressure 118/64, pulse 91, height 5\' 4"  (1.626 m), weight 298 lb (135.2 kg), SpO2 98 %.Body mass index is 51.15 kg/m.  Attitude: distant and uninterested.   General Appearance: Disheveled and malodorous  Eye Contact:  Minimal  Speech:  Clear and Coherent and Slow  Volume:  Normal  Mood:  Euthymic  Affect:  Restricted  Thought Process:  Coherent, Linear and Descriptions of Associations: Intact  Orientation:  Full (Time, Place, and Person)  Thought Content:  Logical  Suicidal Thoughts:  No  Homicidal Thoughts:  No  Memory:  Immediate;  Good  Judgement:  Good  Insight:  Present  Psychomotor Activity:  Normal  Concentration:  Concentration: Good  Recall:  Good  Fund of Knowledge:  Good  Language:  Good  Akathisia:  No  Handed:  Right  AIMS (if indicated):     Assets:  Communication Skills Desire for Improvement Intimacy Vocational/Educational  ADL's:  Impaired  Cognition:  WNL  Sleep:   fair      Screenings: PHQ2-9     Office Visit from 05/18/2018 in Primary Care at [facility] from 11/02/2017 in Primary Care at [facility] from 05/16/2017 in Primary Care at [facility] from 05/09/2017 in Primary Care at [facility] from 03/29/2017 in [facility] Care at [facility] Total Score  0  6  0  0  0  PHQ-9 Total Score  -  9  -  -  -      I reviewed the information below on 12/07/2018 and have updated it Assessment and Plan: ADHD-combined type; ODD; dysthymic disorder; PTSD; insomnia    Medication management with supportive therapy. Risks/benefits and SE of the medication discussed. Pt verbalized understanding and verbal consent obtained for treatment.  Affirm with the patient that the medications are taken as ordered. Patient expressed understanding of how their medications were to be used.   The risk of un-intended pregnancy is low based on the fact that pt reports she is on Depoprovera injections. Pt is aware that these meds carry a  teratogenic risk. Pt will discuss plan of action if she does or plans to become pregnant in the future.   Meds: Vyvanse 60 mg p.o. every morning for ADHD  Paxil 40 mg p.o. daily for dysthymia, PTSD and ODD Vistaril 25 mg p.o. nightly as needed insomnia   Labs: none  Therapy: brief supportive therapy provided. Discussed psychosocial stressors in detail.     Consultations: referred for sleep study for possible sleep apnea- pt has not scheduled an appt Encouraged to follow up with therapist but has declined Encouraged to follow up with PCP as needed  Pt denies SI and is at an acute low risk for suicide. Patient told to call clinic if any problems occur. Patient advised to go to ER if they should develop SI/HI, side effects, or if symptoms worsen. Has crisis numbers to call if needed. Pt verbalized understanding.  F/up in 3 months or sooner if needed    [name], MD 12/07/2018, 11:55 AM

## 2018-12-11 ENCOUNTER — Ambulatory Visit (HOSPITAL_COMMUNITY): Payer: BC Managed Care – PPO | Admitting: Psychology

## 2018-12-11 DIAGNOSIS — F341 Dysthymic disorder: Secondary | ICD-10-CM

## 2018-12-11 NOTE — Progress Notes (Signed)
THERAPIST PROGRESS NOTE  Session Time: 10.07am-10.30am  Participation Level: Active  Behavioral Response: Fairly GroomedAlertaffect bright  Type of Therapy: Individual Therapy  Treatment Goals addressed: Diagnosis: dysthymic d/o and goal 1.  Interventions: Supportive and Other: discharge planning  Summary: Wendy Levy is a 23 y.o. female who presents with affect bright- pt grooming fair.  Pt reported that this will be her last appointment for counseling as she feels that she has met her needs at this time.  Pt reported that her mood has been good, sleep has been good and feels that managing stress well.  Pt reported that her and boyfriend are still out on own- transitional living and/or living in tent.  Pt reported that she is happy w/ this and has friend who has offered place to stay if unfavorable weather.  Pt reported she still has contact w/ mom and where she goes to take shower etc.  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  Pt reports she is not feeling stressed about things or relationship.  Pt is aware that she is eligible to return if depression or other symptoms become worse in the future.  pt is aware of how to contact office for future f/u. Pt reported she is maintain on meds w/ Dr. [name] and plans to continue w/ her.   Suicidal/Homicidal: Nowithout intent/plan  Therapist Response: Assessed pt current functioning per pt report.  Explored w/ pt her wants for discharge and pt identifying meeting goals.  Discussed w/pt self care and maintain w/ her support system.  Discussed continued f/u w/ her psychiatrist and how to access services if needed in the future.  Plan: Pt is discharged from counseling as she feels she has met her goals.  Pt will continue as scheduled w/ Dr. [name].  Diagnosis: Dysthymic d/o    Outpatient Therapist Discharge Summary  Wendy Levy    12-21-1995   Admission Date: 01/31/12   Discharge Date:   12/11/18 Reason for Discharge:  Pt informed she has met her goal- no further needs at this time Diagnosis:    Dysthymic disorder    Comments:  Pt is eligible to return for counseling in the future.  Pt will continue w/ Dr. [name] as scheduled.  [place], [facility] 12/11/2018

## 2019-02-21 ENCOUNTER — Encounter (INDEPENDENT_AMBULATORY_CARE_PROVIDER_SITE_OTHER): Payer: BLUE CROSS/BLUE SHIELD

## 2019-02-28 ENCOUNTER — Ambulatory Visit (INDEPENDENT_AMBULATORY_CARE_PROVIDER_SITE_OTHER): Payer: BLUE CROSS/BLUE SHIELD | Admitting: Family Medicine

## 2019-03-08 ENCOUNTER — Encounter (HOSPITAL_COMMUNITY): Payer: Self-pay | Admitting: Psychiatry

## 2019-03-08 ENCOUNTER — Other Ambulatory Visit: Payer: Self-pay

## 2019-03-08 ENCOUNTER — Ambulatory Visit (INDEPENDENT_AMBULATORY_CARE_PROVIDER_SITE_OTHER): Payer: BC Managed Care – PPO | Admitting: Psychiatry

## 2019-03-08 DIAGNOSIS — F4310 Post-traumatic stress disorder, unspecified: Secondary | ICD-10-CM | POA: Diagnosis not present

## 2019-03-08 DIAGNOSIS — F99 Mental disorder, not otherwise specified: Secondary | ICD-10-CM

## 2019-03-08 DIAGNOSIS — F341 Dysthymic disorder: Secondary | ICD-10-CM | POA: Diagnosis not present

## 2019-03-08 DIAGNOSIS — F900 Attention-deficit hyperactivity disorder, predominantly inattentive type: Secondary | ICD-10-CM | POA: Diagnosis not present

## 2019-03-08 DIAGNOSIS — F5105 Insomnia due to other mental disorder: Secondary | ICD-10-CM | POA: Diagnosis not present

## 2019-03-08 MED ORDER — LISDEXAMFETAMINE DIMESYLATE 60 MG PO CAPS: 60.0000 mg | ORAL_CAPSULE | ORAL | 0 refills | Status: DC

## 2019-03-08 MED ORDER — PAROXETINE HCL 40 MG PO TABS: 40.0000 mg | ORAL_TABLET | Freq: Every day | ORAL | 0 refills | Status: DC

## 2019-03-08 MED ORDER — HYDROXYZINE HCL 25 MG PO TABS: 25.0000 mg | ORAL_TABLET | Freq: Every evening | ORAL | 0 refills | Status: DC | PRN

## 2019-03-08 NOTE — Progress Notes (Signed)
Virtual Visit via Telephone Note  I connected with Wendy Levy on 03/08/19 at 10:30 AM EDT by telephone and verified that I am speaking with the correct person using two identifiers.   I discussed the limitations, risks, security and privacy concerns of performing an evaluation and management service by telephone and the availability of in person appointments. I also discussed with the patient that there may be a patient responsible charge related to this service. The patient expressed understanding and agreed to proceed.   History of Present Illness: Wendy Levy tells me that she has been happy and cheerful lately.  She is having very mild mood swings.  Wendy Levy occasionally feels irritable but it is not as bad as it was before.  She is denying any angry outbursts.  She is staying with some friends and states that is going really well.  It is a very positive environment.  Her depression is very mild and most the time she does not feel down.  Her sleep is good and she is no longer waking up in the middle of the night.  The Vistaril is helping.  She is working on her diet and eating healthy foods and drinking water.  She is denying any SI/HI.  She denies any auditory and visual hallucinations.  She is denying any current symptoms of PTSD.  Her ADHD is well controlled with her Vyvanse.  She is attempting to shelter and places much as possible.  Her work hours were cut from 20 to hours a week to 1 shift per week.  She is currently looking for another job.   Observations/Objective: Wendy Levy is calm and cooperative on the phone today.  She has clear and coherent speech with normal volume, rate and tone.  Mood is euthymic and affect is restricted.  Thought processes are linear and coherent and overall intact.  Thought content is logical.  She denies any SI/HI and did not appear to be responding to internal stimuli.  Her attention and memory are good her fund of knowledge and language use are average.  Her insight  and judgment are good.  I am unable to comment on her general appearance, hygiene, eye contact or psychomotor activity due to not being able to physically see the patient.  Assessment and Plan: ADHD-combined type; oppositional defiant disorder; dysthymic disorder; PTSD; insomnia  Status of current symptoms- Stable  Continue Vyvanse 60 mg p.o. daily for ADHD Continue Paxil 40 mg p.o. daily for mood and anxiety Continue Vistaril 25 mg p.o. nightly as needed insomnia and anxiety    Follow Up Instructions: Follow up with me on June 07, 2019 at 11:30 AM   I discussed the assessment and treatment plan with the patient. The patient was provided an opportunity to ask questions and all were answered. The patient agreed with the plan and demonstrated an understanding of the instructions.   The patient was advised to call back or seek an in-person evaluation if the symptoms worsen or if the condition fails to improve as anticipated.  I provided 20 minutes of non-face-to-face time during this encounter.   Wendy Cradle, MD

## 2019-03-14 ENCOUNTER — Ambulatory Visit (INDEPENDENT_AMBULATORY_CARE_PROVIDER_SITE_OTHER): Payer: BLUE CROSS/BLUE SHIELD | Admitting: Family Medicine

## 2019-05-25 ENCOUNTER — Encounter (INDEPENDENT_AMBULATORY_CARE_PROVIDER_SITE_OTHER): Payer: Self-pay | Admitting: Family Medicine

## 2019-05-30 ENCOUNTER — Encounter (INDEPENDENT_AMBULATORY_CARE_PROVIDER_SITE_OTHER): Payer: Self-pay | Admitting: Family Medicine

## 2019-05-30 ENCOUNTER — Ambulatory Visit (INDEPENDENT_AMBULATORY_CARE_PROVIDER_SITE_OTHER): Payer: BC Managed Care – PPO | Admitting: Family Medicine

## 2019-05-30 ENCOUNTER — Other Ambulatory Visit: Payer: Self-pay

## 2019-05-30 VITALS — BP 114/76 | HR 85 | Temp 98.1°F | Ht 65.0 in | Wt 286.0 lb

## 2019-05-30 DIAGNOSIS — F341 Dysthymic disorder: Secondary | ICD-10-CM | POA: Diagnosis not present

## 2019-05-30 DIAGNOSIS — R0602 Shortness of breath: Secondary | ICD-10-CM

## 2019-05-30 DIAGNOSIS — R5383 Other fatigue: Secondary | ICD-10-CM

## 2019-05-30 DIAGNOSIS — Z1331 Encounter for screening for depression: Secondary | ICD-10-CM | POA: Diagnosis not present

## 2019-05-30 DIAGNOSIS — Z0289 Encounter for other administrative examinations: Secondary | ICD-10-CM

## 2019-05-30 DIAGNOSIS — Z6841 Body Mass Index (BMI) 40.0 and over, adult: Secondary | ICD-10-CM

## 2019-05-30 DIAGNOSIS — Z9189 Other specified personal risk factors, not elsewhere classified: Secondary | ICD-10-CM

## 2019-05-30 DIAGNOSIS — E559 Vitamin D deficiency, unspecified: Secondary | ICD-10-CM

## 2019-05-30 DIAGNOSIS — R7303 Prediabetes: Secondary | ICD-10-CM | POA: Diagnosis not present

## 2019-05-31 LAB — FOLATE: Folate: 11.6 ng/mL (ref >3.0)

## 2019-05-31 LAB — T4, FREE: Free T4: 0.96 ng/dL (ref 0.82–1.77)

## 2019-05-31 LAB — COMPREHENSIVE METABOLIC PANEL
ALT: 25 IU/L (ref 0–32)
AST: 20 IU/L (ref 0–40)
Albumin/Globulin Ratio: 1.7 (ref 1.2–2.2)
Albumin: 4.3 g/dL (ref 3.9–5.0)
Alkaline Phosphatase: 88 IU/L (ref 39–117)
BUN/Creatinine Ratio: 12 (ref 9–23)
BUN: 9 mg/dL (ref 6–20)
Bilirubin Total: 0.4 mg/dL (ref 0.0–1.2)
CO2: 20 mmol/L (ref 20–29)
Calcium: 9.4 mg/dL (ref 8.7–10.2)
Chloride: 104 mmol/L (ref 96–106)
Creatinine, Ser: 0.74 mg/dL (ref 0.57–1.00)
GFR calc Af Amer: 131 mL/min/{1.73_m2} (ref >59)
GFR calc non Af Amer: 114 mL/min/{1.73_m2} (ref >59)
Globulin, Total: 2.5 g/dL (ref 1.5–4.5)
Glucose: 65 mg/dL (ref 65–99)
Potassium: 4.1 mmol/L (ref 3.5–5.2)
Sodium: 141 mmol/L (ref 134–144)
Total Protein: 6.8 g/dL (ref 6.0–8.5)

## 2019-05-31 LAB — T3: T3, Total: 116 ng/dL (ref 71–180)

## 2019-05-31 LAB — INSULIN, RANDOM: INSULIN: 36.7 u[IU]/mL — ABNORMAL HIGH (ref 2.6–24.9)

## 2019-05-31 LAB — VITAMIN B12: Vitamin B-12: 349 pg/mL (ref 232–1245)

## 2019-05-31 NOTE — Progress Notes (Signed)
Office: [phone]  /  Fax: [phone]   Dear Harrison Mons, PA-C,   Thank you for referring Wendy Levy to our clinic. The following note includes my evaluation and treatment recommendations.  HPI:   Chief Complaint: OBESITY    Wendy Levy has been referred by Harrison Mons, PA-C for consultation regarding her obesity and obesity related comorbidities.    Wendy Levy (MR# [id number]) is a 23 y.o. female who presents on 05/30/2019 for obesity evaluation and treatment. Current BMI is Body mass index is 49.59 kg/m. Wendy Levy has been struggling with her weight for many years and has been unsuccessful in either losing weight, maintaining weight loss, or reaching her healthy weight goal.     Wendy Levy states she skips breakfast (not hungry). For lunch, around 1 pm she is doing Radiation protection practitioner sandwich (2 slices), with mayo on both sides, 2 pieces of bread and water (feels full until dinner). For dinner, she is doing steak 4-5 oz, mashed potatoes 1 cup, and corn (1 cup) and water (feels full). She is snacking on ice cream 1.5 cups.     Wendy Levy attended our information session and states she is currently in the action stage of change and ready to dedicate time achieving and maintaining a healthier weight. Wendy Levy is interested in becoming our patient and working on intensive lifestyle modifications including (but not limited to) diet, exercise and weight loss.    Wendy Levy states her family eats meals together she thinks her family will eat healthier with  her her desired weight loss is 164 lbs she started gaining weight in high school her heaviest weight ever was 297 lbs she is a picky eater and doesn't like to eat healthier foods  she has significant food cravings issues  she is frequently drinking liquids with calories she frequently makes poor food choices she has problems with excessive hunger  she frequently eats larger portions than normal  she struggles with emotional eating     Fatigue Wendy Levy feels her energy is lower than it should be. This has worsened with weight gain and has not worsened recently. Darcell admits to daytime somnolence and  admits to waking up still tired. Patient is at risk for obstructive sleep apnea. Patent has a history of symptoms of daytime fatigue and morning headache. Patient generally gets 7 hours of sleep per night, and states they generally have generally restful sleep. Snoring is present. Apneic episodes are present. Epworth Sleepiness Score is 9.  Dyspnea on exertion Wendy Levy notes increasing shortness of breath with exercising and seems to be worsening over time with weight gain. She notes getting out of breath sooner with activity than she used to. This has not gotten worse recently. EKG-normal sinus rhythm at 91 BPM. She has questions of hypopnea episodes. Gwendalynn denies orthopnea.  Dysthymic Disorder Wendy Levy has dysthymic disorder. She sees Dr. [name] at [facility].  Pre-Diabetes Wendy Levy has a diagnosis of pre-diabetes based on her elevated Hgb A1c and was informed this puts her at greater risk of developing diabetes. Last Hgb A1c was of 5.6 on 05/18/2019, and last insulin was of 65.5 on 11/16/18. She is not taking metformin currently and continues to work on diet and exercise to decrease risk of diabetes. She denies nausea or hypoglycemia.  At risk for diabetes Amanie is at higher than average risk for developing diabetes due to her obesity and pre-diabetes. She currently denies polyuria or polydipsia.  Vitamin D Deficiency Wendy Levy has a diagnosis of vitamin D deficiency. She is  currently taking prescription Vit D. Last Vit D level was of 24.0 on 05/18/2019. She notes fatigue and denies nausea, vomiting or muscle weakness.  Depression Screen Wendy Levy's Food and Mood (modified PHQ-9) score was  Depression screen PHQ 2/9 05/30/2019  Decreased Interest 0  Down, Depressed, Hopeless 1  PHQ - 2 Score 1   Altered sleeping 2  Tired, decreased energy 3  Change in appetite 1  Feeling bad or failure about yourself  2  Trouble concentrating 1  Moving slowly or fidgety/restless 1  Suicidal thoughts 0  PHQ-9 Score 11  Difficult doing work/chores Not difficult at all  Some encounter information is confidential and restricted. Go to Review Flowsheets activity to see all data.    ASSESSMENT AND PLAN:  Other fatigue - Plan: EKG 12-Lead, Folate, T3, T4, free, Ambulatory referral to Neurology  Shortness of breath on exertion - Plan: Folate, T3, T4, free, Ambulatory referral to Neurology  Dysthymic disorder - Plan: Folate, T3, T4, free  Prediabetes - Plan: Comprehensive metabolic panel, Insulin, random  Vitamin D deficiency - Plan: Vitamin B12  Depression screening  At risk for diabetes mellitus  Class 3 severe obesity with serious comorbidity and body mass index (BMI) of 45.0 to 49.9 in adult, unspecified obesity type (Wendy Levy)  PLAN:  Fatigue Shatona was informed that her fatigue may be related to obesity, depression or many other causes. Labs will be ordered, and in the meanwhile Nou has agreed to work on diet, exercise and weight loss to help with fatigue. Proper sleep hygiene was discussed including the need for 7-8 hours of quality sleep each night. A sleep study was not ordered based on symptoms and Epworth score.  Dyspnea on exertion Kyann's shortness of breath appears to be obesity related and exercise induced. She has agreed to work on weight loss and gradually increase exercise to treat her exercise induced shortness of breath. If Lexianna follows our instructions and loses weight without improvement of her shortness of breath, we will plan to refer to pulmonology. We will monitor this condition regularly. Linea agrees to this plan.  Dysthymic Disorder Wendy Levy is to follow up with Dr. [name] on July 9th. Wendy Levy agrees to follow up with our clinic in 2 weeks.  Pre-Diabetes  Wendy Levy will continue to work on weight loss, exercise, and decreasing simple carbohydrates in her diet to help decrease the risk of diabetes. We dicussed metformin including benefits and risks. She was informed that eating too many simple carbohydrates or too many calories at one sitting increases the likelihood of GI side effects. Wendy Levy declined metformin for now and a prescription was not written today. Wendy Levy agrees to follow up with our clinic in 2 weeks as directed to monitor her progress. We will check insulin level today. Wendy Levy agrees to follow up with our clinic in 2 weeks.  Vitamin D Deficiency Wendy Levy was informed that low vitamin D levels contributes to fatigue and are associated with obesity, breast, and colon cancer. Wendy Levy agrees to continue taking prescription Vit D 50,000 IU every week and will follow up for routine testing of vitamin D, at least 2-3 times per year. She was informed of the risk of over-replacement of vitamin D and agrees to not increase her dose unless she discusses this with [place] first. Chaslyn agrees to follow up with our clinic in 2 weeks.  Depression Screen Wendy Levy had a moderately positive depression screening. Depression is commonly associated with obesity and often results in emotional eating behaviors. We will monitor this  closely and work on CBT to help improve the non-hunger eating patterns. Referral to Psychology may be required if no improvement is seen as she continues in our clinic.  Obesity Wendy Levy is currently in the action stage of change and her goal is to continue with weight loss efforts. I recommend Wendy Levy begin the structured treatment plan as follows:  She has agreed to follow the Category 2 plan Wendy Levy has been instructed to eventually work up to a goal of 150 minutes of combined cardio and strengthening exercise per week for weight loss and overall health benefits. We discussed the following Behavioral Modification Strategies today:  increasing lean protein intake, increasing vegetables and work on meal planning and easy cooking plans, keeping healthy foods in the home, and planning for success   She was informed of the importance of frequent follow up visits to maximize her success with intensive lifestyle modifications for her multiple health conditions. She was informed we would discuss her lab results at her next visit unless there is a critical issue that needs to be addressed sooner. Anastasia agreed to keep her next visit at the agreed upon time to discuss these results.  ALLERGIES: Allergies  Allergen Reactions  . Benadryl Anti-Itch Childrens [Camphor] Rash    Rash with topical product    MEDICATIONS: Current Outpatient Medications on File Prior to Visit  Medication Sig Dispense Refill  . albuterol (PROVENTIL HFA;VENTOLIN HFA) 108 (90 Base) MCG/ACT inhaler Inhale 1-2 puffs into the lungs every 4 (four) hours as needed for wheezing or shortness of breath. 1 Inhaler 0  . baclofen (LIORESAL) 10 MG tablet Take 10 mg by mouth 2 (two) times daily.    . cetirizine (ZYRTEC) 10 MG tablet Take 10 mg by mouth daily as needed for allergies.    Marland Kitchen ergocalciferol (VITAMIN D2) 1.25 MG (50000 UT) capsule Take 50,000 Units by mouth once a week.    . hydrOXYzine (ATARAX/VISTARIL) 25 MG tablet Take 1 tablet (25 mg total) by mouth at bedtime as needed (sleep). 90 tablet 0  . lisdexamfetamine (VYVANSE) 60 MG capsule Take 1 capsule (60 mg total) by mouth every morning. 30 capsule 0  . medroxyPROGESTERone (DEPO-PROVERA) 400 MG/ML SUSP injection Inject into the muscle once.    Marland Kitchen PARoxetine (PAXIL) 40 MG tablet Take 1 tablet (40 mg total) by mouth daily. 90 tablet 0  . zonisamide (ZONEGRAN) 25 MG capsule TK 4 CS PO QD HS  1  . lisdexamfetamine (VYVANSE) 60 MG capsule Take 1 capsule (60 mg total) by mouth every morning. (Patient not taking: Reported on 05/30/2019) 30 capsule 0  . lisdexamfetamine (VYVANSE) 60 MG capsule Take 1 capsule (60 mg  total) by mouth every morning. 30 capsule 0   No current facility-administered medications on file prior to visit.     PAST MEDICAL HISTORY: Past Medical History:  Diagnosis Date  . ADHD (attention deficit hyperactivity disorder)   . Allergy   . Anxiety   . Asthma   . Constipation   . Depression   . Dysmenorrhea    Cramps periodically.   Marland Kitchen Dysthymic disorder   . History of chlamydia 2016  . Migraine headache   . Oppositional defiant disorder   . Pre-diabetes   . Shortness of breath   . Shoulder dislocation    bilateral  . Swelling of lower extremity   . Vitamin D deficiency   . Wrist fracture, bilateral     PAST SURGICAL HISTORY: Past Surgical History:  Procedure Laterality Date  .  WISDOM TOOTH EXTRACTION  04/2013    SOCIAL HISTORY: Social History   Tobacco Use  . Smoking status: Former Smoker    Types: Cigarettes  . Smokeless tobacco: Never Used  Substance Use Topics  . Alcohol use: Not Currently    Alcohol/week: 0.0 standard drinks    Comment: only on special occasions   . Drug use: No    FAMILY HISTORY: Family History  Adopted: Yes  Problem Relation Age of Onset  . Depression Sister        reportedly in and out of treatment facilities, also dx w/ RAD  . Suicidality Sister   . Obesity Mother     ROS: Review of Systems  Constitutional: Positive for malaise/fatigue. Negative for weight loss.       + Trouble sleeping  HENT: Positive for nosebleeds.        + Nasal stuffiness  Eyes: Positive for blurred vision, double vision and photophobia.  Respiratory: Positive for shortness of breath (with exertion).   Cardiovascular: Negative for orthopnea.       + Difficulty breathing while lying down + Sudden awakening from sleep with shortness of breath  Gastrointestinal: Positive for diarrhea. Negative for nausea and vomiting.  Genitourinary: Positive for frequency.  Musculoskeletal:       Negative muscle weakness + Neck stiffness  Neurological: Positive  for headaches.  Endo/Heme/Allergies: Positive for polydipsia.       Negative hypoglycemia  Psychiatric/Behavioral: Positive for depression. Negative for suicidal ideas. The patient is nervous/anxious.        + Stress    PHYSICAL EXAM: Blood pressure 114/76, pulse 85, temperature 98.1 F (36.7 C), temperature source Oral, height 5\' 5"  (1.651 m), SpO2 99 %. Body mass index is 49.59 kg/m. Physical Exam Vitals signs reviewed.  Constitutional:      Appearance: Normal appearance. She is obese.  HENT:     Head: Normocephalic and atraumatic.     Nose: Nose normal.  Eyes:     General: No scleral icterus.    Extraocular Movements: Extraocular movements intact.     Pupils: Pupils are equal, round, and reactive to light.  Neck:     Musculoskeletal: Normal range of motion and neck supple.     Comments: No thyromegaly present Cardiovascular:     Rate and Rhythm: Normal rate and regular rhythm.     Pulses: Normal pulses.     Heart sounds: Normal heart sounds.  Pulmonary:     Effort: Pulmonary effort is normal. No respiratory distress.     Breath sounds: Normal breath sounds.  Abdominal:     Palpations: Abdomen is soft.     Tenderness: There is no abdominal tenderness.     Comments: + Obesity  Musculoskeletal: Normal range of motion.     Right lower leg: No edema.     Left lower leg: No edema.  Skin:    General: Skin is warm and dry.  Neurological:     Mental Status: She is alert and oriented to person, place, and time.     Coordination: Coordination normal.  Psychiatric:        Mood and Affect: Mood normal.        Behavior: Behavior normal.     RECENT LABS AND TESTS: BMET    Component Value Date/Time   NA 141 05/30/2019 1555   K 4.1 05/30/2019 1555   CL 104 05/30/2019 1555   CO2 20 05/30/2019 1555   GLUCOSE 65 05/30/2019 1555   GLUCOSE 79  04/28/2016 1643   BUN 9 05/30/2019 1555   CREATININE 0.74 05/30/2019 1555   CREATININE 0.72 04/28/2016 1643   CALCIUM 9.4  05/30/2019 1555   GFRNONAA 114 05/30/2019 1555   GFRNONAA >89 02/12/2014 1003   GFRAA 131 05/30/2019 1555   GFRAA >89 02/12/2014 1003   Lab Results  Component Value Date   HGBA1C 5.2 02/12/2014   Lab Results  Component Value Date   INSULIN 36.7 (H) 05/30/2019   CBC    Component Value Date/Time   WBC 12.3 (H) 11/02/2017 1745   WBC 13.4 (H) 04/28/2016 1643   RBC 5.01 11/02/2017 1745   RBC 5.11 (H) 04/28/2016 1643   HGB 14.3 11/02/2017 1745   HCT 42.3 11/02/2017 1745   PLT 441 (H) 11/02/2017 1745   MCV 84 11/02/2017 1745   MCH 28.5 11/02/2017 1745   MCH 27.4 04/28/2016 1643   MCHC 33.8 11/02/2017 1745   MCHC 32.9 04/28/2016 1643   RDW 14.7 11/02/2017 1745   LYMPHSABS 3.2 (H) 11/02/2017 1745   MONOABS 804 04/28/2016 1643   EOSABS 0.2 11/02/2017 1745   BASOSABS 0.0 11/02/2017 1745   Iron/TIBC/Ferritin/ %Sat No results found for: IRON, TIBC, FERRITIN, IRONPCTSAT Lipid Panel     Component Value Date/Time   CHOL 209 (H) 04/28/2016 1643   TRIG 315 (H) 04/28/2016 1643   HDL 51 04/28/2016 1643   CHOLHDL 4.1 04/28/2016 1643   VLDL 63 (H) 04/28/2016 1643   LDLCALC 95 04/28/2016 1643   Hepatic Function Panel     Component Value Date/Time   PROT 6.8 05/30/2019 1555   ALBUMIN 4.3 05/30/2019 1555   AST 20 05/30/2019 1555   ALT 25 05/30/2019 1555   ALKPHOS 88 05/30/2019 1555   BILITOT 0.4 05/30/2019 1555      Component Value Date/Time   TSH 2.070 11/02/2017 1745   TSH 1.44 04/28/2016 1643   TSH 2.304 02/05/2015 1258    ECG  shows NSR with a rate of 91 BPM INDIRECT CALORIMETER done today shows a VO2 of 162 and a REE of 1132.  Her calculated basal metabolic rate is 1779 thus her basal metabolic rate is worse than expected.       OBESITY BEHAVIORAL INTERVENTION VISIT  Today's visit was # 1   Starting weight: 286 lbs Starting date: 05/30/2019 Today's weight : 286 lbs  Today's date: 05/30/2019 Total lbs lost to date: 0    ASK: We discussed the diagnosis of  obesity with Cardell Peach today and Evely agreed to give [place] permission to discuss obesity behavioral modification therapy today.  ASSESS: Aricka has the diagnosis of obesity and her BMI today is 47.59 Kazaria is in the action stage of change   ADVISE: Katana was educated on the multiple health risks of obesity as well as the benefit of weight loss to improve her health. She was advised of the need for long term treatment and the importance of lifestyle modifications to improve her current health and to decrease her risk of future health problems.  AGREE: Multiple dietary modification options and treatment options were discussed and  Fallon agreed to follow the recommendations documented in the above note.  ARRANGE: Zivah was educated on the importance of frequent visits to treat obesity as outlined per CMS and USPSTF guidelines and agreed to schedule her next follow up appointment today.  I, Trixie Dredge, am acting as transcriptionist for Ilene Qua, MD   I have reviewed the above documentation for accuracy and completeness, and I agree  with the above. - Ilene Qua, MD

## 2019-06-02 ENCOUNTER — Other Ambulatory Visit (HOSPITAL_COMMUNITY): Payer: Self-pay | Admitting: Psychiatry

## 2019-06-02 DIAGNOSIS — F341 Dysthymic disorder: Secondary | ICD-10-CM

## 2019-06-02 DIAGNOSIS — F4310 Post-traumatic stress disorder, unspecified: Secondary | ICD-10-CM

## 2019-06-05 ENCOUNTER — Encounter (INDEPENDENT_AMBULATORY_CARE_PROVIDER_SITE_OTHER): Payer: Self-pay | Admitting: Family Medicine
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
## 2019-06-05 NOTE — Telephone Encounter (Signed)
Please advise 

## 2019-06-06 ENCOUNTER — Encounter (INDEPENDENT_AMBULATORY_CARE_PROVIDER_SITE_OTHER): Payer: Self-pay | Admitting: Family Medicine

## 2019-06-06 ENCOUNTER — Other Ambulatory Visit: Payer: Self-pay

## 2019-06-06 ENCOUNTER — Ambulatory Visit (INDEPENDENT_AMBULATORY_CARE_PROVIDER_SITE_OTHER): Payer: BC Managed Care – PPO | Admitting: Psychology

## 2019-06-06 DIAGNOSIS — F341 Dysthymic disorder: Secondary | ICD-10-CM | POA: Diagnosis not present

## 2019-06-07 ENCOUNTER — Other Ambulatory Visit: Payer: Self-pay

## 2019-06-07 ENCOUNTER — Encounter (HOSPITAL_COMMUNITY): Payer: Self-pay | Admitting: Psychiatry

## 2019-06-07 ENCOUNTER — Ambulatory Visit (INDEPENDENT_AMBULATORY_CARE_PROVIDER_SITE_OTHER): Payer: BC Managed Care – PPO | Admitting: Psychiatry

## 2019-06-07 DIAGNOSIS — F99 Mental disorder, not otherwise specified: Secondary | ICD-10-CM

## 2019-06-07 DIAGNOSIS — F341 Dysthymic disorder: Secondary | ICD-10-CM | POA: Diagnosis not present

## 2019-06-07 DIAGNOSIS — F4310 Post-traumatic stress disorder, unspecified: Secondary | ICD-10-CM | POA: Diagnosis not present

## 2019-06-07 DIAGNOSIS — F913 Oppositional defiant disorder: Secondary | ICD-10-CM

## 2019-06-07 DIAGNOSIS — F5105 Insomnia due to other mental disorder: Secondary | ICD-10-CM

## 2019-06-07 DIAGNOSIS — F900 Attention-deficit hyperactivity disorder, predominantly inattentive type: Secondary | ICD-10-CM

## 2019-06-07 MED ORDER — PAROXETINE HCL 40 MG PO TABS: 40.0000 mg | ORAL_TABLET | Freq: Every day | ORAL | 0 refills | Status: DC

## 2019-06-07 MED ORDER — HYDROXYZINE HCL 25 MG PO TABS: 25.0000 mg | ORAL_TABLET | Freq: Every evening | ORAL | 0 refills | Status: DC | PRN

## 2019-06-07 NOTE — Telephone Encounter (Signed)
Please advise 

## 2019-06-07 NOTE — Progress Notes (Signed)
Virtual Visit via Telephone Note  I connected with Wendy Levy on 06/07/19 at 11:30 AM EDT by telephone and verified that I am speaking with the correct person using two identifiers.  Location: Patient: home Provider: office   I discussed the limitations, risks, security and privacy concerns of performing an evaluation and management service by telephone and the availability of in person appointments. I also discussed with the patient that there may be a patient responsible charge related to this service. The patient expressed understanding and agreed to proceed.   History of Present Illness: "Everything is good. I live in [place]". She is living with her dad and moved in about 2 weeks ago. Since moving her depression has improved. She is feeling a lot happier. She had a few random panic attacks. Sleep remains poor. She still wakes up during the night and is unable to fall back asleep. She has an appt for sleep study next week. Pt denies SI/HI. [name] denies any angry outbursts. PTSD has improved. She denies nightmares and HV. Flashbacks have decreased in frequency. ADHD is worse since she has no routine and no work. She is doing some things around the house and city to keep busy.       Observations/Objective: I spoke with Wendy Levy on the phone.  Pt was calm, pleasant and cooperative.  Pt was engaged in the conversation and answered questions appropriately.  Speech was clear and coherent with normal rate, tone and volume.  Mood is mildly depressed and anxious, affect is brighter than at previous visits. Thought processes are coherent and intact.  Thought content is logical.  Pt denies SI/HI.   Pt denies auditory and visual hallucinations and did not appear to be responding to internal stimuli.  Memory and concentration are good.  Fund of knowledge and use of language are average.  Insight and judgment are fair.  I am unable to comment on psychomotor activity, general appearance, hygiene, or  eye contact as I was unable to physically see the patient on the phone.   Assessment and Plan: ADHD-combined type; ODD; Dysthymic d/o; PTSD; Insomnia  Vistaril 25mg  po qHS prn insomnia Vyvanse 60mg  po qAM- not currently taking Paxil 40mg  po qD  Follow Up Instructions: In 3 months or sooner if needed   I discussed the assessment and treatment plan with the patient. The patient was provided an opportunity to ask questions and all were answered. The patient agreed with the plan and demonstrated an understanding of the instructions.   The patient was advised to call back or seek an in-person evaluation if the symptoms worsen or if the condition fails to improve as anticipated.  I provided 15 minutes of non-face-to-face time during this encounter.   [name], MD

## 2019-06-12 ENCOUNTER — Ambulatory Visit: Payer: BC Managed Care – PPO | Admitting: Neurology

## 2019-06-12 ENCOUNTER — Other Ambulatory Visit: Payer: Self-pay

## 2019-06-12 ENCOUNTER — Encounter: Payer: Self-pay | Admitting: Neurology

## 2019-06-12 VITALS — BP 128/75 | HR 83 | Temp 98.4°F | Ht 64.0 in | Wt 293.0 lb

## 2019-06-12 DIAGNOSIS — F341 Dysthymic disorder: Secondary | ICD-10-CM

## 2019-06-12 DIAGNOSIS — F902 Attention-deficit hyperactivity disorder, combined type: Secondary | ICD-10-CM

## 2019-06-12 DIAGNOSIS — G4719 Other hypersomnia: Secondary | ICD-10-CM

## 2019-06-12 DIAGNOSIS — R0683 Snoring: Secondary | ICD-10-CM

## 2019-06-12 DIAGNOSIS — F332 Major depressive disorder, recurrent severe without psychotic features: Secondary | ICD-10-CM | POA: Diagnosis not present

## 2019-06-12 DIAGNOSIS — Z6841 Body Mass Index (BMI) 40.0 and over, adult: Secondary | ICD-10-CM

## 2019-06-12 NOTE — Patient Instructions (Signed)

## 2019-06-12 NOTE — Progress Notes (Signed)
SLEEP MEDICINE CLINIC    Provider:  Larey Seat, MD  Primary Care Physician:  H[facility] Mons, [place] [id number]     Referring Provider: Dr. Adair Patter, MD          Chief Complaint according to patient   Patient presents with:    . New Patient (Initial Visit)           HISTORY OF PRESENT ILLNESS:  Wendy Levy is a 23 y.o. year old White or Caucasian female patient seen here as a referral on 06/12/2019 from Dr. Adair Patter for a sleep consultation.      I have the pleasure of seeing Wendy Levy today, a right -handed White or Caucasian female with a possible sleep disorder.   She has a  has a past medical history of ADHD (attention deficit hyperactivity disorder), Allergy, Anxiety, Asthma, Constipation, Depression, Dysmenorrhea, Dysthymic disorder, History of chlamydia (2016), Migraine headache, Oppositional defiant disorder, Pre-diabetes, Shortness of breath, Shoulder dislocation, Swelling of lower extremity, Vitamin D deficiency, and Wrist fracture, bilateral.. She has been treated for morbid obesity.   The patient had no previous sleep study.   Sleep relevant medical history:  Parents and boyfriend have noted her to stop breathing. She snores.  She is treated for depression, anxiety and has cyclic sleep problems.  Weight gain- related to depression, and biological parents are obese. She has biological siblings , 4 sisters , 80 brothers. She is now living with her biological father.  he states she snores. She feels tired.  Family medical /sleep history: she was adopted. Left her adopted family 3 years ago- became homeless. No other family member on CPAP with OSA, insomnia, sleep walking.    Social history: Patient is unemployed , she is a Education officer, community, HS graduate,  and lives in a household with 3 other persons - parents and boyfriend. .  Family status is single. The patient currently is unemployed.  Pets are present, one cat, 5  dogs .Tobacco use: none , quit at age 54, but exposed to second hand smoke. ETOH use : rarely,  Caffeine intake in form of Coffee( none) Soda( " a lot - 4 cans a day) Tea (in restaurants ) or energy drinks.No regular exercise , she just started to walk. Hobbies  photoraphy , arts and craft.      Sleep habits are as follows: The patient's dinner time varies- her mother works late and at night- on off days  between 5.30 PM.  The patient goes to bed at 10 PM- and it" takes 60 minutes" for her "meds to kick in". And continues to sleep for 2 hours, wakes at 1 AM and is not sure why- has racing thoughts , but she is not worriing a lot.  Rare bathroom breaks. Bedroom is cool, quiet and dark.  The preferred sleep position is supine and lateral , with the support of 3 pillows. Dreams are reportedly rare now on meds.    8.30  AM is the usual rise time. The patient wakes up spontaneously 8.30 , not with an alarm.   She reports not feeling refreshed or restored in AM, with symptoms such as dry mouth, morning headaches, dull headaches,  and residual fatigue. " I could sleep the whole day". Naps are taken in frequently, she tries to avoid them- lasting  2 hours and are more refreshing than nocturnal sleep.    Review of Systems: Out of a complete 14 system  review, the patient complains of only the following symptoms, and all other reviewed systems are negative.:  Fatigue, sleepiness , snoring, fragmented sleep, Insomnia in clusters.  " headaches are treated ' by Dr Domingo Cocking.    How likely are you to doze in the following situations: 0 = not likely, 1 = slight chance, 2 = moderate chance, 3 = high chance   Sitting and Reading? Watching Television? Sitting inactive in a public place (theater or meeting)? As a passenger in a car for an hour without a break? Lying down in the afternoon when circumstances permit? Sitting and talking to someone? Sitting quietly after lunch without alcohol? In a car, while  stopped for a few minutes in traffic?   Total = 17/ / 24 points   FSS endorsed at 36 / 63 points.   Social History   Socioeconomic History  . Marital status: Single    Spouse name: n/a  . Number of children: 0  . Years of education: Not on file  . Highest education level: Not on file  Occupational History  . Occupation: Education officer, community for [employer]  . Financial resource strain: Somewhat hard  . Food insecurity    Worry: Never true    Inability: Never true  . Transportation needs    Medical: No    Non-medical: No  Tobacco Use  . Smoking status: Never Smoker  . Smokeless tobacco: Never Used  Substance and Sexual Activity  . Alcohol use: Not Currently    Alcohol/week: 0.0 standard drinks    Comment: only on special occasions   . Drug use: No  . Sexual activity: Yes    Partners: Male    Birth control/protection: Injection    Comment: 03/29/17  Lifestyle  . Physical activity    Days per week: 0 days    Minutes per session: 0 min  . Stress: Very much  Relationships  . Social Herbalist on phone: Once a week    Gets together: More than three times a week    Attends religious service: 1 to 4 times per year    Active member of club or organization: No    Attends meetings of clubs or organizations: Never    Relationship status: Never married  Other Topics Concern  . Not on file  Social History Narrative   Lives with her mother (adopted) and significant other.  Her sister lives in [place], [place] with her nephew Wendy Levy, born 12/31/2014.   Graduated from [employer].    Family History  Adopted: Yes  Problem Relation Age of Onset  . Depression Sister        reportedly in and out of treatment facilities, also dx w/ RAD  . Suicidality Sister   . Obesity Mother     Past Medical History:  Diagnosis Date  . ADHD (attention deficit hyperactivity disorder)   . Allergy   . Anxiety   . Asthma   . Constipation   . Depression   .  Dysmenorrhea    Cramps periodically.   Marland Kitchen Dysthymic disorder   . History of chlamydia 2016  . Migraine headache   . Oppositional defiant disorder   . Pre-diabetes   . Shortness of breath   . Shoulder dislocation    bilateral  . Swelling of lower extremity   . Vitamin D deficiency   . Wrist fracture, bilateral     Past Surgical History:  Procedure Laterality Date  . WISDOM  TOOTH EXTRACTION  04/2013     Current Outpatient Medications on File Prior to Visit  Medication Sig Dispense Refill  . albuterol (PROVENTIL HFA;VENTOLIN HFA) 108 (90 Base) MCG/ACT inhaler Inhale 1-2 puffs into the lungs every 4 (four) hours as needed for wheezing or shortness of breath. 1 Inhaler 0  . baclofen (LIORESAL) 10 MG tablet Take 10 mg by mouth 2 (two) times daily.    . cetirizine (ZYRTEC) 10 MG tablet Take 10 mg by mouth daily as needed for allergies.    Marland Kitchen ergocalciferol (VITAMIN D2) 1.25 MG (50000 UT) capsule Take 50,000 Units by mouth once a week.    . hydrOXYzine (ATARAX/VISTARIL) 25 MG tablet Take 1 tablet (25 mg total) by mouth at bedtime as needed (sleep). 90 tablet 0  . lisdexamfetamine (VYVANSE) 60 MG capsule Take 1 capsule (60 mg total) by mouth every morning. 30 capsule 0  . medroxyPROGESTERone (DEPO-PROVERA) 400 MG/ML SUSP injection Inject into the muscle once.    Marland Kitchen PARoxetine (PAXIL) 40 MG tablet Take 1 tablet (40 mg total) by mouth daily. 90 tablet 0  . zonisamide (ZONEGRAN) 25 MG capsule TK 4 CS PO QD HS  1   No current facility-administered medications on file prior to visit.     Allergies  Allergen Reactions  . Camphor Rash    Rash with topical product Rash with topical product    Physical exam:  Today's Vitals   06/12/19 1300  BP: 128/75  Pulse: 83  Temp: 98.4 F (36.9 C)  Weight: 293 lb (132.9 kg)  Height: 5\' 4"  (1.626 m)   Body mass index is 50.29 kg/m.   Wt Readings from Last 3 Encounters:  06/12/19 293 lb (132.9 kg)  05/30/19 286 lb (129.7 kg)  05/18/18 290 lb  (131.5 kg)     Ht Readings from Last 3 Encounters:  06/12/19 5\' 4"  (1.626 m)  05/30/19 5\' 5"  (1.651 m)  05/18/18 5\' 4"  (1.626 m)      General: The patient is awake, alert and appears not in acute distress. She is obese, tattooed, cleanly dressed. .  Head: Normocephalic, atraumatic.  Neck  Mallampati 3,  neck circumference:20 inches . Nasal airflow congested .   Retrognathia is seen.   Cardiovascular:  Regular rate and cardiac rhythm by pulse,  without distended neck veins. Respiratory: Lungs are clear to auscultation.  Skin:  Without evidence of ankle edema, or rash. Trunk: The patient's posture is relaxed but her foot is constantly tapping.   Neurologic exam : The patient is awake and alert, oriented to place and time.   Memory subjective described as intact.  Attention span & concentration ability appears normal.  Speech is fluent,  without  dysarthria, dysphonia or aphasia.  Mood and affect are appropriate.   Cranial nerves: no loss of smell or taste reported  Pupils are equal and briskly reactive to light. Funduscopic exam deferred.  Extraocular movements in vertical and horizontal planes were intact but there is  Nystagmus. Astigmatism . No Diplopia. Visual fields by finger perimetry are intact. Hearing was intact to soft voice and finger rubbing.    Facial sensation intact to fine touch.  Facial motor strength is symmetric and tongue and uvula move midline.  Neck ROM : rotation, tilt and flexion extension were normal for age and shoulder shrug was symmetrical.    Motor exam:  Symmetric bulk, tone and ROM.   Normal tone without cog wheeling, symmetric grip strength .   Sensory:  Fine touch, pinprick  and vibration were tested  and  normal.  Proprioception tested in the upper extremities was normal. Can feel vibration in both big toes.    Coordination: Rapid alternating movements in the fingers/hands were of normal speed.  The Finger-to-nose maneuver was intact without  evidence of ataxia, dysmetria or tremor.    Gait and station: Patient could rise unassisted from a seated position, walked without assistive device.  Stance is of normal width/ base and the patient turned with 3 steps.  Toe and heel walk were deferred.  Deep tendon reflexes: in the  upper and lower extremities are symmetric and intact.  Babinski response was deferred.      After spending a total time of  40  minutes face to face and additional time for physical and neurologic examination, review of laboratory studies,  personal review of imaging studies, reports and results of other testing and review of referral information / records as far as provided in visit, I have established the following assessments:  1)  OSA risk factors are there- weight and BMI over 50, short, steep airway anatomy,large neck at 20" circumference.   2) EDS can be related to psychiatric disorder , the medication treated the depression and anxiety, ODD, ADD/ ADHD>   3) dreamless sleep, not refreshing related to OSA, to medication? Not likely to reflect narcolepsy.    My Plan is to proceed with:  1)  Attended sleep study with [facility] at [facility].  2) bring medication for sleep with you and take them once you are in the bed and have not been able to initiate sleep without- , tell your tech what you take.   3) Psycyhiatric care to address insomnia- if non organic.   I would like to thank Dr Adair Patter, MD and [facility], [address],  [place] [id number] for allowing me to meet with and to take care of this pleasant patient.   In short, Wendy Levy is scheduled for a sleep study , [facility] night protocol.   Electronically signed by: Larey Seat, MD 06/12/2019 1:07 PM  [facility] Board certified by The [name] of Sleep Medicine  and Diplomate of the [employer] of Sleep Medicine. Board certified In Neurology through the [employer],   Fellow of the [employer] of Neurology. Medical Director of [facility].

## 2019-06-13 ENCOUNTER — Encounter (INDEPENDENT_AMBULATORY_CARE_PROVIDER_SITE_OTHER): Payer: Self-pay | Admitting: Family Medicine

## 2019-06-13 ENCOUNTER — Ambulatory Visit (INDEPENDENT_AMBULATORY_CARE_PROVIDER_SITE_OTHER): Payer: BC Managed Care – PPO | Admitting: Family Medicine

## 2019-06-13 ENCOUNTER — Other Ambulatory Visit: Payer: Self-pay

## 2019-06-13 VITALS — BP 113/71 | HR 91 | Temp 98.2°F | Ht 64.0 in | Wt 291.0 lb

## 2019-06-13 DIAGNOSIS — Z9189 Other specified personal risk factors, not elsewhere classified: Secondary | ICD-10-CM | POA: Diagnosis not present

## 2019-06-13 DIAGNOSIS — E559 Vitamin D deficiency, unspecified: Secondary | ICD-10-CM | POA: Diagnosis not present

## 2019-06-13 DIAGNOSIS — R7303 Prediabetes: Secondary | ICD-10-CM

## 2019-06-13 DIAGNOSIS — Z6841 Body Mass Index (BMI) 40.0 and over, adult: Secondary | ICD-10-CM

## 2019-06-13 MED ORDER — METFORMIN HCL 500 MG PO TABS: 500.0000 mg | ORAL_TABLET | Freq: Every day | ORAL | 0 refills | Status: DC

## 2019-06-13 NOTE — Progress Notes (Signed)
Office: [phone]  /  Fax: [phone]   HPI:   Chief Complaint: OBESITY Wendy Levy is here to discuss her progress with her obesity treatment plan. She is on the Category 2 plan and is following her eating plan approximately 25% of the time. She states she is walking 60 minutes 7 times per week. Wendy Levy wants to keep her meal plan as is. She is doing Nutri grain bars for snacks. She states she is not measuring her dinner meat or measuring her vegetables. She reports continuing to drink 2 cans of soda a day. Her weight is 291 lb (132 kg) today and has had a weight gain of 5 lbs since her last visit. She has lost 0 lbs since starting treatment with [place].  Pre-Diabetes Wendy Levy has a diagnosis of prediabetes based on her elevated Hgb A1c and was informed this puts her at greater risk of developing diabetes. She has a HgA1c of 5.5 and an insulin of 36.7 (previously had an insulin of greater than 60).  She is not taking metformin currently and continues to work on diet and exercise to decrease risk of diabetes. She is on Depo consistently for birth control. She reports cravings for soda.  At risk for diabetes Wendy Levy is at higher than average risk for developing diabetes due to her obesity. She currently denies polyuria or polydipsia.  Vitamin D deficiency Wendy Levy has a diagnosis of Vitamin D deficiency. Her last Vitamin D level was reported to be 24.0. She is currently taking Vit D 50,000 IU weekly and denies nausea, vomiting or muscle weakness.  ASSESSMENT AND PLAN:  Prediabetes  Vitamin D deficiency  At risk for diabetes mellitus  Class 3 severe obesity with serious comorbidity and body mass index (BMI) of 45.0 to 49.9 in adult, unspecified obesity type ([place])  PLAN:  Pre-Diabetes Wendy Levy will continue to work on weight loss, exercise, and decreasing simple carbohydrates in her diet to help decrease the risk of diabetes. We dicussed metformin including benefits and risks. She was  informed that eating too many simple carbohydrates or too many calories at one sitting increases the likelihood of GI side effects. Wendy Levy will start metformin 500 mg PO QAM #30 with 0 refills. She agrees to follow-up with our clinic in 2 weeks.  Diabetes risk counseling Wendy Levy was given extended (30 minutes) diabetes prevention counseling today. She is 23 y.o. female and has risk factors for diabetes including obesity. We discussed intensive lifestyle modifications today with an emphasis on weight loss as well as increasing exercise and decreasing simple carbohydrates in her diet.  Vitamin D Deficiency Wendy Levy was informed that low Vitamin D levels contributes to fatigue and are associated with obesity, breast, and colon cancer. She will have a repeat Vitamin D level in 3 months. She was informed of the risk of over-replacement of Vitamin D and agrees to not increase her dose unless she discusses this with [place] first. Wendy Levy agrees to follow-up with our clinic in 2 weeks.  Obesity Wendy Levy is currently in the action stage of change. As such, her goal is to continue with weight loss efforts. She has agreed to follow the Category 2 plan. Wendy Levy has been instructed to work up to a goal of 150 minutes of combined cardio and strengthening exercise per week for weight loss and overall health benefits. We discussed the following Behavioral Modification Strategies today: increasing lean protein intake, increasing vegetables, work on meal planning and easy cooking plans, keeping healthy foods in the home, and planning for  success.  Wendy Levy has agreed to follow-up with our clinic in 2 weeks. She was informed of the importance of frequent follow-up visits to maximize her success with intensive lifestyle modifications for her multiple health conditions.  ALLERGIES: Allergies  Allergen Reactions  . Camphor Rash    Rash with topical product Rash with topical product    MEDICATIONS: Current Outpatient  Medications on File Prior to Visit  Medication Sig Dispense Refill  . albuterol (PROVENTIL HFA;VENTOLIN HFA) 108 (90 Base) MCG/ACT inhaler Inhale 1-2 puffs into the lungs every 4 (four) hours as needed for wheezing or shortness of breath. 1 Inhaler 0  . baclofen (LIORESAL) 10 MG tablet Take 10 mg by mouth 2 (two) times daily.    . cetirizine (ZYRTEC) 10 MG tablet Take 10 mg by mouth daily as needed for allergies.    Marland Kitchen ergocalciferol (VITAMIN D2) 1.25 MG (50000 UT) capsule Take 50,000 Units by mouth once a week.    . hydrOXYzine (ATARAX/VISTARIL) 25 MG tablet Take 1 tablet (25 mg total) by mouth at bedtime as needed (sleep). 90 tablet 0  . lisdexamfetamine (VYVANSE) 60 MG capsule Take 1 capsule (60 mg total) by mouth every morning. 30 capsule 0  . medroxyPROGESTERone (DEPO-PROVERA) 400 MG/ML SUSP injection Inject into the muscle once.    Marland Kitchen PARoxetine (PAXIL) 40 MG tablet Take 1 tablet (40 mg total) by mouth daily. 90 tablet 0  . zonisamide (ZONEGRAN) 25 MG capsule TK 4 CS PO QD HS  1   No current facility-administered medications on file prior to visit.     PAST MEDICAL HISTORY: Past Medical History:  Diagnosis Date  . ADHD (attention deficit hyperactivity disorder)   . Allergy   . Anxiety   . Asthma   . Constipation   . Depression   . Dysmenorrhea    Cramps periodically.   Marland Kitchen Dysthymic disorder   . History of chlamydia 2016  . Migraine headache   . Oppositional defiant disorder   . Pre-diabetes   . Shortness of breath   . Shoulder dislocation    bilateral  . Swelling of lower extremity   . Vitamin D deficiency   . Wrist fracture, bilateral     PAST SURGICAL HISTORY: Past Surgical History:  Procedure Laterality Date  . WISDOM TOOTH EXTRACTION  04/2013    SOCIAL HISTORY: Social History   Tobacco Use  . Smoking status: Never Smoker  . Smokeless tobacco: Never Used  Substance Use Topics  . Alcohol use: Not Currently    Alcohol/week: 0.0 standard drinks    Comment: only  on special occasions   . Drug use: No    FAMILY HISTORY: Family History  Adopted: Yes  Problem Relation Age of Onset  . Depression Sister        reportedly in and out of treatment facilities, also dx w/ RAD  . Suicidality Sister   . Obesity Mother    ROS: Review of Systems  Gastrointestinal: Negative for nausea and vomiting.  Musculoskeletal:       Negative for muscle weakness.   PHYSICAL EXAM: Blood pressure 113/71, pulse 91, temperature 98.2 F (36.8 C), temperature source Oral, height 5\' 4"  (1.626 m), weight 291 lb (132 kg), SpO2 96 %. Body mass index is 49.95 kg/m. Physical Exam Vitals signs reviewed.  Constitutional:      Appearance: Normal appearance. She is obese.  Cardiovascular:     Rate and Rhythm: Normal rate.     Pulses: Normal pulses.  Pulmonary:  Effort: Pulmonary effort is normal.     Breath sounds: Normal breath sounds.  Musculoskeletal: Normal range of motion.  Skin:    General: Skin is warm and dry.  Neurological:     Mental Status: She is alert and oriented to person, place, and time.  Psychiatric:        Behavior: Behavior normal.   RECENT LABS AND TESTS: BMET    Component Value Date/Time   NA 141 05/30/2019 1555   K 4.1 05/30/2019 1555   CL 104 05/30/2019 1555   CO2 20 05/30/2019 1555   GLUCOSE 65 05/30/2019 1555   GLUCOSE 79 04/28/2016 1643   BUN 9 05/30/2019 1555   CREATININE 0.74 05/30/2019 1555   CREATININE 0.72 04/28/2016 1643   CALCIUM 9.4 05/30/2019 1555   GFRNONAA 114 05/30/2019 1555   GFRNONAA >89 02/12/2014 1003   GFRAA 131 05/30/2019 1555   GFRAA >89 02/12/2014 1003   Lab Results  Component Value Date   HGBA1C 5.2 02/12/2014   Lab Results  Component Value Date   INSULIN 36.7 (H) 05/30/2019   CBC    Component Value Date/Time   WBC 12.3 (H) 11/02/2017 1745   WBC 13.4 (H) 04/28/2016 1643   RBC 5.01 11/02/2017 1745   RBC 5.11 (H) 04/28/2016 1643   HGB 14.3 11/02/2017 1745   HCT 42.3 11/02/2017 1745   PLT 441  (H) 11/02/2017 1745   MCV 84 11/02/2017 1745   MCH 28.5 11/02/2017 1745   MCH 27.4 04/28/2016 1643   MCHC 33.8 11/02/2017 1745   MCHC 32.9 04/28/2016 1643   RDW 14.7 11/02/2017 1745   LYMPHSABS 3.2 (H) 11/02/2017 1745   MONOABS 804 04/28/2016 1643   EOSABS 0.2 11/02/2017 1745   BASOSABS 0.0 11/02/2017 1745   Iron/TIBC/Ferritin/ %Sat No results found for: IRON, TIBC, FERRITIN, IRONPCTSAT Lipid Panel     Component Value Date/Time   CHOL 209 (H) 04/28/2016 1643   TRIG 315 (H) 04/28/2016 1643   HDL 51 04/28/2016 1643   CHOLHDL 4.1 04/28/2016 1643   VLDL 63 (H) 04/28/2016 1643   LDLCALC 95 04/28/2016 1643   Hepatic Function Panel     Component Value Date/Time   PROT 6.8 05/30/2019 1555   ALBUMIN 4.3 05/30/2019 1555   AST 20 05/30/2019 1555   ALT 25 05/30/2019 1555   ALKPHOS 88 05/30/2019 1555   BILITOT 0.4 05/30/2019 1555      Component Value Date/Time   TSH 2.070 11/02/2017 1745   TSH 1.44 04/28/2016 1643   TSH 2.304 02/05/2015 1258   No results found for: Vitamin D  OBESITY BEHAVIORAL INTERVENTION VISIT  Today's visit was #2  Starting weight: 286 lbs Starting date: 05/30/2019 Today's weight: 291 lbs  Today's date: 06/13/2019 Total lbs lost to date: 0   06/13/2019  Height 5\' 4"  (1.626 m)  Weight 291 lb (132 kg)  BMI (Calculated) 49.93  BLOOD PRESSURE - SYSTOLIC 341  BLOOD PRESSURE - DIASTOLIC 71   Body Fat % 93.7 %  Total Body Water (lbs) 103.5 lbs   ASK: We discussed the diagnosis of obesity with Cardell Peach today and Veleria agreed to give [place] permission to discuss obesity behavioral modification therapy today.  ASSESS: Anamae has the diagnosis of obesity and her BMI today is 48.4. Darrin is in the action stage of change.   ADVISE: Avaiyah was educated on the multiple health risks of obesity as well as the benefit of weight loss to improve her health. She was advised of the  need for long term treatment and the importance of lifestyle modifications  to improve her current health and to decrease her risk of future health problems.  AGREE: Multiple dietary modification options and treatment options were discussed and  Kataleena agreed to follow the recommendations documented in the above note.  ARRANGE: Rhona was educated on the importance of frequent visits to treat obesity as outlined per CMS and USPSTF guidelines and agreed to schedule her next follow up appointment today.  I, Michaelene Song, am acting as transcriptionist for Ilene Qua, MD  I have reviewed the above documentation for accuracy and completeness, and I agree with the above. - Ilene Qua, MD

## 2019-06-21 NOTE — Progress Notes (Signed)
Office: [phone]  /  Fax: [phone]    Date: June 25, 2019   Appointment Start Time: 10:04am Duration: 27 minutes Provider: Glennie Isle, Psy.D. Type of Session: Individual Therapy  Location of Patient: Home Location of Provider: Provider's Home  Type of Contact: Telepsychological Visit via [employer] Web[employer]   Session Content: Wendy Levy is a 23 y.o. female presenting via [employer] for a follow-up appointment to address the previously established treatment goal of decreasing emotional eating. Of note, this provider called [name] at 10:02am as she did not present for today's appointment. She indicated she could not locate the e-mail; therefore, the e-mail with the secure link was re-sent. As such, today's appointment was initiated 4 minutes late. Today's appointment was a telepsychological visit, as this provider's clinic is seeing a limited number of patients for in-person visits due to COVID-19. Therapeutic services will resume to in-person appointments once deemed appropriate. Chalsey expressed understanding regarding the rationale for telepsychological services, and provided verbal consent for today's appointment. Prior to proceeding with today's appointment, [name]'s physical location at the time of this appointment was obtained. [name] reported she was at home and provided the address. In the event of technical difficulties, [name] shared a phone number she could be reached at. [name] and this provider participated in today's telepsychological service. Also, [name] denied anyone else being present in the room or on the WebEx appointment.  This provider conducted a brief check-in and verbally administered the PHQ-9 and GAD-7. [name] shared things are "alright." She indicated her dog passed away, but she has a new dog now. Associated thoughts and feelings were processed. She further shared, "I've been doing everything I should be doing." A risk assessment was completed. [name] denied  experiencing suicidal and homicidal ideation, plan, and intent since the last appointment with this provider. She reported understanding the importance of having easy access to the developed safety plan, and continues to acknowledge understanding regarding the importance of reaching out to trusted individuals and/or emergency resources if she is unable to ensure safety. She was also observed nodding her head in agreement. Blenda stated she met with her psychiatrist, Dr. [name]. She noted, "It was good" and "I had all my medications refilled." This provider inquired about the completion of the authorization forms. She noted, "I just seen them." Thus, directions were provided to complete and return the authorizations. Moreover, [name] does not believe she is currently engaging in emotional eating. Psychoeducation regarding emotional versus physical hunger was provided. [name] was sent a handout to utilize between now and the next appointment to increase awareness of hunger patterns and subsequent eating. [name] provided verbal consent during today's appointment for this provider to send the handout via e-mail.   Furthermore, [name] discussed experiencing "guilt" secondary to her dog's passing. This was explored further. She stated the guilt is secondary to feeling as though she did not do enough. This provider again recommended longer-term therapeutic services. This provider also explained the focus of treatment with this provider is only eating and a higher level of care is recommended. [name] noted, "I have family to help me with that." She again shared, "I'm still not ready for therapy." This was explored further. She shared she wants a "break from therapy," but was agreeable to meeting with this provider to address emotional eating. Overall, [name] was receptive to today's session as evidenced by openness to sharing, responsiveness to feedback, and discuss physical and emotional hunger  patterns.  Mental Status Examination:  Appearance: neat Behavior: cooperative Mood: euthymic  Affect: mood congruent Speech: normal in rate, volume, and tone Eye Contact: appropriate Psychomotor Activity: appropriate Thought Process: linear, logical, and goal directed  Content/Perceptual Disturbances: denies suicidal and homicidal ideation, plan, and intent and no hallucinations, delusions, bizarre thinking or behavior reported or observed Orientation: time, person, place and purpose of appointment Cognition/Sensorium: memory, attention, language, and fund of knowledge intact  Insight: fair Judgment: fair  Structured Assessment Results: The Patient Health Questionnaire-9 (PHQ-9) is a self-report measure that assesses symptoms and severity of depression over the course of the last two weeks. [name] obtained a score of 4 suggesting minimal depression. [name] finds the endorsed symptoms to be not difficult at all. Little interest or pleasure in doing things 0  Feeling down, depressed, or hopeless 0  Trouble falling or staying asleep, or sleeping too much 0  Feeling tired or having little energy 1  Poor appetite or overeating 1  Feeling bad about yourself --- or that you are a failure or have let yourself or your family down 0  Trouble concentrating on things, such as reading the newspaper or watching television 0  Moving or speaking so slowly that other people could have noticed? Or the opposite --- being so fidgety or restless that you have been moving around a lot more than usual 2  Thoughts that you would be better off dead or hurting yourself in some way 0  PHQ-9 Score 4    The Generalized Anxiety Disorder-7 (GAD-7) is a brief self-report measure that assesses symptoms of anxiety over the course of the last two weeks. [name] obtained a score of 2 suggesting minimal anxiety. [name] finds the endorsed symptoms to be not difficult at all. Feeling nervous, anxious, on edge 0  Not being  able to stop or control worrying 0  Worrying too much about different things 0  Trouble relaxing 0  Being so restless that it's hard to sit still 0  Becoming easily annoyed or irritable 2  Feeling afraid as if something awful might happen 0  GAD-7 Score 2   Interventions:  Conducted a brief chart review Verbal administration of PHQ-9 and GAD-7 for symptom monitoring Provided empathic reflections and validation Conducted a risk assessment Processed thoughts and feelings Psychoeducation provided regarding physical versus emotional hunger Discussed option for a referral for longer-term therapeutic services Focused on rapport building Employed supportive psychotherapy interventions to facilitate reduced distress, and to improve coping skills with identified stressors  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 Progress is limited, as Clarity has just begun treatment with this provider; however, she is receptive to the interaction and interventions and rapport is being established.   Plan: [name] continues to appear able and willing to participate as evidenced by engagement in reciprocal conversation, and asking questions for clarification as appropriate. The next appointment will be scheduled in two weeks, which will be via [employer]. Once this provider's office resumes in-person appointments and it is deemed appropriate, Barbra will be notified. The next session will focus on reviewing hunger patterns and the introduction of triggers for emotional eating. This provider will continue to assess for safety.

## 2019-06-25 ENCOUNTER — Other Ambulatory Visit: Payer: Self-pay

## 2019-06-25 ENCOUNTER — Ambulatory Visit (INDEPENDENT_AMBULATORY_CARE_PROVIDER_SITE_OTHER): Payer: BC Managed Care – PPO | Admitting: Psychology

## 2019-06-25 DIAGNOSIS — F341 Dysthymic disorder: Secondary | ICD-10-CM | POA: Diagnosis not present

## 2019-06-27 ENCOUNTER — Ambulatory Visit (INDEPENDENT_AMBULATORY_CARE_PROVIDER_SITE_OTHER): Payer: BC Managed Care – PPO | Admitting: Family Medicine

## 2019-07-02 ENCOUNTER — Encounter: Payer: Self-pay | Admitting: Neurology

## 2019-07-05 ENCOUNTER — Encounter (INDEPENDENT_AMBULATORY_CARE_PROVIDER_SITE_OTHER): Payer: Self-pay | Admitting: Family Medicine

## 2019-07-05 ENCOUNTER — Other Ambulatory Visit: Payer: Self-pay

## 2019-07-05 ENCOUNTER — Ambulatory Visit (INDEPENDENT_AMBULATORY_CARE_PROVIDER_SITE_OTHER): Payer: BC Managed Care – PPO | Admitting: Family Medicine

## 2019-07-05 VITALS — BP 127/81 | HR 90 | Temp 97.8°F | Ht 64.0 in | Wt 288.0 lb

## 2019-07-05 DIAGNOSIS — R7303 Prediabetes: Secondary | ICD-10-CM

## 2019-07-05 DIAGNOSIS — Z6841 Body Mass Index (BMI) 40.0 and over, adult: Secondary | ICD-10-CM | POA: Diagnosis not present

## 2019-07-05 DIAGNOSIS — E559 Vitamin D deficiency, unspecified: Secondary | ICD-10-CM

## 2019-07-06 ENCOUNTER — Other Ambulatory Visit (INDEPENDENT_AMBULATORY_CARE_PROVIDER_SITE_OTHER): Payer: Self-pay | Admitting: Family Medicine

## 2019-07-08 ENCOUNTER — Ambulatory Visit (INDEPENDENT_AMBULATORY_CARE_PROVIDER_SITE_OTHER): Payer: BC Managed Care – PPO | Admitting: Neurology

## 2019-07-08 DIAGNOSIS — F902 Attention-deficit hyperactivity disorder, combined type: Secondary | ICD-10-CM

## 2019-07-08 DIAGNOSIS — F332 Major depressive disorder, recurrent severe without psychotic features: Secondary | ICD-10-CM

## 2019-07-08 DIAGNOSIS — G4710 Hypersomnia, unspecified: Secondary | ICD-10-CM | POA: Diagnosis not present

## 2019-07-08 DIAGNOSIS — Z6841 Body Mass Index (BMI) 40.0 and over, adult: Secondary | ICD-10-CM

## 2019-07-08 DIAGNOSIS — F341 Dysthymic disorder: Secondary | ICD-10-CM

## 2019-07-08 DIAGNOSIS — R0683 Snoring: Secondary | ICD-10-CM

## 2019-07-08 DIAGNOSIS — G4719 Other hypersomnia: Secondary | ICD-10-CM

## 2019-07-09 NOTE — Progress Notes (Signed)
Office: [phone]  /  Fax: [phone]    Date: July 10, 2019   Appointment Start Time: 2:00pm Duration: 26 minutes Provider: Glennie Isle, Psy.D. Type of Session: Individual Therapy  Location of Patient: Home Location of Provider: [facility] Type of Contact: Telepsychological Visit via [employer] WebEx   Session Content: Wendy Levy is a 23 y.o. female presenting via [place] for a follow-up appointment to address the previously established treatment goal of decreasing emotional eating. Today's appointment was a telepsychological visit, as this provider's clinic is seeing a limited number of patients for in-person visits due to COVID-19. Therapeutic services will resume to in-person appointments once deemed appropriate. Wendy Levy expressed understanding regarding the rationale for telepsychological services, and provided verbal consent for today's appointment. Prior to proceeding with today's appointment, Wendy Levy's physical location at the time of this appointment was obtained. Wendy Levy reported she was at home and provided the address. In the event of technical difficulties, Wendy Levy shared a phone number she could be reached at. Wendy Levy and this provider participated in today's telepsychological service. Also, Wendy Levy denied anyone else being present in the room or on the WebEx appointment.  This provider conducted a brief check-in and verbally administered the PHQ-9 and GAD-7. Wendy Levy reported, "I got a little puppy." She also shared she completed a sleep study. A risk assessment was completed. Wendy Levy denied experiencing suicidal and homicidal ideation, plan, and intent since the last appointment with this provider. She added, "Nope. None whatsoever." She reported she continues to have easy access to the developed safety plan, and continues to acknowledge understanding regarding the importance of reaching out to trusted individuals and/or emergency resources if she is unable  to ensure safety. This provider also requested again she complete a release of information for coordination of care with her psychiatrist. Wendy Levy noted, "I got it and I'll get on it."   Regarding eating, Wendy Levy shared, "The eating is going a whole lot better." She described engaging in a "pea test" to determine if she is experiencing emotional and physical hunger. This was positively reinforced. Wendy Levy denied engaging in emotional eating since the last appointment with this provider. Furthermore, psychoeducation regarding triggers for emotional eating was provided. Wendy Levy was provided a handout, and encouraged to utilize the handout between now and the next appointment to increase awareness of triggers and frequency. Wendy Levy agreed. This provider also discussed behavioral strategies for specific triggers, such as placing the utensil down when conversing to avoid mindless eating.  Chaeli provided verbal consent during today's appointment for this provider to send the handout about triggers via e-mail. Wendy Levy noted, "I'm always stressed." She was unsure what is contributing to current stress. Thus, activities she enjoys to engage in were explored to assist with stress management. Wendy Levy stated she enjoys walking, going for a drive, exploring different places, and watching a travel show. She was encouraged to engage in different activities when experiencing stress. Wendy Levy agreed. Overall, Wendy Levy was receptive to today's session as evidenced by openness to sharing, responsiveness to feedback, and willingness to explore triggers for emotional eating.  Mental Status Examination:  Appearance: neat Behavior: cooperative Mood: euthymic Affect: mood congruent Speech: normal in rate, volume, and tone Eye Contact: appropriate Psychomotor Activity: appropriate Thought Process: linear, logical, and goal directed  Content/Perceptual Disturbances: denies suicidal and homicidal ideation, plan, and intent and no  hallucinations, delusions, bizarre thinking or behavior reported or observed Orientation: time, person, place and purpose of appointment Cognition/Sensorium: memory, attention, language, and fund of knowledge intact  Insight: fair Judgment: fair  Structured Assessment Results: The Patient Health Questionnaire-9 (PHQ-9) is a self-report measure that assesses symptoms and severity of depression over the course of the last two weeks. Wendy Levy obtained a score of 8 suggesting mild depression. Wendy Levy finds the endorsed symptoms to be somewhat difficult. Little interest or pleasure in doing things 0  Feeling down, depressed, or hopeless 0  Trouble falling or staying asleep, or sleeping too much 2  Feeling tired or having little energy 3  Poor appetite or overeating 0  Feeling bad about yourself --- or that you are a failure or have let yourself or your family down 0  Trouble concentrating on things, such as reading the newspaper or watching television 2  Moving or speaking so slowly that other people could have noticed? Or the opposite --- being so fidgety or restless that you have been moving around a lot more than usual 1  Thoughts that you would be better off dead or hurting yourself in some way 0  PHQ-9 Score 8    The Generalized Anxiety Disorder-7 (GAD-7) is a brief self-report measure that assesses symptoms of anxiety over the course of the last two weeks. Wendy Levy obtained a score of 6 suggesting mild anxiety. Wendy Levy finds the endorsed symptoms to be somewhat difficult. Feeling nervous, anxious, on edge 0  Not being able to stop or control worrying 0  Worrying too much about different things 0  Trouble relaxing 2  Being so restless that it's hard to sit still 2  Becoming easily annoyed or irritable 2  Feeling afraid as if something awful might happen 0  GAD-7 Score 6   Interventions:  Conducted a brief chart review Verbal administration of PHQ-9 and GAD-7 for symptom  monitoring Provided empathic reflections and validation Reviewed content from the previous session Conducted a risk assessment Psychoeducation provided regarding triggers for emotional eating Provided positive reinforcement Employed supportive psychotherapy interventions to facilitate reduced distress, and to improve coping skills with identified stressors  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 Mariel has demonstrated some progress in her goal as evidenced by increased awareness of hunger patterns and exploration of triggers for emotional eating.   Plan: Elyza continues to appear able and willing to participate as evidenced by engagement in reciprocal conversation, and asking questions for clarification as appropriate. The next appointment will be scheduled in 1-2 weeks, which will be via [employer]. The next session will focus on reviewing triggers for emotional eating and the introduction of pleasurable activities.

## 2019-07-09 NOTE — Progress Notes (Signed)
Office: [phone]  /  Fax: [phone]   HPI:   Chief Complaint: OBESITY Wendy Levy is here to discuss her progress with her obesity treatment plan. She is on the Category 2 plan and is following her eating plan approximately 50 % of the time. She states she is walking 60 minutes 7 times per week. Wendy Levy's dog passed away in the past few weeks. She started using the scale to weigh out her food, and she realized how much meat it is. She is getting in approximately six cups of meat and she is trying to get her vegetables in. Wendy Levy doesn't follow the plan for lunch. She is using frozen fruit bars for her snack calories. Her weight is 288 lb (130.6 kg) today and has had a weight loss of 3 pounds over a period of 2 weeks since her last visit. She has gained 2 lbs since starting treatment with [place].  Vitamin D deficiency Wendy Levy has a diagnosis of vitamin D deficiency. She is currently taking vit D. Annis admits to fatigue and she denies nausea, vomiting or muscle weakness.  Pre-Diabetes Wendy Levy has a diagnosis of prediabetes based on her elevated Hgb A1c and was informed this puts her at greater risk of developing diabetes. Wendy Levy is still experiencing carb cravings and she is getting in approximately one can of soda per day. She continues to work on diet and exercise to decrease risk of diabetes.  ASSESSMENT AND PLAN:  Vitamin D deficiency  Prediabetes  Class 3 severe obesity with serious comorbidity and body mass index (BMI) of 45.0 to 49.9 in adult, unspecified obesity type ([place])  PLAN:  Vitamin D Deficiency Wendy Levy was informed that low vitamin D levels contributes to fatigue and are associated with obesity, breast, and colon cancer. She will continue to take prescription Vit D @50 ,000 IU every week (no refill needed) and will follow up for routine testing of vitamin D, at least 2-3 times per year. She was informed of the risk of over-replacement of vitamin D and agrees to not  increase her dose unless she discusses this with [place] first.  Pre-Diabetes Wendy Levy will continue to work on weight loss, exercise, and decreasing simple carbohydrates in her diet to help decrease the risk of diabetes. She was informed that eating too many simple carbohydrates or too many calories at one sitting increases the likelihood of GI side effects. We will repeat labs in two months and Wendy Levy agrees to follow up with [place] as directed to monitor her progress.  I spent > than 50% of the 15 minute visit on counseling as documented in the note.  Obesity Wendy Levy is currently in the action stage of change. As such, her goal is to continue with weight loss efforts She has agreed to follow the Category 2 plan Wendy Levy has been instructed to work up to a goal of 150 minutes of combined cardio and strengthening exercise per week for weight loss and overall health benefits. We discussed the following Behavioral Modification Strategies today: planning for success, keeping healthy foods in the home, increasing lean protein intake, increasing vegetables and work on meal planning and easy cooking plans  Wendy Levy has agreed to follow up with our clinic in 2 weeks. She was informed of the importance of frequent follow up visits to maximize her success with intensive lifestyle modifications for her multiple health conditions.  ALLERGIES: Allergies  Allergen Reactions  . Camphor Rash    Rash with topical product Rash with topical product    MEDICATIONS:  Current Outpatient Medications on File Prior to Visit  Medication Sig Dispense Refill  . albuterol (PROVENTIL HFA;VENTOLIN HFA) 108 (90 Base) MCG/ACT inhaler Inhale 1-2 puffs into the lungs every 4 (four) hours as needed for wheezing or shortness of breath. 1 Inhaler 0  . baclofen (LIORESAL) 10 MG tablet Take 10 mg by mouth 2 (two) times daily.    . cetirizine (ZYRTEC) 10 MG tablet Take 10 mg by mouth daily as needed for allergies.    Marland Kitchen ergocalciferol  (VITAMIN D2) 1.25 MG (50000 UT) capsule Take 50,000 Units by mouth once a week.    . hydrOXYzine (ATARAX/VISTARIL) 25 MG tablet Take 1 tablet (25 mg total) by mouth at bedtime as needed (sleep). 90 tablet 0  . lisdexamfetamine (VYVANSE) 60 MG capsule Take 1 capsule (60 mg total) by mouth every morning. 30 capsule 0  . medroxyPROGESTERone (DEPO-PROVERA) 400 MG/ML SUSP injection Inject into the muscle once.    . metFORMIN (GLUCOPHAGE) 500 MG tablet Take 1 tablet (500 mg total) by mouth daily with breakfast. 30 tablet 0  . PARoxetine (PAXIL) 40 MG tablet Take 1 tablet (40 mg total) by mouth daily. 90 tablet 0  . zonisamide (ZONEGRAN) 25 MG capsule TK 4 CS PO QD HS  1   No current facility-administered medications on file prior to visit.     PAST MEDICAL HISTORY: Past Medical History:  Diagnosis Date  . ADHD (attention deficit hyperactivity disorder)   . Allergy   . Anxiety   . Asthma   . Constipation   . Depression   . Dysmenorrhea    Cramps periodically.   Marland Kitchen Dysthymic disorder   . History of chlamydia 2016  . Migraine headache   . Oppositional defiant disorder   . Pre-diabetes   . Shortness of breath   . Shoulder dislocation    bilateral  . Swelling of lower extremity   . Vitamin D deficiency   . Wrist fracture, bilateral     PAST SURGICAL HISTORY: Past Surgical History:  Procedure Laterality Date  . WISDOM TOOTH EXTRACTION  04/2013    SOCIAL HISTORY: Social History   Tobacco Use  . Smoking status: Never Smoker  . Smokeless tobacco: Never Used  Substance Use Topics  . Alcohol use: Not Currently    Alcohol/week: 0.0 standard drinks    Comment: only on special occasions   . Drug use: No    FAMILY HISTORY: Family History  Adopted: Yes  Problem Relation Age of Onset  . Depression Sister        reportedly in and out of treatment facilities, also dx w/ RAD  . Suicidality Sister   . Obesity Mother     ROS: Review of Systems  Constitutional: Positive for  malaise/fatigue and weight loss.  Gastrointestinal: Negative for nausea and vomiting.  Musculoskeletal:       Negative for muscle weakness  Endo/Heme/Allergies:       Positive for carb cravings    PHYSICAL EXAM: Blood pressure 127/81, pulse 90, temperature 97.8 F (36.6 C), temperature source Oral, height 5\' 4"  (1.626 m), weight 288 lb (130.6 kg), SpO2 98 %. Body mass index is 49.44 kg/m. Physical Exam Vitals signs reviewed.  Constitutional:      Appearance: Normal appearance. She is well-developed. She is obese.  Cardiovascular:     Rate and Rhythm: Normal rate.  Pulmonary:     Effort: Pulmonary effort is normal.  Musculoskeletal: Normal range of motion.  Skin:    General: Skin is warm  and dry.  Neurological:     Mental Status: She is alert and oriented to person, place, and time.  Psychiatric:        Mood and Affect: Mood normal.        Behavior: Behavior normal.     RECENT LABS AND TESTS: BMET    Component Value Date/Time   NA 141 05/30/2019 1555   K 4.1 05/30/2019 1555   CL 104 05/30/2019 1555   CO2 20 05/30/2019 1555   GLUCOSE 65 05/30/2019 1555   GLUCOSE 79 04/28/2016 1643   BUN 9 05/30/2019 1555   CREATININE 0.74 05/30/2019 1555   CREATININE 0.72 04/28/2016 1643   CALCIUM 9.4 05/30/2019 1555   GFRNONAA 114 05/30/2019 1555   GFRNONAA >89 02/12/2014 1003   GFRAA 131 05/30/2019 1555   GFRAA >89 02/12/2014 1003   Lab Results  Component Value Date   HGBA1C 5.2 02/12/2014   Lab Results  Component Value Date   INSULIN 36.7 (H) 05/30/2019   CBC    Component Value Date/Time   WBC 12.3 (H) 11/02/2017 1745   WBC 13.4 (H) 04/28/2016 1643   RBC 5.01 11/02/2017 1745   RBC 5.11 (H) 04/28/2016 1643   HGB 14.3 11/02/2017 1745   HCT 42.3 11/02/2017 1745   PLT 441 (H) 11/02/2017 1745   MCV 84 11/02/2017 1745   MCH 28.5 11/02/2017 1745   MCH 27.4 04/28/2016 1643   MCHC 33.8 11/02/2017 1745   MCHC 32.9 04/28/2016 1643   RDW 14.7 11/02/2017 1745   LYMPHSABS  3.2 (H) 11/02/2017 1745   MONOABS 804 04/28/2016 1643   EOSABS 0.2 11/02/2017 1745   BASOSABS 0.0 11/02/2017 1745   Iron/TIBC/Ferritin/ %Sat No results found for: IRON, TIBC, FERRITIN, IRONPCTSAT Lipid Panel     Component Value Date/Time   CHOL 209 (H) 04/28/2016 1643   TRIG 315 (H) 04/28/2016 1643   HDL 51 04/28/2016 1643   CHOLHDL 4.1 04/28/2016 1643   VLDL 63 (H) 04/28/2016 1643   LDLCALC 95 04/28/2016 1643   Hepatic Function Panel     Component Value Date/Time   PROT 6.8 05/30/2019 1555   ALBUMIN 4.3 05/30/2019 1555   AST 20 05/30/2019 1555   ALT 25 05/30/2019 1555   ALKPHOS 88 05/30/2019 1555   BILITOT 0.4 05/30/2019 1555      Component Value Date/Time   TSH 2.070 11/02/2017 1745   TSH 1.44 04/28/2016 1643   TSH 2.304 02/05/2015 1258      OBESITY BEHAVIORAL INTERVENTION VISIT  Today's visit was # 3   Starting weight: 286 lbs Starting date: 05/30/2019 Today's weight : 288 lbs  Today's date: 07/05/2019 Total lbs lost to date: 0    07/05/2019  Height 5\' 4"  (1.626 m)  Weight 288 lb (130.6 kg)  BMI (Calculated) 49.41  BLOOD PRESSURE - SYSTOLIC 784  BLOOD PRESSURE - DIASTOLIC 81   Body Fat % 52 %  Total Body Water (lbs) 100 lbs    ASK: We discussed the diagnosis of obesity with Cardell Peach today and Afton agreed to give [place] permission to discuss obesity behavioral modification therapy today.  ASSESS: Cherith has the diagnosis of obesity and her BMI today is 49.41 Shaeleigh is in the action stage of change   ADVISE: Jonise was educated on the multiple health risks of obesity as well as the benefit of weight loss to improve her health. She was advised of the need for long term treatment and the importance of lifestyle modifications to improve her  current health and to decrease her risk of future health problems.  AGREE: Multiple dietary modification options and treatment options were discussed and  Tiyah agreed to follow the recommendations documented in  the above note.  ARRANGE: Mykhia was educated on the importance of frequent visits to treat obesity as outlined per CMS and USPSTF guidelines and agreed to schedule her next follow up appointment today.  I, Doreene Nest, am acting as transcriptionist for Eber Jones, MD  I have reviewed the above documentation for accuracy and completeness, and I agree with the above. - Ilene Qua, MD

## 2019-07-10 ENCOUNTER — Ambulatory Visit (INDEPENDENT_AMBULATORY_CARE_PROVIDER_SITE_OTHER): Payer: BC Managed Care – PPO | Admitting: Psychology

## 2019-07-10 ENCOUNTER — Other Ambulatory Visit: Payer: Self-pay

## 2019-07-10 DIAGNOSIS — F341 Dysthymic disorder: Secondary | ICD-10-CM | POA: Diagnosis not present

## 2019-07-11 NOTE — Progress Notes (Signed)
Office: [phone]  /  Fax: [phone]    Date: July 18, 2019   Appointment Start Time: 10:02am Duration: 29 minutes Provider: Glennie Isle, Psy.D. Type of Session: Individual Therapy  Location of Patient: Home Location of Provider: Provider's Home Type of Contact: Telepsychological Visit via [employer] WebEx   Session Content: July is a 23 y.o. female presenting via [place] for a follow-up appointment to address the previously established treatment goal of decreasing emotional eating. Today's appointment was a telepsychological visit, as this provider's clinic is seeing a limited number of patients for in-person visits due to COVID-19. Therapeutic services will resume to in-person appointments once deemed appropriate. Ashleynicole expressed understanding regarding the rationale for telepsychological services, and provided verbal consent for today's appointment. Prior to proceeding with today's appointment, Zahrah's physical location at the time of this appointment was obtained. Avarae reported she was at home and provided the address. In the event of technical difficulties, Shannah shared a phone number she could be reached at. Lindalou and this provider participated in today's telepsychological service. Also, Danicia denied anyone else being present in the room or on the WebEx appointment.  This provider conducted a brief check-in and verbally administered the PHQ-9 and GAD-7. Shantoria reported, "I'm doing good." She shared yesterday she went to [place] for her car and she is unsure if she will be able to drive it "any time soon." Thus, she expressed concern about her ability to attend her in office appointment with Dr. Adair Patter. It was recommended she call the office to determine if a virtual visit is an option; she agreed. Additionally, she shared she was diagnosed with moderate sleep apnea. 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   A risk  assessment was completed. Geni denied experiencing suicidal and homicidal ideation, plan, and intent since the last appointment with this provider. She reported she continues to have easy access to the developed safety plan, and continues to acknowledge understanding regarding the importance of reaching out to trusted individuals and/or emergency resources if she is unable to ensure safety. Regarding the authorization form to coordinate care with her psychiatrist, Afrah noted "I just keep forgetting to sign the paperwork."  his provider recommended that she printed and send it electronically back toto this provider after it is completed. Alternatively, she may print the documentation, complete it, and return it during her appointment in office with Dr. Adair Patter. Dafney was agreeable.  Regarding eating, Sahaana stated, "It's going fine."  However, she noted, "I don't really eat." She reported she increased her walking and denied episodes of emotional eating since last appointment with this provider. It was recommended she discuss her decreased appetite with Dr. Adair Patter; she agreed. Due to ongoing sleeping difficulties, psychoeducation regarding sleep hygiene was provided and its possible impact on eating. Sunni provided verbal consent during today's appointment for this provider to send the sleep hygiene handout via e-mail. This provider also discussed the importance of routine and potential consequences to sleep, appetite, and overall well-being. Based on Syretta's self-report of a reduction in emotional eating, this provider discussed termination planning, including the option again  for a referral for longer-term therapeutic services. Despite this provider's recommendation for longer-term therapeutic services, Devanie indicated, "I know I am not ready for other therapy." Based on her progress related to the previously established treatment goal, Dorismar was receptive for 2 additional follow-up  appointments prior to termination as evidenced by her stating "that sounds good." Overall, Taygan  was receptive to today's session as evidenced by openness to sharing, responsiveness to feedback, and willingness to implement sleep hygiene strategies.  Mental Status Examination:  Appearance: neat Behavior: cooperative Mood: euthymic Affect: mood congruent Speech: normal in rate, volume, and tone Eye Contact: appropriate Psychomotor Activity: appropriate Thought Process: linear, logical, and goal directed  Content/Perceptual Disturbances: denies suicidal and homicidal ideation, plan, and intent and no hallucinations, delusions, bizarre thinking or behavior reported or observed Orientation: time, person, place and purpose of appointment Cognition/Sensorium: memory, attention, language, and fund of knowledge intact  Insight: good Judgment: good  Structured Assessment Results: The Patient Health Questionnaire-9 (PHQ-9) is a self-report measure that assesses symptoms and severity of depression over the course of the last two weeks; however, the administration today was based on the past week. Oluwadarasimi obtained a score of 4 suggesting minimal depression. Avangelina finds the endorsed symptoms to be somewhat difficult. Little interest or pleasure in doing things 0  Feeling down, depressed, or hopeless 0  Trouble falling or staying asleep, or sleeping too much 1  Feeling tired or having little energy 1  Poor appetite or overeating 0  Feeling bad about yourself --- or that you are a failure or have let yourself or your family down 0  Trouble concentrating on things, such as reading the newspaper or watching television 0  Moving or speaking so slowly that other people could have noticed? Or the opposite --- being so fidgety or restless that you have been moving around a lot more than usual 2  Thoughts that you would be better off dead or hurting yourself in some way 0  PHQ-9 Score 4    The Generalized  Anxiety Disorder-7 (GAD-7) is a brief self-report measure that assesses symptoms of anxiety over the course of the last two weeks; ; however, the administration today was based on the past week.Lenna Sciara obtained a score of 5 suggesting mild anxiety. Breindy finds the endorsed symptoms to be somewhat difficult. Feeling nervous, anxious, on edge 0  Not being able to stop or control worrying 0  Worrying too much about different things 0  Trouble relaxing 2  Being so restless that it's hard to sit still 1  Becoming easily annoyed or irritable 2  Feeling afraid as if something awful might happen 0  GAD-7 Score 5   Interventions:  Conducted a brief chart review Verbal administration of PHQ-9 and GAD-7 for symptom monitoring Provided empathic reflections and validation Reviewed content from the previous session Psychoeducation provided regarding sleep hygiene Discussed termination planning Discussed option for a referral for longer-term therapeutic services Employed supportive psychotherapy interventions to facilitate reduced distress, and to improve coping skills with identified stressors  Completed a risk assessment  DSM-5 Diagnosis: 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 Nattalie has demonstrated progress in her goal as evidenced by increased awareness of hunger patterns and triggers for emotional eating. Since onset of treatment with this provider, Sophi reported a reduction in emotional eating and continues to demonstrate willingness to engage in learned skills.  Plan: Ivyana continues to appear able and willing to participate as evidenced by engagement in reciprocal conversation, and asking questions for clarification as appropriate. The next appointment will be scheduled in three weeks, which will be via [employer]. The  next session will focus on reviewing sleep hygiene and the introduction of pleasurable activities as pleasurable activities were  not introduced during today's appointment due to [facility] presenting concerns.

## 2019-07-16 ENCOUNTER — Encounter: Payer: Self-pay | Admitting: Neurology

## 2019-07-16 ENCOUNTER — Telehealth: Payer: Self-pay | Admitting: Neurology

## 2019-07-16 ENCOUNTER — Other Ambulatory Visit: Payer: Self-pay | Admitting: Neurology

## 2019-07-16 DIAGNOSIS — Z6841 Body Mass Index (BMI) 40.0 and over, adult: Secondary | ICD-10-CM

## 2019-07-16 DIAGNOSIS — R0683 Snoring: Secondary | ICD-10-CM

## 2019-07-16 DIAGNOSIS — F332 Major depressive disorder, recurrent severe without psychotic features: Secondary | ICD-10-CM

## 2019-07-16 DIAGNOSIS — G4719 Other hypersomnia: Secondary | ICD-10-CM

## 2019-07-16 DIAGNOSIS — F341 Dysthymic disorder: Secondary | ICD-10-CM

## 2019-07-16 NOTE — Procedures (Signed)
PATIENT'S NAME:  Wendy Levy, Wendy Levy DOB:      1996/06/25      MR#:    [id number]     DATE OF RECORDING: 07/08/2019 REFERRING M.D.:  Ilene Qua, MD Study Performed:   Baseline Polysomnogram HISTORY:  Wendy Levy is a 23 y.o. year old Caucasian female patient seen face to face upon a referral on 06/12/2019 (Dr. Adair Patter) for a sleep consultation.   Wendy Levy is a right -handed  Caucasian female with a medical history of ADHD (attention deficit hyperactivity disorder), Allergy, Anxiety, Asthma, Constipation, Depression, Dysmenorrhea, Dysthymic disorder, Migraine headache, Oppositional defiant disorder, Pre-diabetes, Shortness of breath, Shoulder dislocation, Swelling of lower extremity, Vitamin D deficiency, and Wrist fracture, bilateral. She has been treated for morbid obesity.   Sleep relevant medical history: Parents and boyfriend have noted her to stop breathing. She snores. She is treated for depression, anxiety and has cyclic sleep problems.  Weight gain- related to depression, and biological parents are obese. She has biological siblings, 4 sisters and 12 brothers. She is now living with her biological father who states she snores. She feels tired.  No other family member on CPAP with OSA, insomnia, sleep walking.  Caffeine intake in form of Soda ("a lot - 4 cans a day"), no regular exercise, she just started to walk. The patient goes to bed at 10 PM- and it" takes 60 minutes" for her "meds to kick in". And continues to sleep for 2 hours, wakes at 1 AM and is not sure why- has racing thoughts, but she is not worrying a lot.  Rare bathroom breaks. Bedroom is cool, quiet and dark.  The preferred sleep position is supine and lateral, with the support of 3 pillows. Dreams are reportedly rare now on meds.    8.30 AM is the usual rise time. The patient wakes up spontaneously 8.30, not with an alarm.  She reports not feeling refreshed or restored in AM, with symptoms such as dry mouth, morning  headaches, dull headaches, and residual fatigue:" I could sleep the whole day".  The patient endorsed the Epworth Sleepiness Scale at 17/24 points.   The patient's weight 293 pounds with a height of 64 (inches), resulting in a BMI of 50.1 kg/m2. The patient's neck circumference measured 20 inches.  CURRENT MEDICATIONS: Lioresal, Zyrtec, Vitamin D2, Atarax, Vyvanse, Depo-Provera, Paxil, Zonogram   PROCEDURE:  This is a multichannel digital polysomnogram utilizing the Somnostar 11.2 system.  Electrodes and sensors were applied and monitored per AASM Specifications.   EEG, EOG, Chin and Limb EMG, were sampled at 200 Hz.  ECG, Snore and Nasal Pressure, Thermal Airflow, Respiratory Effort, CPAP Flow and Pressure, Oximetry was sampled at 50 Hz. Digital video and audio were recorded.      BASELINE STUDY: Lights Out was at 20:54 and Lights On at 05:00.  Total recording time (TRT) was 486.5 minutes, with a total sleep time (TST) of 444.5 minutes.   The patient's sleep latency was 8 minutes.  REM latency was 289.5 minutes.  The sleep efficiency was 91.4 %.     SLEEP ARCHITECTURE: WASO (Wake after sleep onset) was 33.5 minutes.  There were 3.5 minutes in Stage N1, 224 minutes Stage N2, 181 minutes Stage N3 and 36 minutes in Stage REM.  The percentage of Stage N1 was .8%, Stage N2 was 50.4%, Stage N3 was 40.7% and Stage R (REM sleep) was 8.1%.   RESPIRATORY ANALYSIS:  There were a total of 166 respiratory events:  3 obstructive apneas, 1  central apneas and 0 mixed apneas with a total of 4 apneas and an apnea index (AI) of .5 /hour. There were 162 hypopneas with a hypopnea index of 21.9 /hour. The patient also had 16 respiratory event related arousals (RERAs).    The total APNEA/HYPOPNEA INDEX (AHI) was 22.4/hour and the total RESPIRATORY DISTURBANCE INDEX was 24.4 /hour.  16 events occurred in REM sleep and 293 events in NREM. The REM AHI was 26.7 /hour, versus a non-REM AHI of 22/h. The patient spent 314 minutes  of total sleep time in the supine position and 131 minutes in non-supine. The supine AHI was 18.4 versus a non-supine AHI of 32.2.  OXYGEN SATURATION & C02:  The Wake baseline 02 saturation was 95%, with the lowest being 76%. Time spent below 89% saturation equaled 3 minutes. The arousals were noted as: 175 were spontaneous, 9 were associated with PLMs, and 56 were associated with respiratory events.  The patient had a total of 13 Periodic Limb Movements.  The Periodic Limb Movement (PLM) index was 1.8 and the PLM Arousal index was 1.2/hour. Snoring was noted. EKG was in keeping with normal sinus rhythm (NSR). Post-study, the patient indicated that sleep was shorter but in quality the same as usual.   IMPRESSION:  1. Moderate Obstructive Sleep Apnea (OSA), at AHI 22.4/h, associated with loud snoring and additional RERAs. 2. Mild Periodic Limb Movement Disorder (PLMD). 3. Many spontaneous, non- physiological arousals. 4. Snoring was supine sleep position dependent. 5. Normal EKG, EEG.  RECOMMENDATIONS: Advise full-night, attended, CPAP titration study to optimize therapy.   If insurance will not cover a return for attended sleep study, will use auto CPAP between 6-16 cm water, 2 cm EPR and mask of patients choice.   I certify that I have reviewed the entire raw data recording prior to the issuance of this report in accordance with the Standards of Accreditation of the American Academy of Sleep Medicine (AASM)  Larey Seat, MD    07-16-2019 Diplomat, American Board of Psychiatry and Neurology  Diplomat, American Board of [place] Director, [place] [facility] [employer]

## 2019-07-16 NOTE — Telephone Encounter (Signed)
-----   Message from Larey Seat, MD sent at 07/16/2019  9:52 AM EDT ----- IMPRESSION:   1. Moderate Obstructive Sleep Apnea (OSA), at AHI 22.4/h,  associated with loud snoring and additional RERAs.  2. Mild Periodic Limb Movement Disorder (PLMD).  3. Many spontaneous, non- physiological arousals.  4. Snoring was supine sleep position dependent.  5. Normal EKG, EEG.   RECOMMENDATIONS: Advise full-night, attended, CPAP titration  study to optimize therapy.   If insurance will not cover a return for attended sleep study,  will use auto CPAP between 6-16 cm water, 2 cm EPR and mask of  patients choice.   I certify that I have reviewed the entire raw data recording  prior to the issuance of this report in accordance with the  Standards of Accreditation of the [place] [employer] of Sleep  Medicine (AASM)   Larey Seat, MD  07-16-2019

## 2019-07-16 NOTE — Telephone Encounter (Signed)
I called Wendy Levy. I advised Wendy Levy that Dr. Brett Fairy reviewed their sleep study results and found that moderate sleep apnea and recommends that Wendy Levy be treated with a cpap. Dr. Brett Fairy recommends that Wendy Levy return for a repeat sleep study in order to properly titrate the cpap and ensure a good mask fit. Wendy Levy is agreeable to returning for a titration study. I advised Wendy Levy that our sleep lab will file with Wendy Levy's insurance and call Wendy Levy to schedule the sleep study when we hear back from the Wendy Levy's insurance regarding coverage of this sleep study. Wendy Levy verbalized understanding of results. Wendy Levy had no questions at this time but was encouraged to call back if questions arise.

## 2019-07-16 NOTE — Addendum Note (Signed)
Addended by: Larey Seat on: 07/16/2019 09:52 AM   Modules accepted: Orders

## 2019-07-18 ENCOUNTER — Other Ambulatory Visit: Payer: Self-pay

## 2019-07-18 ENCOUNTER — Ambulatory Visit (INDEPENDENT_AMBULATORY_CARE_PROVIDER_SITE_OTHER): Payer: BC Managed Care – PPO | Admitting: Psychology

## 2019-07-18 DIAGNOSIS — F341 Dysthymic disorder: Secondary | ICD-10-CM | POA: Diagnosis not present

## 2019-07-21 ENCOUNTER — Encounter (INDEPENDENT_AMBULATORY_CARE_PROVIDER_SITE_OTHER): Payer: Self-pay | Admitting: Family Medicine

## 2019-07-25 ENCOUNTER — Encounter (INDEPENDENT_AMBULATORY_CARE_PROVIDER_SITE_OTHER): Payer: Self-pay | Admitting: Family Medicine

## 2019-07-25 ENCOUNTER — Encounter (INDEPENDENT_AMBULATORY_CARE_PROVIDER_SITE_OTHER): Payer: Self-pay

## 2019-07-25 ENCOUNTER — Other Ambulatory Visit: Payer: Self-pay

## 2019-07-25 ENCOUNTER — Telehealth (INDEPENDENT_AMBULATORY_CARE_PROVIDER_SITE_OTHER): Payer: BC Managed Care – PPO | Admitting: Family Medicine

## 2019-07-25 DIAGNOSIS — R7303 Prediabetes: Secondary | ICD-10-CM | POA: Diagnosis not present

## 2019-07-25 DIAGNOSIS — Z6841 Body Mass Index (BMI) 40.0 and over, adult: Secondary | ICD-10-CM | POA: Diagnosis not present

## 2019-07-25 DIAGNOSIS — G4733 Obstructive sleep apnea (adult) (pediatric): Secondary | ICD-10-CM

## 2019-07-26 NOTE — Progress Notes (Signed)
Office: [phone]  /  Fax: [phone] TeleHealth Visit:  Wendy Levy has verbally consented to this TeleHealth visit today. The patient is located at home, the provider is located at the [employer] [facility] office. The participants in this visit include the listed provider and patient. The visit was conducted today via face time.  HPI:   Chief Complaint: OBESITY Wendy Levy is here to discuss her progress with her obesity treatment plan. She is on the Category 2 plan and is following her eating plan approximately 80 % of the time. She states she is walking for 60 minutes 7 times per week. Wendy Levy has been eating mostly sandwiches for lunch and dinner. For breakfast, she has mayo and mustard sandwich, secondary to not having much in the house. She is often skipping lunches, but doing vegetables and mashed potatoes for dinner.  We were unable to weigh the patient today for this TeleHealth visit. She feels as if she has lost weight since her last visit. She has lost 0 lbs since starting treatment with [place].  Pre-Diabetes Wendy Levy has a diagnosis of pre-diabetes based on her elevated Hgb A1c and was informed this puts her at greater risk of developing diabetes. She is taking metformin currently and reports taking it daily. She denies GI side effects of metformin. She continues to work on diet and exercise to decrease risk of diabetes.   Obstructive Sleep Apnea Wendy Levy's sleep study was showing mode of obstructive sleep apnea.  ASSESSMENT AND PLAN:  Prediabetes  OSA (obstructive sleep apnea)  Class 3 severe obesity with serious comorbidity and body mass index (BMI) of 45.0 to 49.9 in adult, unspecified obesity type (Wendy Levy)  PLAN:  Pre-Diabetes Wendy Levy will continue to work on weight loss, exercise, and decreasing simple carbohydrates in her diet to help decrease the risk of diabetes. We dicussed metformin including benefits and risks. She was informed that eating too many simple  carbohydrates or too many calories at one sitting increases the likelihood of GI side effects. Wendy Levy agrees to continue taking metformin, and she agrees to follow up with our clinic in 2 weeks as directed to monitor her progress.  Obstructive Sleep Apnea  Wendy Levy is to follow up on CPAP and full night sleep study. Wendy Levy agrees to follow up with our clinic in 2 weeks.  Obesity Wendy Levy is currently in the action stage of change. As such, her goal is to continue with weight loss efforts She has agreed to keep a food journal with 1100-1300 calories and 80+ grams of protein daily Wendy Levy has been instructed to work up to a goal of 150 minutes of combined cardio and strengthening exercise per week for weight loss and overall health benefits. We discussed the following Behavioral Modification Strategies today: increasing lean protein intake, increasing vegetables and work on meal planning and easy cooking plans, keeping healthy foods in the home, and planning for success   Wendy Levy has agreed to follow up with our clinic in 2 weeks. She was informed of the importance of frequent follow up visits to maximize her success with intensive lifestyle modifications for her multiple health conditions.  ALLERGIES: Allergies  Allergen Reactions  . Camphor Rash    Rash with topical product Rash with topical product    MEDICATIONS: Current Outpatient Medications on File Prior to Visit  Medication Sig Dispense Refill  . albuterol (PROVENTIL HFA;VENTOLIN HFA) 108 (90 Base) MCG/ACT inhaler Inhale 1-2 puffs into the lungs every 4 (four) hours as needed for wheezing or shortness of  breath. 1 Inhaler 0  . baclofen (LIORESAL) 10 MG tablet Take 10 mg by mouth 2 (two) times daily.    . cetirizine (ZYRTEC) 10 MG tablet Take 10 mg by mouth daily as needed for allergies.    Marland Kitchen ergocalciferol (VITAMIN D2) 1.25 MG (50000 UT) capsule Take 50,000 Units by mouth once a week.    . hydrOXYzine (ATARAX/VISTARIL) 25 MG  tablet Take 1 tablet (25 mg total) by mouth at bedtime as needed (sleep). 90 tablet 0  . lisdexamfetamine (VYVANSE) 60 MG capsule Take 1 capsule (60 mg total) by mouth every morning. 30 capsule 0  . medroxyPROGESTERone (DEPO-PROVERA) 400 MG/ML SUSP injection Inject into the muscle once.    Marland Kitchen PARoxetine (PAXIL) 40 MG tablet Take 1 tablet (40 mg total) by mouth daily. 90 tablet 0  . zonisamide (ZONEGRAN) 25 MG capsule TK 4 CS PO QD HS  1  . metFORMIN (GLUCOPHAGE) 500 MG tablet Take 1 tablet (500 mg total) by mouth daily with breakfast. 30 tablet 0   No current facility-administered medications on file prior to visit.     PAST MEDICAL HISTORY: Past Medical History:  Diagnosis Date  . ADHD (attention deficit hyperactivity disorder)   . Allergy   . Anxiety   . Asthma   . Constipation   . Depression   . Dysmenorrhea    Cramps periodically.   Marland Kitchen Dysthymic disorder   . History of chlamydia 2016  . Migraine headache   . Oppositional defiant disorder   . Pre-diabetes   . Shortness of breath   . Shoulder dislocation    bilateral  . Swelling of lower extremity   . Vitamin D deficiency   . Wrist fracture, bilateral     PAST SURGICAL HISTORY: Past Surgical History:  Procedure Laterality Date  . WISDOM TOOTH EXTRACTION  04/2013    SOCIAL HISTORY: Social History   Tobacco Use  . Smoking status: Never Smoker  . Smokeless tobacco: Never Used  Substance Use Topics  . Alcohol use: Not Currently    Alcohol/week: 0.0 standard drinks    Comment: only on special occasions   . Drug use: No    FAMILY HISTORY: Family History  Adopted: Yes  Problem Relation Age of Onset  . Depression Sister        reportedly in and out of treatment facilities, also dx w/ RAD  . Suicidality Sister   . Obesity Mother     ROS: Review of Systems  Constitutional: Positive for weight loss.    PHYSICAL EXAM: Pt in no acute distress  RECENT LABS AND TESTS: BMET    Component Value Date/Time   NA  141 05/30/2019 1555   K 4.1 05/30/2019 1555   CL 104 05/30/2019 1555   CO2 20 05/30/2019 1555   GLUCOSE 65 05/30/2019 1555   GLUCOSE 79 04/28/2016 1643   BUN 9 05/30/2019 1555   CREATININE 0.74 05/30/2019 1555   CREATININE 0.72 04/28/2016 1643   CALCIUM 9.4 05/30/2019 1555   GFRNONAA 114 05/30/2019 1555   GFRNONAA >89 02/12/2014 1003   GFRAA 131 05/30/2019 1555   GFRAA >89 02/12/2014 1003   Lab Results  Component Value Date   HGBA1C 5.2 02/12/2014   Lab Results  Component Value Date   INSULIN 36.7 (H) 05/30/2019   CBC    Component Value Date/Time   WBC 12.3 (H) 11/02/2017 1745   WBC 13.4 (H) 04/28/2016 1643   RBC 5.01 11/02/2017 1745   RBC 5.11 (H) 04/28/2016 1643  HGB 14.3 11/02/2017 1745   HCT 42.3 11/02/2017 1745   PLT 441 (H) 11/02/2017 1745   MCV 84 11/02/2017 1745   MCH 28.5 11/02/2017 1745   MCH 27.4 04/28/2016 1643   MCHC 33.8 11/02/2017 1745   MCHC 32.9 04/28/2016 1643   RDW 14.7 11/02/2017 1745   LYMPHSABS 3.2 (H) 11/02/2017 1745   MONOABS 804 04/28/2016 1643   EOSABS 0.2 11/02/2017 1745   BASOSABS 0.0 11/02/2017 1745   Iron/TIBC/Ferritin/ %Sat No results found for: IRON, TIBC, FERRITIN, IRONPCTSAT Lipid Panel     Component Value Date/Time   CHOL 209 (H) 04/28/2016 1643   TRIG 315 (H) 04/28/2016 1643   HDL 51 04/28/2016 1643   CHOLHDL 4.1 04/28/2016 1643   VLDL 63 (H) 04/28/2016 1643   LDLCALC 95 04/28/2016 1643   Hepatic Function Panel     Component Value Date/Time   PROT 6.8 05/30/2019 1555   ALBUMIN 4.3 05/30/2019 1555   AST 20 05/30/2019 1555   ALT 25 05/30/2019 1555   ALKPHOS 88 05/30/2019 1555   BILITOT 0.4 05/30/2019 1555      Component Value Date/Time   TSH 2.070 11/02/2017 1745   TSH 1.44 04/28/2016 1643   TSH 2.304 02/05/2015 1258      I, Trixie Dredge, am acting as transcriptionist for Wendy Qua, MD  I have reviewed the above documentation for accuracy and completeness, and I agree with the above. - Wendy Qua, MD

## 2019-08-01 NOTE — Progress Notes (Unsigned)
Office: [phone]  /  Fax: [phone]    Date: August 09, 2019   Appointment Start Time:*** Duration:*** Provider: Glennie Isle, Psy.D. Type of Session: Individual Therapy  Location of Patient: *** Location of Provider: {Location of Service:22491} Type of Contact: Telepsychological Visit via [employer] WebEx   Session Content: Wendy Levy is a 23 y.o. female presenting via [place] for a follow-up appointment to address the previously established treatment goal of decreasing emotional eating. Of note, this provider called Wendy Levy at 10:02am as she did not present for the [facility] appointment. *** The e-mail with the secure link was re-sent. As such, today's appointment was initiated *** minutes late.  Today's appointment was a telepsychological visit, as this provider's clinic is seeing a limited number of patients for in-person visits due to COVID-19. Therapeutic services will resume to in-person appointments once deemed appropriate. Wendy Levy expressed understanding regarding the rationale for telepsychological services, and provided verbal consent for today's appointment. Prior to proceeding with today's appointment, Wendy Levy's physical location at the time of this appointment was obtained. Wendy Levy reported she was at *** and provided the address. In the event of technical difficulties, Wendy Levy shared a phone number she could be reached at. M[place]in and this provider participated in today's telepsychological service. Also, Wendy Levy denied anyone else being present in the room or on the WebEx appointment ***.  This provider conducted a brief check-in and verbally administered the PHQ-9 and GAD-7. A risk assessment was completed. Wendy Levy denied experiencing suicidal and homicidal ideation, plan, and intent since the last appointment with this provider. She reported she continues to have easy access to the developed safety plan, and continues to acknowledge understanding regarding the importance of  reaching out to trusted individuals and/or emergency resources if she is unable to ensure safety. *** Wendy Levy was receptive to today's session as evidenced by openness to sharing, responsiveness to feedback, and ***.  Mental Status Examination:  Appearance: {Appearance:22431} Behavior: {Behavior:22445} Mood: {Teletherapy mood:22435} Affect: {Affect:22436} Speech: {Speech:22432} Eye Contact: {Eye Contact:22433} Psychomotor Activity: {Motor Activity:22434} Thought Process: {thought process:22448}  Content/Perceptual Disturbances: {disturbances:22451} Orientation: {Orientation:22437} Cognition/Sensorium: {gbcognition:22449} Insight: {Insight:22446} Judgment: {Insight:22446}  Structured Assessment Results: The Patient Health Questionnaire-9 (PHQ-9) is a self-report measure that assesses symptoms and severity of depression over the course of the last two weeks. Wendy Levy obtained a score of *** suggesting {GBPHQ9SEVERITY:21752}. Wendy Levy finds the endorsed symptoms to be {gbphq9difficulty:21754}. Little interest or pleasure in doing things ***  Feeling down, depressed, or hopeless ***  Trouble falling or staying asleep, or sleeping too much ***  Feeling tired or having little energy ***  Poor appetite or overeating ***  Feeling bad about yourself --- or that you are a failure or have let yourself or your family down ***  Trouble concentrating on things, such as reading the newspaper or watching television ***  Moving or speaking so slowly that other people could have noticed? Or the opposite --- being so fidgety or restless that you have been moving around a lot more than usual ***  Thoughts that you would be better off dead or hurting yourself in some way ***  PHQ-9 Score ***    The Generalized Anxiety Disorder-7 (GAD-7) is a brief self-report measure that assesses symptoms of anxiety over the course of the last two weeks. Wendy Levy obtained a score of *** suggesting {gbgad7severity:21753}.  Wendy Levy finds the endorsed symptoms to be {gbphq9difficulty:21754}. 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 Wendy Levy has demonstrated progress in her goal as evidenced by ***  Plan: Wendy Levy continues to appear able and willing to participate as evidenced by engagement in reciprocal conversation, and asking questions for clarification as appropriate. The next appointment will be scheduled in {gbweeks:21758}, which will be via [employer]. The next session will focus on reviewing learned skills, and working towards the established treatment goal.***

## 2019-08-08 ENCOUNTER — Ambulatory Visit (INDEPENDENT_AMBULATORY_CARE_PROVIDER_SITE_OTHER): Payer: BC Managed Care – PPO | Admitting: Family Medicine

## 2019-08-08 ENCOUNTER — Encounter (INDEPENDENT_AMBULATORY_CARE_PROVIDER_SITE_OTHER): Payer: Self-pay | Admitting: Family Medicine

## 2019-08-08 ENCOUNTER — Other Ambulatory Visit: Payer: Self-pay

## 2019-08-08 DIAGNOSIS — Z6841 Body Mass Index (BMI) 40.0 and over, adult: Secondary | ICD-10-CM | POA: Diagnosis not present

## 2019-08-08 DIAGNOSIS — R7303 Prediabetes: Secondary | ICD-10-CM | POA: Diagnosis not present

## 2019-08-08 DIAGNOSIS — G4733 Obstructive sleep apnea (adult) (pediatric): Secondary | ICD-10-CM | POA: Diagnosis not present

## 2019-08-08 MED ORDER — METFORMIN HCL 500 MG PO TABS: 500.0000 mg | ORAL_TABLET | Freq: Every day | ORAL | 0 refills | Status: DC

## 2019-08-09 ENCOUNTER — Telehealth (INDEPENDENT_AMBULATORY_CARE_PROVIDER_SITE_OTHER): Payer: BC Managed Care – PPO | Admitting: Family Medicine

## 2019-08-09 ENCOUNTER — Telehealth (INDEPENDENT_AMBULATORY_CARE_PROVIDER_SITE_OTHER): Payer: Self-pay | Admitting: Psychology

## 2019-08-09 ENCOUNTER — Ambulatory Visit (INDEPENDENT_AMBULATORY_CARE_PROVIDER_SITE_OTHER): Payer: BC Managed Care – PPO | Admitting: Psychology

## 2019-08-09 NOTE — Progress Notes (Signed)
Office: [phone]  /  Fax: [phone] TeleHealth Visit:  Wendy Levy has verbally consented to this TeleHealth visit today. The patient is located at home, the provider is located at the [employer] [facility] office. The participants in this visit include the listed provider and patient and any and all parties involved. The visit was conducted today via FaceTime.  HPI:   Chief Complaint: OBESITY Wendy Levy is here to discuss her progress with her obesity treatment plan. She is on the Category 2 plan and is following her eating plan approximately 50 % of the time. She states she is walking 60 to 90 minutes 7 times per week. Wendy Levy's last weight in the office was  288 pounds (patient reports weight of 299 lbs reported today). She has food in the house again and she is using the scale to weigh food approximately 20% of the time. She is following the plan mostly at dinner, 50% of the time at lunch and she is minimally doing breakfast. 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 We were unable to weigh the patient today for this TeleHealth visit. She feels as if she has gained weight since her last visit. She has lost 0 lbs since starting treatment with [place].  Pre-Diabetes Wendy Levy has a diagnosis of prediabetes based on her elevated Hgb A1c and was informed this puts her at greater risk of developing diabetes. She denies any GI side effects of metformin. Wendy Levy continues to work on diet and exercise to decrease risk of diabetes. She admits to carb cravings for crackers.  OSA (obstructive sleep apnea) Patient reports that she needs to call the neurology office to get the second part of a sleep study scheduled.  ASSESSMENT AND PLAN:  Prediabetes - Plan: metFORMIN (GLUCOPHAGE) 500 MG tablet  OSA (obstructive sleep apnea)  Class 3 severe obesity with serious comorbidity and body mass index (BMI) of 45.0 to 49.9 in adult, unspecified obesity type (Wendy Levy)  PLAN:  Pre-Diabetes Wendy Levy will  continue to work on weight loss, exercise, and decreasing simple carbohydrates in her diet to help decrease the risk of diabetes. We dicussed metformin including benefits and risks. She was informed that eating too many simple carbohydrates or too many calories at one sitting increases the likelihood of GI side effects. Wendy Levy agrees to continue metformin 500 mg once daily in the morning #30 with no refills and follow up with [place] as directed to monitor her progress.  OSA (obstructive sleep apnea) We will remind patient at the next appointment. She agrees to follow up with our clinic in 2 weeks.  Obesity Wendy Levy is currently in the action stage of change. As such, her goal is to continue with weight loss efforts She has agreed to keep a food journal with 250 to 350 calories and 20+ grams of protein daily at breakfast and follow the Category 2 plan Wendy Levy has been instructed to work up to a goal of 150 minutes of combined cardio and strengthening exercise per week for weight loss and overall health benefits. We discussed the following Behavioral Modification Strategies today: planning for success, keeping healthy foods in the home, increasing lean protein intake, increasing vegetables and work on meal planning and easy cooking plans  Wendy Levy has agreed to follow up with our clinic in 2 weeks. She was informed of the importance of frequent follow up visits to maximize her success with intensive lifestyle modifications for her multiple health conditions.  ALLERGIES: Allergies  Allergen Reactions  . Camphor Rash  Rash with topical product Rash with topical product    MEDICATIONS: Current Outpatient Medications on File Prior to Visit  Medication Sig Dispense Refill  . albuterol (PROVENTIL HFA;VENTOLIN HFA) 108 (90 Base) MCG/ACT inhaler Inhale 1-2 puffs into the lungs every 4 (four) hours as needed for wheezing or shortness of breath. 1 Inhaler 0  . baclofen (LIORESAL) 10 MG tablet Take 10 mg  by mouth 2 (two) times daily.    . cetirizine (ZYRTEC) 10 MG tablet Take 10 mg by mouth daily as needed for allergies.    Marland Kitchen ergocalciferol (VITAMIN D2) 1.25 MG (50000 UT) capsule Take 50,000 Units by mouth once a week.    . hydrOXYzine (ATARAX/VISTARIL) 25 MG tablet Take 1 tablet (25 mg total) by mouth at bedtime as needed (sleep). 90 tablet 0  . lisdexamfetamine (VYVANSE) 60 MG capsule Take 1 capsule (60 mg total) by mouth every morning. 30 capsule 0  . medroxyPROGESTERone (DEPO-PROVERA) 400 MG/ML SUSP injection Inject into the muscle once.    Marland Kitchen PARoxetine (PAXIL) 40 MG tablet Take 1 tablet (40 mg total) by mouth daily. 90 tablet 0  . zonisamide (ZONEGRAN) 25 MG capsule TK 4 CS PO QD HS  1   No current facility-administered medications on file prior to visit.     PAST MEDICAL HISTORY: Past Medical History:  Diagnosis Date  . ADHD (attention deficit hyperactivity disorder)   . Allergy   . Anxiety   . Asthma   . Constipation   . Depression   . Dysmenorrhea    Cramps periodically.   Marland Kitchen Dysthymic disorder   . History of chlamydia 2016  . Migraine headache   . Oppositional defiant disorder   . Pre-diabetes   . Shortness of breath   . Shoulder dislocation    bilateral  . Swelling of lower extremity   . Vitamin D deficiency   . Wrist fracture, bilateral     PAST SURGICAL HISTORY: Past Surgical History:  Procedure Laterality Date  . WISDOM TOOTH EXTRACTION  04/2013    SOCIAL HISTORY: Social History   Tobacco Use  . Smoking status: Never Smoker  . Smokeless tobacco: Never Used  Substance Use Topics  . Alcohol use: Not Currently    Alcohol/week: 0.0 standard drinks    Comment: only on special occasions   . Drug use: No    FAMILY HISTORY: Family History  Adopted: Yes  Problem Relation Age of Onset  . Depression Sister        reportedly in and out of treatment facilities, also dx w/ RAD  . Suicidality Sister   . Obesity Mother     ROS: Review of Systems   Constitutional: Negative for weight loss.  Gastrointestinal: Negative for diarrhea, nausea and vomiting.  Endo/Heme/Allergies:       Positive for carb cravings    PHYSICAL EXAM: Pt in no acute distress  RECENT LABS AND TESTS: BMET    Component Value Date/Time   NA 141 05/30/2019 1555   K 4.1 05/30/2019 1555   CL 104 05/30/2019 1555   CO2 20 05/30/2019 1555   GLUCOSE 65 05/30/2019 1555   GLUCOSE 79 04/28/2016 1643   BUN 9 05/30/2019 1555   CREATININE 0.74 05/30/2019 1555   CREATININE 0.72 04/28/2016 1643   CALCIUM 9.4 05/30/2019 1555   GFRNONAA 114 05/30/2019 1555   GFRNONAA >89 02/12/2014 1003   GFRAA 131 05/30/2019 1555   GFRAA >89 02/12/2014 1003   Lab Results  Component Value Date   HGBA1C  5.2 02/12/2014   Lab Results  Component Value Date   INSULIN 36.7 (H) 05/30/2019   CBC    Component Value Date/Time   WBC 12.3 (H) 11/02/2017 1745   WBC 13.4 (H) 04/28/2016 1643   RBC 5.01 11/02/2017 1745   RBC 5.11 (H) 04/28/2016 1643   HGB 14.3 11/02/2017 1745   HCT 42.3 11/02/2017 1745   PLT 441 (H) 11/02/2017 1745   MCV 84 11/02/2017 1745   MCH 28.5 11/02/2017 1745   MCH 27.4 04/28/2016 1643   MCHC 33.8 11/02/2017 1745   MCHC 32.9 04/28/2016 1643   RDW 14.7 11/02/2017 1745   LYMPHSABS 3.2 (H) 11/02/2017 1745   MONOABS 804 04/28/2016 1643   EOSABS 0.2 11/02/2017 1745   BASOSABS 0.0 11/02/2017 1745   Iron/TIBC/Ferritin/ %Sat No results found for: IRON, TIBC, FERRITIN, IRONPCTSAT Lipid Panel     Component Value Date/Time   CHOL 209 (H) 04/28/2016 1643   TRIG 315 (H) 04/28/2016 1643   HDL 51 04/28/2016 1643   CHOLHDL 4.1 04/28/2016 1643   VLDL 63 (H) 04/28/2016 1643   LDLCALC 95 04/28/2016 1643   Hepatic Function Panel     Component Value Date/Time   PROT 6.8 05/30/2019 1555   ALBUMIN 4.3 05/30/2019 1555   AST 20 05/30/2019 1555   ALT 25 05/30/2019 1555   ALKPHOS 88 05/30/2019 1555   BILITOT 0.4 05/30/2019 1555      Component Value Date/Time   TSH  2.070 11/02/2017 1745   TSH 1.44 04/28/2016 1643   TSH 2.304 02/05/2015 1258      I, Doreene Nest, am acting as Location manager for Eber Jones, MD  I have reviewed the above documentation for accuracy and completeness, and I agree with the above. - Ilene Qua, MD

## 2019-08-09 NOTE — Telephone Encounter (Signed)
  Office: [phone]  /  Fax: [phone]  Date of Call: August 09, 2019 Time of Call: 10:02am Duration of Call: ~3 minutes Provider: Glennie Isle, PsyD  CONTENT: This provider called Shaelee to check-in as she did not present for today's Webex appointment at 10:00am. Kadience noted, "I've been meaning to call you." She noted she obtained employment with [employer] and today was her first day; therefore, she expressed desire to reschedule today's appointment. This provider congratulated Tristina on the employment and the appointment was rescheduled. In addition, she shared she completed the authorizations for coordination of care with her psychiatrist, but was unsure how to return the completed forms to this provider. This provider explained she may return the documents as attachments to a MyChart message. This provider explained the message would be visible to all providers, as it would be part of the electronic medical record. Shani verbally acknowledged understanding, and stated, "That's okay." A risk assessment was completed. Rashauna denied experiencing suicidal and homicidal ideation, plan, and intent since the last appointment with this provider. She reported she continues to have easy access to the developed safety plan, and continues to acknowledge understanding regarding the importance of reaching out to trusted individuals and/or emergency resources if she is unable to ensure safety.  PLAN:  Dawnyelle is scheduled for an appointment on August 16, 2019 at 11:30am via [employer].

## 2019-08-09 NOTE — Progress Notes (Unsigned)
Office: [phone]  /  Fax: [phone]    Date: August 16, 2019   Appointment Start Time:*** Duration:*** Provider: Glennie Isle, Psy.D. Type of Session: Individual Therapy  Location of Patient: *** Location of Provider: {Location of Service:22491} Type of Contact: Telepsychological Visit via [employer] Web[employer] ***  Session Content: Wendy Levy is a 23 y.o. female presenting via [employer]*** for a follow-up appointment to address the previously established treatment goal of {Follow up tx goals:22299}. Today's appointment was a telepsychological visit, as this provider's clinic is seeing a limited number of patients for in-person visits due to COVID-19. Therapeutic services will resume to in-person appointments once deemed appropriate. Wendy Levy expressed understanding regarding the rationale for telepsychological services, and provided verbal consent for today's appointment. Prior to proceeding with today's appointment, Wendy Levy's physical location at the time of this appointment was obtained. Wendy Levy reported she was at *** and provided the address. In the event of technical difficulties, Wendy Levy shared a phone number she could be reached at. Wendy Levy and this provider participated in today's telepsychological service. Also, Wendy Levy denied anyone else being present in the room or on the WebEx appointment ***.  This provider conducted a brief check-in and verbally administered the PHQ-9 and GAD-7. A risk assessment was completed. Wendy Levy denied experiencing suicidal and homicidal ideation, plan, and intent since the last appointment with this provider. She reported she continues to have easy access to the developed safety plan, and continues to acknowledge understanding regarding the importance of reaching out to trusted individuals and/or emergency resources if she is unable to ensure safety.  *** Wendy Levy was receptive to today's session as evidenced by openness to sharing, responsiveness to feedback,  and ***.  Mental Status Examination:  Appearance: {Appearance:22431} Behavior: {Behavior:22445} Mood: {Teletherapy mood:22435} Affect: {Affect:22436} Speech: {Speech:22432} Eye Contact: {Eye Contact:22433} Psychomotor Activity: {Motor Activity:22434} Thought Process: {thought process:22448}  Content/Perceptual Disturbances: {disturbances:22451} Orientation: {Orientation:22437} Cognition/Sensorium: {gbcognition:22449} Insight: {Insight:22446} Judgment: {Insight:22446}  Structured Assessment Results: The Patient Health Questionnaire-9 (PHQ-9) is a self-report measure that assesses symptoms and severity of depression over the course of the last two weeks. Wendy Levy obtained a score of *** suggesting {GBPHQ9SEVERITY:21752}. Wendy Levy finds the endorsed symptoms to be {gbphq9difficulty:21754}. Little interest or pleasure in doing things ***  Feeling down, depressed, or hopeless ***  Trouble falling or staying asleep, or sleeping too much ***  Feeling tired or having little energy ***  Poor appetite or overeating ***  Feeling bad about yourself --- or that you are a failure or have let yourself or your family down ***  Trouble concentrating on things, such as reading the newspaper or watching television ***  Moving or speaking so slowly that other people could have noticed? Or the opposite --- being so fidgety or restless that you have been moving around a lot more than usual ***  Thoughts that you would be better off dead or hurting yourself in some way ***  PHQ-9 Score ***    The Generalized Anxiety Disorder-7 (GAD-7) is a brief self-report measure that assesses symptoms of anxiety over the course of the last two weeks. Wendy Levy obtained a score of *** suggesting {gbgad7severity:21753}. Wendy Levy finds the endorsed symptoms to be {gbphq9difficulty:21754}. 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 Wendy Levy has demonstrated progress in her goal as evidenced by {gbtxprogress:22839}. Wendy Levy also reported {gbtxprogress2:22951}.  Plan: Wendy Levy continues to appear able and willing to participate as evidenced by engagement in reciprocal conversation, and asking questions for clarification as appropriate. The next appointment will be scheduled in {gbweeks:21758}, which will be via [employer]. The next session will focus on reviewing learned skills, and working towards the established treatment goal.***

## 2019-08-14 ENCOUNTER — Telehealth: Payer: Self-pay

## 2019-08-14 NOTE — Telephone Encounter (Signed)
We have attempted to call the patient two times to schedule sleep study.  Patient has been unavailable at the phone numbers we have on file and has not returned our calls. If patient calls back we will schedule them for their sleep study.  

## 2019-08-16 ENCOUNTER — Ambulatory Visit (INDEPENDENT_AMBULATORY_CARE_PROVIDER_SITE_OTHER): Payer: BC Managed Care – PPO | Admitting: Psychology

## 2019-08-16 ENCOUNTER — Telehealth (INDEPENDENT_AMBULATORY_CARE_PROVIDER_SITE_OTHER): Payer: Self-pay | Admitting: Psychology

## 2019-08-16 NOTE — Telephone Encounter (Signed)
  Office: [phone]  /  Fax: [phone]  Date of Call: August 16, 2019  Time of Call: 11:34am Provider: Glennie Isle, PsyD  CONTENT: This provider called Keileigh to check-in as she did not present for today's Webex appointment at 11:30am. A HIPAA compliant voicemail was left requesting a call back. Of note, this provider stayed on the [facility] appointment for 7 minutes prior to signing off, which is 2 additional minutes past the clinic's 5 minute grace period policy.    PLAN: This provider will wait for Toshie to call back. If deemed necessary, this provider or the provider's clinic will call [place] again in approximately one week.

## 2019-09-04 ENCOUNTER — Other Ambulatory Visit (HOSPITAL_COMMUNITY): Payer: Self-pay | Admitting: Psychiatry

## 2019-09-04 DIAGNOSIS — F4310 Post-traumatic stress disorder, unspecified: Secondary | ICD-10-CM

## 2019-09-04 DIAGNOSIS — F341 Dysthymic disorder: Secondary | ICD-10-CM

## 2019-09-05 ENCOUNTER — Other Ambulatory Visit (HOSPITAL_COMMUNITY): Payer: Self-pay | Admitting: Psychiatry

## 2019-09-05 DIAGNOSIS — F99 Mental disorder, not otherwise specified: Secondary | ICD-10-CM

## 2019-09-05 DIAGNOSIS — F5105 Insomnia due to other mental disorder: Secondary | ICD-10-CM

## 2019-09-06 ENCOUNTER — Encounter (HOSPITAL_COMMUNITY): Payer: Self-pay | Admitting: Psychiatry

## 2019-09-06 ENCOUNTER — Ambulatory Visit (INDEPENDENT_AMBULATORY_CARE_PROVIDER_SITE_OTHER): Payer: BC Managed Care – PPO | Admitting: Psychiatry

## 2019-09-06 ENCOUNTER — Other Ambulatory Visit: Payer: Self-pay

## 2019-09-06 DIAGNOSIS — F4310 Post-traumatic stress disorder, unspecified: Secondary | ICD-10-CM | POA: Diagnosis not present

## 2019-09-06 DIAGNOSIS — F900 Attention-deficit hyperactivity disorder, predominantly inattentive type: Secondary | ICD-10-CM | POA: Diagnosis not present

## 2019-09-06 DIAGNOSIS — F99 Mental disorder, not otherwise specified: Secondary | ICD-10-CM

## 2019-09-06 DIAGNOSIS — F5105 Insomnia due to other mental disorder: Secondary | ICD-10-CM

## 2019-09-06 DIAGNOSIS — F341 Dysthymic disorder: Secondary | ICD-10-CM | POA: Diagnosis not present

## 2019-09-06 MED ORDER — LISDEXAMFETAMINE DIMESYLATE 60 MG PO CAPS: 60.0000 mg | ORAL_CAPSULE | ORAL | 0 refills | Status: DC

## 2019-09-06 MED ORDER — HYDROXYZINE HCL 25 MG PO TABS: 25.0000 mg | ORAL_TABLET | Freq: Every evening | ORAL | 0 refills | Status: DC | PRN

## 2019-09-06 MED ORDER — PAROXETINE HCL 40 MG PO TABS: 40.0000 mg | ORAL_TABLET | Freq: Every day | ORAL | 0 refills | Status: DC
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
## 2019-09-10 NOTE — Progress Notes (Unsigned)
Office: [phone]  /  Fax: [phone]    Date:  September 11, 2019   Appointment Start Time: *** Duration: *** minutes Provider: Glennie Isle, Psy.D. Type of Session: Individual Therapy  Location of Patient: *** Location of Provider: Provider's Home Type of Contact: Telepsychological Visit via [employer] WebEx   Session Content:Of note, this provider called Minola at 8:32am as she did not present for the [facility] appointment. *** The e-mail with the secure link was re-sent. As such, today's appointment was initiated *** minutes late.  Wendy Levy is a 23 y.o. female presenting via [place] for a follow-up appointment to address the previously established treatment goal of decreasing emotional eating. Today's appointment was a telepsychological visit, as it is an option for appointments to reduce exposure to COVID-19. [name] expressed understanding regarding the rationale for telepsychological services, and provided verbal consent for today's appointment. Prior to proceeding with today's appointment, [name]'s physical location at the time of this appointment was obtained. [name] reported she was at *** and provided the address. In the event of technical difficulties, [name] shared a phone number she could be reached at. [name] and this provider participated in today's telepsychological service. Also, [name] denied anyone else being present in the room or on the WebEx appointment ***.  This provider conducted a brief check-in and verbally administered the PHQ-9 and GAD-7. A risk assessment was completed. [name] denied experiencing suicidal and homicidal ideation, plan, and intent since the last appointment with this provider. She reported she continues to have easy access to the developed safety plan, and continues to acknowledge understanding regarding the importance of reaching out to trusted individuals and/or emergency resources if she is unable to ensure safety. *** An was receptive to  today's session as evidenced by openness to sharing, responsiveness to feedback, and ***.  Mental Status Examination:  Appearance: {Appearance:22431} Behavior: {Behavior:22445} Mood: {gbmood:21757} Affect: {Affect:22436} Speech: {Speech:22432} Eye Contact: {Eye Contact:22433} Psychomotor Activity: {Motor Activity:22434} Thought Process: {thought process:22448}  Content/Perceptual Disturbances: {disturbances:22451} Orientation: {Orientation:22437} Cognition/Sensorium: {gbcognition:22449} Insight: {Insight:22446} Judgment: {Insight:22446}  Structured Assessment Results: The Patient Health Questionnaire-9 (PHQ-9) is a self-report measure that assesses symptoms and severity of depression over the course of the last two weeks. [name] obtained a score of *** suggesting {GBPHQ9SEVERITY:21752}. [name] finds the endorsed symptoms to be {gbphq9difficulty:21754}. Little interest or pleasure in doing things ***  Feeling down, depressed, or hopeless ***  Trouble falling or staying asleep, or sleeping too much ***  Feeling tired or having little energy ***  Poor appetite or overeating ***  Feeling bad about yourself --- or that you are a failure or have let yourself or your family down ***  Trouble concentrating on things, such as reading the newspaper or watching television ***  Moving or speaking so slowly that other people could have noticed? Or the opposite --- being so fidgety or restless that you have been moving around a lot more than usual ***  Thoughts that you would be better off dead or hurting yourself in some way ***  PHQ-9 Score ***    The Generalized Anxiety Disorder-7 (GAD-7) is a brief self-report measure that assesses symptoms of anxiety over the course of the last two weeks. Anthonia obtained a score of *** suggesting {gbgad7severity:21753}. [name] finds the endorsed symptoms to be {gbphq9difficulty:21754}. 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 [name] has demonstrated progress in her goal as evidenced by {gbtxprogress:22839}. [name] also reported {gbtxprogress2:22951}.  Plan: [name] continues to appear able and willing to participate as evidenced by engagement in reciprocal conversation, and asking questions for clarification as appropriate. The next appointment will be scheduled in {gbweeks:21758}, which will be via [employer]. The next session will focus on reviewing learned skills, and working towards the established treatment goal.***

## 2019-09-11 ENCOUNTER — Telehealth (INDEPENDENT_AMBULATORY_CARE_PROVIDER_SITE_OTHER): Payer: BC Managed Care – PPO | Admitting: Family Medicine

## 2019-09-11 ENCOUNTER — Encounter (INDEPENDENT_AMBULATORY_CARE_PROVIDER_SITE_OTHER): Payer: Self-pay | Admitting: Family Medicine

## 2019-09-11 ENCOUNTER — Other Ambulatory Visit: Payer: Self-pay

## 2019-09-11 DIAGNOSIS — R7303 Prediabetes: Secondary | ICD-10-CM | POA: Diagnosis not present

## 2019-09-11 DIAGNOSIS — E559 Vitamin D deficiency, unspecified: Secondary | ICD-10-CM | POA: Diagnosis not present

## 2019-09-11 DIAGNOSIS — Z6841 Body Mass Index (BMI) 40.0 and over, adult: Secondary | ICD-10-CM

## 2019-09-11 MED ORDER — ERGOCALCIFEROL 1.25 MG (50000 UT) PO CAPS: 50000.0000 [IU] | ORAL_CAPSULE | ORAL | 0 refills | Status: DC

## 2019-09-12 ENCOUNTER — Ambulatory Visit (INDEPENDENT_AMBULATORY_CARE_PROVIDER_SITE_OTHER): Payer: BC Managed Care – PPO | Admitting: Psychology

## 2019-09-12 ENCOUNTER — Encounter (INDEPENDENT_AMBULATORY_CARE_PROVIDER_SITE_OTHER): Payer: Self-pay

## 2019-09-12 ENCOUNTER — Telehealth (INDEPENDENT_AMBULATORY_CARE_PROVIDER_SITE_OTHER): Payer: Self-pay | Admitting: Psychology

## 2019-09-12 NOTE — Telephone Encounter (Signed)
  Office: [phone]  /  Fax: [phone]  Date of Call: September 12, 2019  Time of Call: 8:32am Provider: Glennie Isle, PsyD  CONTENT: This provider called Camyra to check-in as she did not present for today's Webex appointment at 8:30am. A HIPAA compliant voicemail was left requesting a call back. Of note, this provider stayed on the [facility] appointment for 7 minutes prior to signing off, which is 2 additional minutes past the clinic's 5 minute grace period policy.    PLAN: This provider will wait for Wilma to call back. If deemed necessary, this provider or the provider's clinic will call Maureen again in approximately one week.

## 2019-09-12 NOTE — Progress Notes (Signed)
Office: [phone]  /  Fax: [phone] TeleHealth Visit:  Wendy Levy has verbally consented to this TeleHealth visit today. The patient is located at home, the provider is located at the [employer] [facility] office. The participants in this visit include the listed provider and patient. The visit was conducted today via webex.  HPI:   Chief Complaint: OBESITY Wendy Levy is here to discuss her progress with her obesity treatment plan. She is on the keep a food journal with 250-350 calories and 20+ grams of protein at breakfast daily and follow the Category 2 plan and is following her eating plan approximately 30 % of the time. She states she is walking for 30-60 minutes 7 times per week. Wendy Levy reports feeling as though she has gained weight. She is not weighing at home. She voices she has been eating a good amount of Ramen noodles. She is eating at 10 am a peanut butter sandwich, then Ramen noodles around 3 pm.  We were unable to weigh the patient today for this TeleHealth visit. She feels as if she has gained weight since her last visit. She has lost 0 lbs since starting treatment with [place].  Vitamin D Deficiency Wendy Levy has a diagnosis of vitamin D deficiency. She states she hasn't been able to get a refill, and hasn't been taking Vit D. She notes fatigue and denies nausea, vomiting or muscle weakness.  Pre-Diabetes Wendy Levy has a diagnosis of pre-diabetes based on her elevated Hgb A1c and was informed this puts her at greater risk of developing diabetes. She is not taking metformin currently and notes carbohydrate cravings. She continues to work on diet and exercise to decrease risk of diabetes.   ASSESSMENT AND PLAN:  Vitamin D deficiency - Plan: ergocalciferol (VITAMIN D2) 1.25 MG (50000 UT) capsule  Prediabetes  Class 3 severe obesity with serious comorbidity and body mass index (BMI) of 45.0 to 49.9 in adult, unspecified obesity type (Wendy Levy)  PLAN:  Vitamin D Deficiency  Wendy Levy was informed that low vitamin D levels contributes to fatigue and are associated with obesity, breast, and colon cancer. Wendy Levy agrees to continue taking prescription Vit D 50,000 IU every week #4 and we will refill for 1 month. She will follow up for routine testing of vitamin D, at least 2-3 times per year. She was informed of the risk of over-replacement of vitamin D and agrees to not increase her dose unless she discusses this with [place] first. Wendy Levy agrees to follow up with our clinic in 2 weeks.  Pre-Diabetes Wendy Levy will continue to work on weight loss, exercise, and decreasing simple carbohydrates in her diet to help decrease the risk of diabetes. We dicussed metformin including benefits and risks. She was informed that eating too many simple carbohydrates or too many calories at one sitting increases the likelihood of GI side effects. Wendy Levy voices there is a refill at the pharmacy waiting, and she will pick it up and resume her medication. Wendy Levy agrees to follow up with [place] as directed to monitor her progress.  Obesity Wendy Levy is currently in the action stage of change. As such, her goal is to continue with weight loss efforts She has agreed to follow the Category 2 plan Wendy Levy has been instructed to work up to a goal of 150 minutes of combined cardio and strengthening exercise per week for weight loss and overall health benefits. We discussed the following Behavioral Modification Strategies today: increasing lean protein intake, increasing vegetables and work on meal planning and easy  cooking plans, keeping healthy foods in the home, and planning for success   Wendy Levy has agreed to follow up with our clinic in 2 weeks with [employer], FNP-C. She was informed of the importance of frequent follow up visits to maximize her success with intensive lifestyle modifications for her multiple health conditions.  ALLERGIES: Allergies  Allergen Reactions  . Camphor Rash    Rash with  topical product Rash with topical product    MEDICATIONS: Current Outpatient Medications on File Prior to Visit  Medication Sig Dispense Refill  . albuterol (PROVENTIL HFA;VENTOLIN HFA) 108 (90 Base) MCG/ACT inhaler Inhale 1-2 puffs into the lungs every 4 (four) hours as needed for wheezing or shortness of breath. 1 Inhaler 0  . baclofen (LIORESAL) 10 MG tablet Take 10 mg by mouth 2 (two) times daily.    . cetirizine (ZYRTEC) 10 MG tablet Take 10 mg by mouth daily as needed for allergies.    . hydrOXYzine (ATARAX/VISTARIL) 25 MG tablet Take 1 tablet (25 mg total) by mouth at bedtime as needed (sleep). 90 tablet 0  . lisdexamfetamine (VYVANSE) 60 MG capsule Take 1 capsule (60 mg total) by mouth every morning. 30 capsule 0  . medroxyPROGESTERone (DEPO-PROVERA) 400 MG/ML SUSP injection Inject into the muscle once.    Marland Kitchen PARoxetine (PAXIL) 40 MG tablet Take 1 tablet (40 mg total) by mouth daily. 90 tablet 0  . zonisamide (ZONEGRAN) 25 MG capsule TK 4 CS PO QD HS  1  . metFORMIN (GLUCOPHAGE) 500 MG tablet Take 1 tablet (500 mg total) by mouth daily with breakfast. 30 tablet 0   No current facility-administered medications on file prior to visit.     PAST MEDICAL HISTORY: Past Medical History:  Diagnosis Date  . ADHD (attention deficit hyperactivity disorder)   . Allergy   . Anxiety   . Asthma   . Constipation   . Depression   . Dysmenorrhea    Cramps periodically.   Marland Kitchen Dysthymic disorder   . History of chlamydia 2016  . Migraine headache   . Oppositional defiant disorder   . Pre-diabetes   . Shortness of breath   . Shoulder dislocation    bilateral  . Swelling of lower extremity   . Vitamin D deficiency   . Wrist fracture, bilateral     PAST SURGICAL HISTORY: Past Surgical History:  Procedure Laterality Date  . WISDOM TOOTH EXTRACTION  04/2013    SOCIAL HISTORY: Social History   Tobacco Use  . Smoking status: Never Smoker  . Smokeless tobacco: Never Used  Substance Use  Topics  . Alcohol use: Not Currently    Alcohol/week: 0.0 standard drinks    Comment: only on special occasions   . Drug use: No    FAMILY HISTORY: Family History  Adopted: Yes  Problem Relation Age of Onset  . Depression Sister        reportedly in and out of treatment facilities, also dx w/ RAD  . Suicidality Sister   . Obesity Mother     ROS: Review of Systems  Constitutional: Positive for malaise/fatigue. Negative for weight loss.  Gastrointestinal: Negative for nausea and vomiting.  Musculoskeletal:       Negative muscle weakness    PHYSICAL EXAM: Pt in no acute distress  RECENT LABS AND TESTS: BMET    Component Value Date/Time   NA 141 05/30/2019 1555   K 4.1 05/30/2019 1555   CL 104 05/30/2019 1555   CO2 20 05/30/2019 1555  GLUCOSE 65 05/30/2019 1555   GLUCOSE 79 04/28/2016 1643   BUN 9 05/30/2019 1555   CREATININE 0.74 05/30/2019 1555   CREATININE 0.72 04/28/2016 1643   CALCIUM 9.4 05/30/2019 1555   GFRNONAA 114 05/30/2019 1555   GFRNONAA >89 02/12/2014 1003   GFRAA 131 05/30/2019 1555   GFRAA >89 02/12/2014 1003   Lab Results  Component Value Date   HGBA1C 5.2 02/12/2014   Lab Results  Component Value Date   INSULIN 36.7 (H) 05/30/2019   CBC    Component Value Date/Time   WBC 12.3 (H) 11/02/2017 1745   WBC 13.4 (H) 04/28/2016 1643   RBC 5.01 11/02/2017 1745   RBC 5.11 (H) 04/28/2016 1643   HGB 14.3 11/02/2017 1745   HCT 42.3 11/02/2017 1745   PLT 441 (H) 11/02/2017 1745   MCV 84 11/02/2017 1745   MCH 28.5 11/02/2017 1745   MCH 27.4 04/28/2016 1643   MCHC 33.8 11/02/2017 1745   MCHC 32.9 04/28/2016 1643   RDW 14.7 11/02/2017 1745   LYMPHSABS 3.2 (H) 11/02/2017 1745   MONOABS 804 04/28/2016 1643   EOSABS 0.2 11/02/2017 1745   BASOSABS 0.0 11/02/2017 1745   Iron/TIBC/Ferritin/ %Sat No results found for: IRON, TIBC, FERRITIN, IRONPCTSAT Lipid Panel     Component Value Date/Time   CHOL 209 (H) 04/28/2016 1643   TRIG 315 (H)  04/28/2016 1643   HDL 51 04/28/2016 1643   CHOLHDL 4.1 04/28/2016 1643   VLDL 63 (H) 04/28/2016 1643   LDLCALC 95 04/28/2016 1643   Hepatic Function Panel     Component Value Date/Time   PROT 6.8 05/30/2019 1555   ALBUMIN 4.3 05/30/2019 1555   AST 20 05/30/2019 1555   ALT 25 05/30/2019 1555   ALKPHOS 88 05/30/2019 1555   BILITOT 0.4 05/30/2019 1555      Component Value Date/Time   TSH 2.070 11/02/2017 1745   TSH 1.44 04/28/2016 1643   TSH 2.304 02/05/2015 1258      I, Trixie Dredge, am acting as transcriptionist for Ilene Qua, MD  I have reviewed the above documentation for accuracy and completeness, and I agree with the above. - Ilene Qua, MD

## 2019-09-26 ENCOUNTER — Ambulatory Visit (INDEPENDENT_AMBULATORY_CARE_PROVIDER_SITE_OTHER): Payer: BC Managed Care – PPO | Admitting: Family Medicine

## 2019-10-04 ENCOUNTER — Other Ambulatory Visit (INDEPENDENT_AMBULATORY_CARE_PROVIDER_SITE_OTHER): Payer: Self-pay | Admitting: Family Medicine

## 2019-10-04 DIAGNOSIS — R7303 Prediabetes: Secondary | ICD-10-CM

## 2019-10-06 ENCOUNTER — Other Ambulatory Visit (INDEPENDENT_AMBULATORY_CARE_PROVIDER_SITE_OTHER): Payer: Self-pay | Admitting: Family Medicine

## 2019-10-06 DIAGNOSIS — R7303 Prediabetes: Secondary | ICD-10-CM

## 2019-11-13 ENCOUNTER — Encounter: Payer: Self-pay | Admitting: Neurology

## 2019-11-24 ENCOUNTER — Other Ambulatory Visit (HOSPITAL_COMMUNITY): Payer: Self-pay | Admitting: Psychiatry

## 2019-11-24 DIAGNOSIS — F341 Dysthymic disorder: Secondary | ICD-10-CM

## 2019-11-24 DIAGNOSIS — F4310 Post-traumatic stress disorder, unspecified: Secondary | ICD-10-CM

## 2019-12-06 ENCOUNTER — Other Ambulatory Visit: Payer: Self-pay

## 2019-12-06 ENCOUNTER — Encounter (HOSPITAL_COMMUNITY): Payer: Self-pay | Admitting: Psychiatry

## 2019-12-06 ENCOUNTER — Ambulatory Visit (INDEPENDENT_AMBULATORY_CARE_PROVIDER_SITE_OTHER): Payer: BC Managed Care – PPO | Admitting: Psychiatry

## 2019-12-06 DIAGNOSIS — F4310 Post-traumatic stress disorder, unspecified: Secondary | ICD-10-CM

## 2019-12-06 DIAGNOSIS — F5105 Insomnia due to other mental disorder: Secondary | ICD-10-CM | POA: Diagnosis not present

## 2019-12-06 DIAGNOSIS — F341 Dysthymic disorder: Secondary | ICD-10-CM | POA: Diagnosis not present

## 2019-12-06 DIAGNOSIS — F900 Attention-deficit hyperactivity disorder, predominantly inattentive type: Secondary | ICD-10-CM | POA: Diagnosis not present

## 2019-12-06 DIAGNOSIS — F99 Mental disorder, not otherwise specified: Secondary | ICD-10-CM

## 2019-12-06 MED ORDER — PAROXETINE HCL 40 MG PO TABS: 40.0000 mg | ORAL_TABLET | Freq: Every day | ORAL | 0 refills | Status: DC

## 2019-12-06 MED ORDER — LISDEXAMFETAMINE DIMESYLATE 60 MG PO CAPS: 60.0000 mg | ORAL_CAPSULE | ORAL | 0 refills | Status: DC

## 2019-12-06 MED ORDER — HYDROXYZINE HCL 25 MG PO TABS: 25.0000 mg | ORAL_TABLET | Freq: Every evening | ORAL | 0 refills | Status: DC | PRN

## 2019-12-06 NOTE — Progress Notes (Signed)
  Virtual Visit via Telephone Note  I connected with Wendy Levy  on 12/06/19 at 11:30 AM EST by telephone and verified that I am speaking with the correct person using two identifiers.  Location: Patient: in car driving Provider: office   I discussed the limitations, risks, security and privacy concerns of performing an evaluation and management service by telephone and the availability of in person appointments. I also discussed with the patient that there may be a patient responsible charge related to this service. The patient expressed understanding and agreed to proceed.   History of Present Illness: "I could be better. I ran out of meds 1 week ago". She states she always seems to run out of meds 1-2 weeks prior to each of our appointments. Wendy Levy has been feeling more emotional lately. 1-2 days a week she is sad and cries easily. Wendy Levy will isolate and spend all her time in bed. she has not been able to identify any triggers. Other days she is cheerful. With Vistaril she sleeps well. She is unable to sleep without it and has nightmares. Wendy Levy is no longer having angry outbursts. One month ago she witnessed a dog being run over. The dog died. She blames herself and often thinks about ways it could have turned out. She denies other symptoms of PTSD. Wendy Levy is not working right now. She has been experiencing a lot of nausea for the last 2 days. She denies SI/HI.  She has been feeling some hopelessness. She is lonely and feels like she has no body in her life right now.     Observations/Objective: I spoke with Wendy Levy on the phone.  Pt was calm, pleasant and cooperative.  Pt was engaged in the conversation and answered questions appropriately.  Speech was clear and coherent with normal rate, tone and volume.  Mood is depressed, affect is congruent. Thought processes are coherent, goal oriented and intact.  Thought content is logical.  Pt denies SI/HI.   Pt denies auditory and visual  hallucinations and did not appear to be responding to internal stimuli.  Memory and concentration are good.  Fund of knowledge and use of language are average.  Insight and judgment are fair.  I am unable to comment on psychomotor activity, general appearance, hygiene, or eye contact as I was unable to physically see the patient on the phone.  Vital signs not available since interview conducted virtually.     Assessment and Plan: ADHD-combined type; dysthymic disorder; PTSD; insomnia; oppositional defiant disorder  Status of current symptoms: worsening of mood for 1 week without meds  Vistaril 25mg  po qHS prn insomnia  Vyvanse 60mg  po qAM for ADHD  Paxil 40mg  po qD for mood and PTSD    Follow Up Instructions: In 6 weeks or sooner if needed   I discussed the assessment and treatment plan with the patient. The patient was provided an opportunity to ask questions and all were answered. The patient agreed with the plan and demonstrated an understanding of the instructions.   The patient was advised to call back or seek an in-person evaluation if the symptoms worsen or if the condition fails to improve as anticipated.  I provided 15 minutes of non-face-to-face time during this encounter.   [name], MD

## 2020-01-24 ENCOUNTER — Ambulatory Visit (INDEPENDENT_AMBULATORY_CARE_PROVIDER_SITE_OTHER): Payer: BC Managed Care – PPO | Admitting: Psychiatry

## 2020-01-24 ENCOUNTER — Other Ambulatory Visit: Payer: Self-pay

## 2020-01-24 ENCOUNTER — Encounter (HOSPITAL_COMMUNITY): Payer: Self-pay | Admitting: Psychiatry

## 2020-01-24 DIAGNOSIS — F99 Mental disorder, not otherwise specified: Secondary | ICD-10-CM

## 2020-01-24 DIAGNOSIS — F900 Attention-deficit hyperactivity disorder, predominantly inattentive type: Secondary | ICD-10-CM | POA: Diagnosis not present

## 2020-01-24 DIAGNOSIS — F5105 Insomnia due to other mental disorder: Secondary | ICD-10-CM

## 2020-01-24 DIAGNOSIS — F4310 Post-traumatic stress disorder, unspecified: Secondary | ICD-10-CM

## 2020-01-24 DIAGNOSIS — F341 Dysthymic disorder: Secondary | ICD-10-CM

## 2020-01-24 MED ORDER — HYDROXYZINE HCL 25 MG PO TABS: 25.0000 mg | ORAL_TABLET | Freq: Every evening | ORAL | 0 refills | Status: DC | PRN

## 2020-01-24 MED ORDER — LISDEXAMFETAMINE DIMESYLATE 60 MG PO CAPS: 60.0000 mg | ORAL_CAPSULE | ORAL | 0 refills | Status: DC

## 2020-01-24 MED ORDER — PAROXETINE HCL 40 MG PO TABS: 40.0000 mg | ORAL_TABLET | Freq: Every day | ORAL | 0 refills | Status: DC

## 2020-01-24 NOTE — Progress Notes (Signed)
Virtual Visit via Telephone Note  I connected with Wendy Levy on 01/24/20 at  3:00 PM EST by telephone and verified that I am speaking with the correct person using two identifiers.  Location: Patient: home Provider: office   I discussed the limitations, risks, security and privacy concerns of performing an evaluation and management service by telephone and the availability of in person appointments. I also discussed with the patient that there may be a patient responsible charge related to this service. The patient expressed understanding and agreed to proceed.   History of Present Illness: "Better". She has moved to [name] with her dad. Her depression is slowly improving. She is not getting down on herself like she was before. Wendy Levy is active and motivated. She is hopeful and denies SI/HI. Sleep is disturbed due to heat. Wendy Levy reports anger and irritability are not as bad. She does get irritable sometimes. Her PTSD is well controlled. ADHD is well controlled with Vyvanse. She is not working right now.    Observations/Objective:  General Appearance: Unable to assess  Eye Contact:  Unable to assess  Speech:  Clear and Coherent and Normal Rate  Volume:  Normal  Mood:  Euthymic  Affect:  Full Range  Thought Process:  Coherent and Descriptions of Associations: Intact, concrete  Orientation:  Full (Time, Place, and Person)  Thought Content:  Logical  Suicidal Thoughts:  No  Homicidal Thoughts:  No  Memory:  Immediate;   Fair  Judgement:  Fair  Insight:  Fair  Psychomotor Activity:  Unable to assess  Concentration:  Concentration: Poor  Recall:  Poor  Fund of Knowledge:  Fair  Language:  Fair  Akathisia:  Unable to assess  Handed:  Right  AIMS (if indicated):     Assets:  Communication Skills Desire for Improvement Financial Resources/Insurance Housing Social Support  ADL's: Unable to assess  Cognition:  WNL  Sleep:        Assessment and Plan:  1. Insomnia due to  other mental disorder  - hydrOXYzine (ATARAX/VISTARIL) 25 MG tablet; Take 1 tablet (25 mg total) by mouth at bedtime as needed (sleep).  Dispense: 90 tablet; Refill: 0  2. Attention deficit hyperactivity disorder (ADHD), predominantly inattentive type  - lisdexamfetamine (VYVANSE) 60 MG capsule; Take 1 capsule (60 mg total) by mouth every morning.  Dispense: 30 capsule; Refill: 0 - lisdexamfetamine (VYVANSE) 60 MG capsule; Take 1 capsule (60 mg total) by mouth every morning.  Dispense: 30 capsule; Refill: 0 - lisdexamfetamine (VYVANSE) 60 MG capsule; Take 1 capsule (60 mg total) by mouth every morning.  Dispense: 30 capsule; Refill: 0  3. PTSD (post-traumatic stress disorder)  - PARoxetine (PAXIL) 40 MG tablet; Take 1 tablet (40 mg total) by mouth daily.  Dispense: 90 tablet; Refill: 0  4. Dysthymic disorder  - PARoxetine (PAXIL) 40 MG tablet; Take 1 tablet (40 mg total) by mouth daily.  Dispense: 90 tablet; Refill: 0    Follow Up Instructions: In 3 months or sooner if needed   I discussed the assessment and treatment plan with the patient. The patient was provided an opportunity to ask questions and all were answered. The patient agreed with the plan and demonstrated an understanding of the instructions.   The patient was advised to call back or seek an in-person evaluation if the symptoms worsen or if the condition fails to improve as anticipated.  I provided 20 minutes of non-face-to-face time during this encounter.   Wendy Cradle, MD

## 2020-04-11 ENCOUNTER — Other Ambulatory Visit (HOSPITAL_COMMUNITY): Payer: Self-pay | Admitting: Psychiatry

## 2020-04-11 DIAGNOSIS — F4310 Post-traumatic stress disorder, unspecified: Secondary | ICD-10-CM

## 2020-04-11 DIAGNOSIS — F341 Dysthymic disorder: Secondary | ICD-10-CM

## 2020-04-24 ENCOUNTER — Telehealth (INDEPENDENT_AMBULATORY_CARE_PROVIDER_SITE_OTHER): Payer: BC Managed Care – PPO | Admitting: Psychiatry

## 2020-04-24 ENCOUNTER — Other Ambulatory Visit: Payer: Self-pay

## 2020-04-24 ENCOUNTER — Encounter (HOSPITAL_COMMUNITY): Payer: Self-pay | Admitting: Psychiatry

## 2020-04-24 DIAGNOSIS — F4310 Post-traumatic stress disorder, unspecified: Secondary | ICD-10-CM

## 2020-04-24 DIAGNOSIS — F99 Mental disorder, not otherwise specified: Secondary | ICD-10-CM

## 2020-04-24 DIAGNOSIS — F900 Attention-deficit hyperactivity disorder, predominantly inattentive type: Secondary | ICD-10-CM | POA: Diagnosis not present

## 2020-04-24 DIAGNOSIS — F5105 Insomnia due to other mental disorder: Secondary | ICD-10-CM

## 2020-04-24 DIAGNOSIS — F341 Dysthymic disorder: Secondary | ICD-10-CM | POA: Diagnosis not present

## 2020-04-24 MED ORDER — HYDROXYZINE HCL 25 MG PO TABS: 25.0000 mg | ORAL_TABLET | Freq: Every evening | ORAL | 0 refills | Status: DC | PRN

## 2020-04-24 MED ORDER — LISDEXAMFETAMINE DIMESYLATE 60 MG PO CAPS: 60.0000 mg | ORAL_CAPSULE | ORAL | 0 refills | Status: DC

## 2020-04-24 MED ORDER — PAROXETINE HCL 40 MG PO TABS: 40.0000 mg | ORAL_TABLET | Freq: Every day | ORAL | 0 refills | Status: DC

## 2020-04-24 NOTE — Progress Notes (Signed)
Virtual Visit via Telephone Note  I connected with Wendy Levy on 04/24/20 at  3:30 PM EDT by telephone and verified that I am speaking with the correct person using two identifiers.  Location: Patient: home Provider: office   I discussed the limitations, risks, security and privacy concerns of performing an evaluation and management service by telephone and the availability of in person appointments. I also discussed with the patient that there may be a patient responsible charge related to this service. The patient expressed understanding and agreed to proceed.   History of Present Illness: Pt reports she ran out of Paxil 2 weeks ago. The next day she ended up in the ED with diarrhea. She has not restarted it yet. She also ran out Vistaril and started taking Advil PM to sleep. She has occasional nightmares. She has difficulty going into crowds. Wendy Levy is denying depression, hopelessness and anhedonia. She denies SI/HI. Vyvanse is helping with ADHD.    Observations/Objective:  General Appearance: unable to assess  Eye Contact:  unable to assess  Speech:  Clear and Coherent and Normal Rate  Volume:  Normal  Mood:  Euthymic  Affect:  Full Range  Thought Process:  Goal Directed, Linear and Descriptions of Associations: Intact  Orientation:  Full (Time, Place, and Person)  Thought Content:  Logical  Suicidal Thoughts:  No  Homicidal Thoughts:  No  Memory:  Immediate;   Good  Judgement:  Good  Insight:  Good  Psychomotor Activity: unable to assess  Concentration:  Concentration: Good  Recall:  Good  Fund of Knowledge:  Good  Language:  Good  Akathisia:  unable to assess  Handed:  Right  AIMS (if indicated):     Assets:  Communication Skills Desire for Improvement Financial Resources/Insurance Housing Intimacy Leisure Time Social Support Talents/Skills Transportation Vocational/Educational  ADL's:  unable to assess  Cognition:  WNL  Sleep:         Assessment and  Plan: ADHD- inattentive type; PTSD; Dysthymic d/o; Insomnia  Paxil 40mg  po qD  Vyvanse 60mg  po qD  Vistaril 25mg  po qHS prn insomnia  Follow Up Instructions: In 2-3 months or sooner if needed   I discussed the assessment and treatment plan with the patient. The patient was provided an opportunity to ask questions and all were answered. The patient agreed with the plan and demonstrated an understanding of the instructions.   The patient was advised to call back or seek an in-person evaluation if the symptoms worsen or if the condition fails to improve as anticipated.  I provided 10 minutes of non-face-to-face time during this encounter.   [name], MD

## 2020-04-25 ENCOUNTER — Telehealth (HOSPITAL_COMMUNITY): Payer: Self-pay

## 2020-04-25 NOTE — Telephone Encounter (Signed)
Received a fax from the pharmacy stating that patient's VYVANSE 60MG  CAPSULE IS NOT COVERED BY INSURANCE.  GUANFACINE 1MG  TABLET 24HR IS COVERED AND DOES NOT REQUIRE A PA. Do want a PA done on the Vyvanse or switch patient to Guanfacine? Please review and advise. Thank you.

## 2020-04-26 NOTE — Telephone Encounter (Signed)
PA- been on Vyvanse for years and it is effective

## 2020-04-30 ENCOUNTER — Telehealth (HOSPITAL_COMMUNITY): Payer: Self-pay

## 2020-04-30 NOTE — Telephone Encounter (Signed)
[facility] [place] 60MG  CAPSULES PA# [id number] EFFECTIVE 04/30/2020 TO 05/01/2023

## 2020-07-10 ENCOUNTER — Other Ambulatory Visit: Payer: Self-pay

## 2020-07-10 ENCOUNTER — Telehealth (HOSPITAL_COMMUNITY): Payer: BC Managed Care – PPO | Admitting: Psychiatry

## 2020-07-10 ENCOUNTER — Other Ambulatory Visit (HOSPITAL_COMMUNITY): Payer: Self-pay | Admitting: Psychiatry

## 2020-07-10 DIAGNOSIS — F4310 Post-traumatic stress disorder, unspecified: Secondary | ICD-10-CM

## 2020-07-10 DIAGNOSIS — F900 Attention-deficit hyperactivity disorder, predominantly inattentive type: Secondary | ICD-10-CM

## 2020-07-10 DIAGNOSIS — F5105 Insomnia due to other mental disorder: Secondary | ICD-10-CM

## 2020-07-10 DIAGNOSIS — F341 Dysthymic disorder: Secondary | ICD-10-CM

## 2020-07-10 DIAGNOSIS — F99 Mental disorder, not otherwise specified: Secondary | ICD-10-CM

## 2020-07-10 MED ORDER — LISDEXAMFETAMINE DIMESYLATE 60 MG PO CAPS: 60.0000 mg | ORAL_CAPSULE | ORAL | 0 refills | Status: DC

## 2020-07-10 MED ORDER — HYDROXYZINE HCL 25 MG PO TABS: 25.0000 mg | ORAL_TABLET | Freq: Every evening | ORAL | 0 refills | Status: DC | PRN

## 2020-07-10 MED ORDER — PAROXETINE HCL 40 MG PO TABS: 40.0000 mg | ORAL_TABLET | Freq: Every day | ORAL | 0 refills | Status: DC

## 2020-07-31 ENCOUNTER — Telehealth (INDEPENDENT_AMBULATORY_CARE_PROVIDER_SITE_OTHER): Payer: BC Managed Care – PPO | Admitting: Psychiatry

## 2020-07-31 ENCOUNTER — Other Ambulatory Visit: Payer: Self-pay

## 2020-07-31 DIAGNOSIS — F4310 Post-traumatic stress disorder, unspecified: Secondary | ICD-10-CM | POA: Diagnosis not present

## 2020-07-31 NOTE — Progress Notes (Signed)
Virtual Visit via Telephone Note  I connected with Wendy Levy on 07/31/20 at  3:00 PM EDT by telephone and verified that I am speaking with the correct person using two identifiers.  Location: Patient: home Provider: office   I discussed the limitations, risks, security and privacy concerns of performing an evaluation and management service by telephone and the availability of in person appointments. I also discussed with the patient that there may be a patient responsible charge related to this service. The patient expressed understanding and agreed to proceed.   History of Present Illness: Wendy Levy is doing well. She and her boyfriend have moved into a motel and plan to stay there for a while. Wendy Levy is happy. She denies depression. She has some situational anxiety but it is not constant. She is sleeping well at night and during the day she naps for 2 hrs. Work is going well and the Vyvanse remains effective. When driving she gets anxious and intrusive memories when cars come from certain directions. She will use breathing techniques to calm down. She has not had to stop or pull over due to the anxiety response. Wendy Levy denies SI/HI.    Observations/Objective:  General Appearance: unable to assess  Eye Contact:  unable to assess  Speech:  Clear and Coherent and Normal Rate  Volume:  Normal  Mood:  Euthymic  Affect:  Full Range  Thought Process:  Goal Directed and Descriptions of Associations: Intact  Orientation:  Full (Time, Place, and Person)  Thought Content:  Logical  Suicidal Thoughts:  No  Homicidal Thoughts:  No  Memory:  Immediate;   Good  Judgement:  Good  Insight:  Good  Psychomotor Activity: unable to assess  Concentration:  Concentration: Good  Recall:  Good  Fund of Knowledge:  Good  Language:  Good  Akathisia:  unable to assess  Handed:  Right  AIMS (if indicated):     Assets:  Desire for Improvement Financial Resources/Insurance Housing Intimacy Leisure  Time Social Support Talents/Skills Vocational/Educational  ADL's:  unable to assess  Cognition:  WNL  Sleep:         Assessment and Plan:  ADHD- inattentive type; PTSD; Dysthymic d/o; Insomia  No refills today- she has not filled previous scripts due to lack of funds  Paxil 40mg  po qD  Vyvanse 60mg  po qD  Vistaril 25mg  po qHS prn insomnia  Follow Up Instructions: In 2-3 months or sooner if needed   I discussed the assessment and treatment plan with the patient. The patient was provided an opportunity to ask questions and all were answered. The patient agreed with the plan and demonstrated an understanding of the instructions.   The patient was advised to call back or seek an in-person evaluation if the symptoms worsen or if the condition fails to improve as anticipated.  I provided 10 minutes of non-face-to-face time during this encounter.   [name], MD

## 2020-10-16 ENCOUNTER — Telehealth (HOSPITAL_COMMUNITY): Payer: BC Managed Care – PPO | Admitting: Psychiatry

## 2020-10-16 ENCOUNTER — Other Ambulatory Visit: Payer: Self-pay

## 2020-10-16 DIAGNOSIS — F341 Dysthymic disorder: Secondary | ICD-10-CM

## 2020-10-16 DIAGNOSIS — F5105 Insomnia due to other mental disorder: Secondary | ICD-10-CM

## 2020-10-16 DIAGNOSIS — F4310 Post-traumatic stress disorder, unspecified: Secondary | ICD-10-CM

## 2020-10-16 DIAGNOSIS — F900 Attention-deficit hyperactivity disorder, predominantly inattentive type: Secondary | ICD-10-CM

## 2020-10-16 NOTE — Progress Notes (Unsigned)
"  I'm good". She was recently diagnosed with diabetes. It's depressing but she is working on weight loss. She is walking and dieting. She is drinking more water and avoiding soda. Wendy Levy is trying to surround herself with positive people which helps with her mood alot. She is sleeping better. She denies SI/HI. [name] denies flashbacks and nightmares. ADHD is well controlled.   Refill meds Feb 3 at 4.30  (10 min)

## 2020-10-17 MED ORDER — LISDEXAMFETAMINE DIMESYLATE 60 MG PO CAPS: 60.0000 mg | ORAL_CAPSULE | ORAL | 0 refills | Status: DC

## 2020-10-17 MED ORDER — HYDROXYZINE HCL 25 MG PO TABS: 25.0000 mg | ORAL_TABLET | Freq: Every evening | ORAL | 0 refills | Status: DC | PRN

## 2020-10-17 MED ORDER — PAROXETINE HCL 40 MG PO TABS: 40.0000 mg | ORAL_TABLET | Freq: Every day | ORAL | 0 refills | Status: DC

## 2021-01-01 ENCOUNTER — Telehealth (INDEPENDENT_AMBULATORY_CARE_PROVIDER_SITE_OTHER): Payer: BC Managed Care – PPO | Admitting: Psychiatry

## 2021-01-01 ENCOUNTER — Other Ambulatory Visit: Payer: Self-pay

## 2021-01-01 DIAGNOSIS — F4310 Post-traumatic stress disorder, unspecified: Secondary | ICD-10-CM | POA: Diagnosis not present

## 2021-01-01 DIAGNOSIS — F341 Dysthymic disorder: Secondary | ICD-10-CM | POA: Diagnosis not present

## 2021-01-01 DIAGNOSIS — F913 Oppositional defiant disorder: Secondary | ICD-10-CM | POA: Diagnosis not present

## 2021-01-01 DIAGNOSIS — F900 Attention-deficit hyperactivity disorder, predominantly inattentive type: Secondary | ICD-10-CM | POA: Diagnosis not present

## 2021-01-01 DIAGNOSIS — F5105 Insomnia due to other mental disorder: Secondary | ICD-10-CM

## 2021-01-01 DIAGNOSIS — F99 Mental disorder, not otherwise specified: Secondary | ICD-10-CM

## 2021-01-01 MED ORDER — HYDROXYZINE HCL 25 MG PO TABS: 25.0000 mg | ORAL_TABLET | Freq: Every evening | ORAL | 0 refills | Status: DC | PRN

## 2021-01-01 MED ORDER — PAROXETINE HCL 40 MG PO TABS: 40.0000 mg | ORAL_TABLET | Freq: Every day | ORAL | 0 refills | Status: DC

## 2021-01-01 NOTE — Progress Notes (Signed)
Virtual Visit via Telephone Note  I connected with Cardell Peach on 01/01/21 at  4:30 PM EST by telephone and verified that I am speaking with the correct person using two identifiers.  Location: Patient: home Provider: office   I discussed the limitations, risks, security and privacy concerns of performing an evaluation and management service by telephone and the availability of in person appointments. I also discussed with the patient that there may be a patient responsible charge related to this service. The patient expressed understanding and agreed to proceed.   History of Present Illness: "I have been doin' alright". Her car died on 12-24-23 and now she is relying on a friend to take her places. [name] notes that the loss of freedom has made her somewhat depressed. She is not working right now. Her other concern is she is going to lose her insurance when she turns 30 in April. [name] notes that due to lack of funds she was not able to fill Vyvanse and as a result she missed at least 4+ weeks of it. [name] notes that during this time she was more depressed, withdrawn, feeling out of place and zoning out. She restarted Paxil and Vyvanse about 1 week and notes her mood is starting to improve a little. Her anxiety is mildly increased and sometimes she feels restless. She has mild racing thoughts but it is not bothersome. [name]'s sleep is ok most nights but 1-2x/week she will wake in the middle of the night and then it takes her hours to fall back asleep. Her PTSD is under control and she has not experienced any symptoms lately. Her irritability is severe.    Observations/Objective:  General Appearance: unable to assess  Eye Contact:  unable to assess  Speech:  Clear and Coherent and Normal Rate  Volume:  Normal  Mood:  Anxious and Depressed  Affect:  Appropriate and Congruent  Thought Process:  Coherent and Descriptions of Associations: Circumstantial  Orientation:  Full (Time, Place, and Person)   Thought Content:  Logical  Suicidal Thoughts:  No  Homicidal Thoughts:  No  Memory:  Immediate;   Good  Judgement:  Fair  Insight:  Good  Psychomotor Activity: unable to assess  Concentration:  Concentration: Good  Recall:  Good  Fund of Knowledge:  Good  Language:  Good  Akathisia:  unable to assess  Handed:  Right  AIMS (if indicated):     Assets:  Communication Skills Desire for Improvement Financial Resources/Insurance [place] Talents/Skills Transportation Vocational/Educational  ADL's:  unable to assess  Cognition:  WNL  Sleep:        I reviewed the information below on 01/01/21 and have updated it. Assessment and Plan:   ADHD- combined type; Dysthymic d/o; ODD; PTSD; Insomnia   Vistaril 25mg  po qS prn insomnia  Vyvanse 60mg  po qAM - no refills today as she has 2 scripts she has not yet filled  Paxil 40mg  po qD for mood and PTSD    Follow Up Instructions: In 2 months or sooner if needed   I discussed the assessment and treatment plan with the patient. The patient was provided an opportunity to ask questions and all were answered. The patient agreed with the plan and demonstrated an understanding of the instructions.   The patient was advised to call back or seek an in-person evaluation if the symptoms worsen or if the condition fails to improve as anticipated.  I provided 15 minutes of non-face-to-face time during this  encounter.   [name], MD

## 2021-03-05 ENCOUNTER — Other Ambulatory Visit: Payer: Self-pay

## 2021-03-05 ENCOUNTER — Encounter (HOSPITAL_COMMUNITY): Payer: Self-pay | Admitting: Psychiatry

## 2021-03-05 ENCOUNTER — Telehealth (INDEPENDENT_AMBULATORY_CARE_PROVIDER_SITE_OTHER): Payer: BC Managed Care – PPO | Admitting: Psychiatry

## 2021-03-05 DIAGNOSIS — F5105 Insomnia due to other mental disorder: Secondary | ICD-10-CM | POA: Diagnosis not present

## 2021-03-05 DIAGNOSIS — F900 Attention-deficit hyperactivity disorder, predominantly inattentive type: Secondary | ICD-10-CM

## 2021-03-05 DIAGNOSIS — F99 Mental disorder, not otherwise specified: Secondary | ICD-10-CM

## 2021-03-05 DIAGNOSIS — F4310 Post-traumatic stress disorder, unspecified: Secondary | ICD-10-CM

## 2021-03-05 DIAGNOSIS — F341 Dysthymic disorder: Secondary | ICD-10-CM

## 2021-03-05 MED ORDER — HYDROXYZINE HCL 25 MG PO TABS
25.0000 mg | ORAL_TABLET | Freq: Every evening | ORAL | 0 refills | Status: DC | PRN
Start: 2021-03-05 — End: 2024-04-02

## 2021-03-05 MED ORDER — LISDEXAMFETAMINE DIMESYLATE 60 MG PO CAPS: 60.0000 mg | ORAL_CAPSULE | ORAL | 0 refills | Status: DC

## 2021-03-05 MED ORDER — PAROXETINE HCL 40 MG PO TABS: 40.0000 mg | ORAL_TABLET | Freq: Every day | ORAL | 0 refills | Status: DC

## 2021-03-05 NOTE — Progress Notes (Signed)
Virtual Visit via Telephone Note  I connected with Wendy Levy on 03/05/21 at  3:30 PM EDT by telephone and verified that I am speaking with the correct person using two identifiers.  Location: Patient: home Provider: office   I discussed the limitations, risks, security and privacy concerns of performing an evaluation and management service by telephone and the availability of in person appointments. I also discussed with the patient that there may be a patient responsible charge related to this service. The patient expressed understanding and agreed to proceed.   History of Present Illness: Wendy Levy shares that she is doing well. Her depression has improved. She has only been depressed about 5 days out of the last 2 weeks. On those days she isolate and spends time in bed. Other days she is active and motivated. She is hiking a lot. Her sleep was good until a few days. She has been waking up in the middle of the night and was unable to go back to sleep. She is tired but does not let it affect her day. Her appetite is good. Her concentration is fair. She still procrastinates a lot. Wendy Levy does not think she is anxious but notes that she is tapping her foot or picking at her fingers. Her PTSD is "pretty much nonexistent". Her anger and lashing out has significantly decreased. She does a lot of deep breathing and it helps.    Observations/Objective:  General Appearance: unable to assess  Eye Contact:  unable to assess  Speech:  Clear and Coherent and Normal Rate  Volume:  Normal  Mood:  Euthymic  Affect:  Full Range  Thought Process:  Goal Directed, Linear and Descriptions of Associations: Intact  Orientation:  Full (Time, Place, and Person)  Thought Content:  Logical  Suicidal Thoughts:  No  Homicidal Thoughts:  No  Memory:  Immediate;   Good  Judgement:  Good  Insight:  Good  Psychomotor Activity: unable to assess  Concentration:  Concentration: Good  Recall:  Good  Fund of Knowledge:   Good  Language:  Good  Akathisia:  unable to assess  Handed:  Right  AIMS (if indicated):     Assets:  Communication Skills Desire for Improvement Housing Intimacy Leisure Time Resilience Social Support Talents/Skills Transportation Vocational/Educational  ADL's:  unable to assess  Cognition:  WNL  Sleep:         Assessment and Plan: Depression screen [facility] 2/9 03/05/2021 05/30/2019 05/18/2018 11/02/2017 11/02/2017  Decreased Interest 0 0 0 3 -  Down, Depressed, Hopeless 1 1 0 3 2  PHQ - 2 Score 1 1 0 6 2  Altered sleeping 1 2 - 0 -  Tired, decreased energy 1 3 - 2 -  Change in appetite 0 1 - 0 -  Feeling bad or failure about yourself  0 2 - 1 -  Trouble concentrating 0 1 - 0 -  Moving slowly or fidgety/restless 0 1 - 0 -  Suicidal thoughts 0 0 - 0 -  PHQ-9 Score 3 11 - 9 -  Difficult doing work/chores Not difficult at all Not difficult at all - Not difficult at all -    Flowsheet Row Video Visit from 03/05/2021 in [facility]  C-SSRS RISK CATEGORY No Risk     1. Insomnia due to other mental disorder - hydrOXYzine (ATARAX/VISTARIL) 25 MG tablet; Take 1 tablet (25 mg total) by mouth at bedtime as needed (sleep).  Dispense: 90 tablet; Refill: 0  2. Attention  deficit hyperactivity disorder (ADHD), predominantly inattentive type - lisdexamfetamine (VYVANSE) 60 MG capsule; Take 1 capsule (60 mg total) by mouth every morning.  Dispense: 30 capsule; Refill: 0 - lisdexamfetamine (VYVANSE) 60 MG capsule; Take 1 capsule (60 mg total) by mouth every morning.  Dispense: 30 capsule; Refill: 0 - lisdexamfetamine (VYVANSE) 60 MG capsule; Take 1 capsule (60 mg total) by mouth every morning.  Dispense: 30 capsule; Refill: 0  3. PTSD (post-traumatic stress disorder) - PARoxetine (PAXIL) 40 MG tablet; Take 1 tablet (40 mg total) by mouth daily.  Dispense: 90 tablet; Refill: 0  4. Dysthymic disorder - PARoxetine (PAXIL) 40 MG tablet; Take 1 tablet (40  mg total) by mouth daily.  Dispense: 90 tablet; Refill: 0    Follow Up Instructions: In 3 months or sooner if needed   I discussed the assessment and treatment plan with the patient. The patient was provided an opportunity to ask questions and all were answered. The patient agreed with the plan and demonstrated an understanding of the instructions.   The patient was advised to call back or seek an in-person evaluation if the symptoms worsen or if the condition fails to improve as anticipated.  I provided 8 minutes of non-face-to-face time during this encounter.   [name], MD

## 2021-05-28 ENCOUNTER — Other Ambulatory Visit: Payer: Self-pay

## 2021-05-28 ENCOUNTER — Telehealth (HOSPITAL_COMMUNITY): Payer: Self-pay | Admitting: Psychiatry

## 2021-05-28 ENCOUNTER — Telehealth (HOSPITAL_COMMUNITY): Payer: BC Managed Care – PPO | Admitting: Psychiatry

## 2021-05-28 NOTE — Telephone Encounter (Signed)
I called the patient at our scheduled appointment time. There was no answer. I left a voice message for patient to call the clinic back at their convinence.   

## 2022-07-07 ENCOUNTER — Encounter (INDEPENDENT_AMBULATORY_CARE_PROVIDER_SITE_OTHER): Payer: Self-pay

## 2023-04-04 ENCOUNTER — Encounter (HOSPITAL_COMMUNITY): Payer: Self-pay

## 2023-04-04 ENCOUNTER — Encounter (INDEPENDENT_AMBULATORY_CARE_PROVIDER_SITE_OTHER): Payer: Self-pay

## 2023-06-29 ENCOUNTER — Telehealth: Payer: Self-pay | Admitting: Neurology

## 2023-06-29 NOTE — Telephone Encounter (Signed)
Received OSA referral from [employer] at [facility]. Placed in sleep referrals box

## 2023-08-17 ENCOUNTER — Ambulatory Visit: Payer: Medicaid Other | Admitting: Neurology

## 2023-08-17 ENCOUNTER — Encounter: Payer: Self-pay | Admitting: Neurology

## 2023-08-17 VITALS — BP 147/101 | HR 74 | Ht 64.0 in | Wt 277.0 lb

## 2023-08-17 DIAGNOSIS — I158 Other secondary hypertension: Secondary | ICD-10-CM | POA: Insufficient documentation

## 2023-08-17 DIAGNOSIS — R519 Headache, unspecified: Secondary | ICD-10-CM | POA: Diagnosis not present

## 2023-08-17 DIAGNOSIS — R0683 Snoring: Secondary | ICD-10-CM | POA: Insufficient documentation

## 2023-08-17 DIAGNOSIS — G4733 Obstructive sleep apnea (adult) (pediatric): Secondary | ICD-10-CM | POA: Diagnosis not present

## 2023-08-17 DIAGNOSIS — E662 Morbid (severe) obesity with alveolar hypoventilation: Secondary | ICD-10-CM | POA: Insufficient documentation

## 2023-08-17 MED ORDER — ALPRAZOLAM 0.5 MG PO TABS: 0.5000 mg | ORAL_TABLET | Freq: Every evening | ORAL | 0 refills | Status: DC | PRN

## 2023-08-17 NOTE — Progress Notes (Signed)
SLEEP MEDICINE CLINIC    Provider:  Melvyn Novas, MD  Primary Care Physician:  Porfirio Oar, PA [address] [place] [address]     Referring Provider: Porfirio Oar, Pa [address] [place],  [place] [address]          Chief Complaint according to patient   Patient presents with:     New Patient (Initial Visit) formerly seen in 2020,           HISTORY OF PRESENT ILLNESS:  Wendy Levy is a 27 y.o. female patient who is this time seen upon referral on 08/17/2023 from [facility],  for a new sleep consult.  Chief concern according to patient :  remaining untreated for sleep apnea, moved to [facility] 2020, had no transportation.  "01/14/19 10:36 AM Hey Ms. Byer, Most likely this is related to your apnea being untreated. In Aug when we reviewed your sleep study it showed moderate sleep apnea, It was 22.4 times an hour where you would stop breathing and have a shallow breath. Dr Pearse Shiffler wanted to bring you in for a titration study. Our sleep lab attempted to reach out several times and was unable to reach you to schedule this study. The number to contact to schedule for this study is [phone]. Please contact them to schedule the titration study. Once we are able to get you on CPAP with the pressure needed to treat your apnea, you may find that you will start to feel better.  Thanks, Baird Lyons RN. "   I have the pleasure of seeing Wendy Levy on 08/17/23. A previously seen  female with a previously diagnosed sleep apnea disorder. She has now Type 2 DM, has HTN, is morbidly obese, is under psychiatric care.  HISTORY:  Wendy Levy is a 27 y.o. year old Caucasian female patient seen face to face upon a referral on 06/12/2019 (Dr. Rinaldo Ratel) for a sleep consultation.   Wendy Levy is a right -handed  Caucasian female with a medical history of ADHD (attention deficit hyperactivity disorder), Allergy, Anxiety, Asthma,  Constipation, Depression, Dysmenorrhea, Dysthymic disorder, Migraine headache, Oppositional defiant disorder, Pre-diabetes, Shortness of breath, Shoulder dislocation, Swelling of lower extremity, Vitamin D deficiency, and Wrist fracture, bilateral. She has been treated for morbid obesity.   Sleep relevant medical history: Parents and boyfriend have noted her to stop breathing. She snores. She is treated for depression, anxiety and has cyclic sleep problems.  Weight gain- related to depression, and biological parents are obese. She has biological siblings, 4 sisters and 12 brothers. She is now living with her biological father who states she snores. She feels tired.  No other family member on CPAP with OSA, insomnia, sleep walking.  Caffeine intake in form of Soda ("a lot - 4 cans a day"), no regular exercise, she just started to walk. The patient goes to bed at 10 PM- and it" takes 60 minutes" for her "meds to kick in". And continues to sleep for 2 hours, wakes at 1 AM and is not sure why- has racing thoughts, but she is not worrying a lot.  Rare bathroom breaks. Bedroom is cool, quiet and dark.  The preferred sleep position is supine and lateral, with the support of 3 pillows. Dreams are reportedly rare now on meds.    8.30 AM is the usual rise time. The patient wakes up spontaneously 8.30, not with an alarm.  She reports not feeling refreshed or restored  in AM, with symptoms such as dry mouth, morning headaches, dull headaches, and residual fatigue:" I could sleep the whole day".   The patient endorsed the Epworth Sleepiness Scale at 17/24 points.   The patient's weight 293 pounds with a height of 64 (inches), resulting in a BMI of 50.1 kg/m2. The patient's neck circumference measured 20 inches.   IN 2020 , she has had 166 respiratory events:  3 obstructive apneas, 1 central apnea and 0 mixed apneas with a total of 4 apneas and an apnea index (AI) of .5 /hour. There were 162 hypopneas with a hypopnea  index of 21.9 /hour. The patient also had 16 respiratory event related arousals (RERAs).    The total APNEA/HYPOPNEA INDEX (AHI) was 22.4/hour and the total RESPIRATORY DISTURBANCE INDEX was 24.4 /hour.   16 events occurred in REM sleep and 293 events in NREM. The REM AHI was 26.7 /hour, versus a non-REM AHI of 22/h. The patient spent 314 minutes of total sleep time in the supine position and 131 minutes in non-supine. The supine AHI was 18.4 versus a non-supine AHI of 32.2.    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  The patient currently drives 5-10 PM . Tobacco use; second hand-.   ETOH use ; cut down a lot- 1/ week,  Caffeine intake in form of Coffee( /) Soda( /) Tea ( 1 a day glass) or energy drinks.  Sleep habits are as follows: The patient's dinner time is variable  PM. The patient goes to bed at 11 PM and  is asleep at 1 AM , then continues to sleep for 7 hours, wakes for 1 bathroom break, the first time at 7 AM.   The preferred sleep position is laterally, with the support of 2 pillows.  Dreams are reportedly rare.  The patient wakes up at 10.00 with an alarm. 10.30  AM is the usual rise time. She reports not feeling refreshed or restored in AM, with symptoms such as  morning headaches and residual fatigue.  Naps are taken infrequently,  she can't sleep in daytime.    Review of Systems: Out of a complete 14 system review, the patient complains of only the following symptoms, and all other reviewed systems are negative.:  Fatigue, sleepiness , snoring, unfragmented sleep, Insomnia,    How likely are you to doze in the following situations: 0 = not likely, 1 = slight chance, 2 = moderate chance, 3 = high chance   Sitting and Reading? Watching Television? Sitting inactive in a public place (theater or meeting)? As a passenger in a car  for an hour without a break? Lying down in the afternoon when circumstances permit? Sitting and talking to someone? Sitting quietly after lunch without alcohol? In a car, while stopped for a few minutes in traffic?   Total = 10/ 24 points   FSS endorsed at 26/ 63 points.   Social History   Socioeconomic History   Marital status: Single    Spouse name: n/a   Number of children: 0   Years of education: Not on file   Highest education level: Not on file  Occupational History   Occupation: Delivery driver for [employer]  Tobacco Use   Smoking status: Never   Smokeless tobacco: Never  Vaping Use  Vaping status: Never Used  Substance and Sexual Activity   Alcohol use: Not Currently    Alcohol/week: 0.0 standard drinks of alcohol    Comment: only on special occasions    Drug use: No   Sexual activity: Yes    Partners: Male    Birth control/protection: Injection    Comment: 03/29/17  Other Topics Concern   Not on file  Social History Narrative   Lives with her mother (adopted) and significant other.  Her sister lives in [place], [place] with her nephew Coletta Memos, born 12/31/2014.   Graduated from [employer].   Social Determinants of Health   Financial Resource Strain: Low Risk  (05/25/2023)   [facility]   Overall Financial Resource Strain (CARDIA)    Difficulty of Paying Living Expenses: Not hard at all  Food Insecurity: Food Insecurity Present (05/25/2023)   Received from [facility]   Hunger Vital Sign    Worried About Running Out of Food in the Last Year: Sometimes true    Ran Out of Food in the Last Year: Sometimes true  Transportation Needs: No Transportation Needs (05/25/2023)   Received from [facility] - Transportation    Lack of Transportation (Medical): No    Lack of Transportation (Non-Medical): No  Physical Activity: Sufficiently Active (05/25/2023)   Received from [facility]   Exercise Vital Sign    Days of Exercise  per Week: 5 days    Minutes of Exercise per Session: 40 min  Stress: Stress Concern Present (05/25/2023)   Received from [facility] - Occupational Stress Questionnaire    Feeling of Stress : To some extent  Social Connections: Moderately Integrated (05/25/2023)   Received from [facility]   Social Network    How would you rate your social network (family, work, friends)?: Adequate participation with social networks    Family History  Adopted: Yes  Problem Relation Age of Onset   Depression Sister        reportedly in and out of treatment facilities, also dx w/ RAD   Suicidality Sister    Obesity Mother     Past Medical History:  Diagnosis Date   ADHD (attention deficit hyperactivity disorder)    Allergy    Anxiety    Asthma    Constipation    Depression    Dysmenorrhea    Cramps periodically.    Dysthymic disorder    History of chlamydia 2016   Migraine headache    Oppositional defiant disorder    Pre-diabetes    Shortness of breath    Shoulder dislocation    bilateral   Swelling of lower extremity    Vitamin D deficiency    Wrist fracture, bilateral     Past Surgical History:  Procedure Laterality Date   WISDOM TOOTH EXTRACTION  04/2013     Current Outpatient Medications on File Prior to Visit  Medication Sig Dispense Refill   ibuprofen (ADVIL) 800 MG tablet Take 800 mg by mouth every 6 (six) hours as needed.     metFORMIN (GLUCOPHAGE) 500 MG tablet Take 500 mg by mouth daily with breakfast.     PARoxetine (PAXIL) 40 MG tablet Take 40 mg by mouth every morning.     zonisamide (ZONEGRAN) 100 MG capsule Take 100 mg by mouth daily.     albuterol (PROVENTIL HFA;VENTOLIN HFA) 108 (90 Base) MCG/ACT inhaler Inhale 1-2 puffs into the lungs every 4 (four)  hours as needed for wheezing or shortness of breath. 1 Inhaler 0   cetirizine (ZYRTEC) 10 MG tablet Take 10 mg by mouth daily as needed for allergies.     hydrOXYzine  (ATARAX/VISTARIL) 25 MG tablet Take 1 tablet (25 mg total) by mouth at bedtime as needed (sleep). 90 tablet 0   lisdexamfetamine (VYVANSE) 60 MG capsule Take 1 capsule (60 mg total) by mouth every morning. 30 capsule 0   lisdexamfetamine (VYVANSE) 60 MG capsule Take 1 capsule (60 mg total) by mouth every morning. 30 capsule 0   lisdexamfetamine (VYVANSE) 60 MG capsule Take 1 capsule (60 mg total) by mouth every morning. 30 capsule 0   medroxyPROGESTERone (DEPO-PROVERA) 400 MG/ML SUSP injection Inject into the muscle once.     metFORMIN (GLUCOPHAGE) 500 MG tablet Take 1 tablet (500 mg total) by mouth daily with breakfast. 30 tablet 0   PARoxetine (PAXIL) 40 MG tablet Take 1 tablet (40 mg total) by mouth daily. 90 tablet 0   No current facility-administered medications on file prior to visit.    Allergies  Allergen Reactions   Camphor Rash    Rash with topical product Rash with topical product     DIAGNOSTIC DATA (LABS, IMAGING, TESTING) - I reviewed patient records, labs, notes, testing and imaging myself where available.  Lab Results  Component Value Date   WBC 12.3 (H) 11/02/2017   HGB 14.3 11/02/2017   HCT 42.3 11/02/2017   MCV 84 11/02/2017   PLT 441 (H) 11/02/2017      Component Value Date/Time   NA 141 05/30/2019 1555   K 4.1 05/30/2019 1555   CL 104 05/30/2019 1555   CO2 20 05/30/2019 1555   GLUCOSE 65 05/30/2019 1555   GLUCOSE 79 04/28/2016 1643   BUN 9 05/30/2019 1555   CREATININE 0.74 05/30/2019 1555   CREATININE 0.72 04/28/2016 1643   CALCIUM 9.4 05/30/2019 1555   PROT 6.8 05/30/2019 1555   ALBUMIN 4.3 05/30/2019 1555   AST 20 05/30/2019 1555   ALT 25 05/30/2019 1555   ALKPHOS 88 05/30/2019 1555   BILITOT 0.4 05/30/2019 1555   GFRNONAA 114 05/30/2019 1555   GFRNONAA >89 02/12/2014 1003   GFRAA 131 05/30/2019 1555   GFRAA >89 02/12/2014 1003   Lab Results  Component Value Date   CHOL 209 (H) 04/28/2016   HDL 51 04/28/2016   LDLCALC 95 04/28/2016    TRIG 315 (H) 04/28/2016   CHOLHDL 4.1 04/28/2016   Lab Results  Component Value Date   HGBA1C 5.2 02/12/2014   Lab Results  Component Value Date   VITAMINB12 349 05/30/2019   Lab Results  Component Value Date   TSH 2.070 11/02/2017    PHYSICAL EXAM:  Today's Vitals   08/17/23 1010  BP: (!) 147/101  Pulse: 74  Weight: 277 lb (125.6 kg)  Height: 5\' 4"  (1.626 m)   Body mass index is 47.55 kg/m.   Wt Readings from Last 3 Encounters:  08/17/23 277 lb (125.6 kg)  07/05/19 288 lb (130.6 kg)  06/13/19 291 lb (132 kg)     Ht Readings from Last 3 Encounters:  08/17/23 5\' 4"  (1.626 m)  07/05/19 5\' 4"  (1.626 m)  06/13/19 5\' 4"  (1.626 m)      General: The patient is awake, alert and appears not in acute distress. The patient is poorly groomed. Head: Normocephalic, atraumatic. Neck is supple. Mallampati 3,  neck circumference:18 inches . Nasal airflow  patent.  Retrognathia is not seen. Small  oral opening.  Dental status: biological  Cardiovascular:  Regular rate and cardiac rhythm by pulse,  without distended neck veins. Respiratory: Lungs are clear to auscultation.  Skin:  With evidence of ankle edema, no  rash. Trunk: The patient's posture is relaxed    NEUROLOGIC EXAM: The patient is awake and alert, oriented to place and time.   Memory subjective described as intact.  Attention span & concentration ability appears normal.  Speech is fluent,  without dysarthria, dysphonia or aphasia.  Mood and affect are appropriate.   Cranial nerves: no loss of smell or taste reported  Pupils are equal and briskly reactive to light. Funduscopic exam deferred.  Extraocular movements in vertical and horizontal planes were intact and without nystagmus. Reports having Diplopia in the dark. Visual fields by finger perimetry are intact. Hearing was intact to soft voice and finger rubbing.    Facial sensation intact to fine touch.  Facial motor strength is symmetric and tongue midline.   Neck ROM : rotation, tilt and flexion extension were normal for age and shoulder shrug was symmetrical.    Motor exam:  Symmetric bulk, tone and ROM.   Normal tone without cog wheeling, symmetric grip strength .   Sensory:  Fine touch and vibration were normal.  Proprioception tested in the upper extremities was normal.   Coordination: Rapid alternating movements in the fingers/hands were of normal speed.  The Finger-to-nose maneuver was intact without evidence of ataxia, dysmetria or tremor.   Deep tendon reflexes: in the upper and lower extremities are symmetric and intact.  Patella DTR is attenuated.     ASSESSMENT AND PLAN 27 year old female patient of [facility] primary care here with:  Previously diagnosed obesity hypoventilation syndrome.  A sleep study performed in lab on 8-9 2020 revealed moderate obstructive sleep hypopnea there were very few apneas noticed at the time the Epworth sleepiness score was 12 out of 24 and today 10 out of 24.  The patient reports that she does snore.  She does have a history of headaches, she was fairly recently diagnosed with type 2 diabetes she remains class III obese, her BMI was over 50.  She has a history of ADHD, migraine, and recently more elevated blood pressure readings.    Based on the elevated blood pressure and hyperglycemia it was physician assistants Lin Givens desired to have the patient rechecked for apnea as no treatment followed after her initial sleep study.  In that time the patient had moved to [place] had transportation difficulties it was also in the pandemic year when CPAP machines were on back order.    1) Untreated OSA-  further weight gain, and new dx of HTN and DM2- we need to retest, check for a new AHI baseline. This time I will ask for an in lab sleep study, SPLIT at AHI 10/h and the patient will need to take a sleep aid for that night- her current sleep routine does speak of delayed sleep onset and we wouldn't be able to   implement a SPLIT night  protocol if she hadn't slept 2 hours before midnight.   I will order SPLIT and Xanax 0.5 mg for the night in the sleep lab.   Weight loss is still main goal for OSA and hypoventilation treatment.     I plan to follow up either personally or through our NP within 4-5 months.   I would like to thank Porfirio Oar, Pa [address] [place],  [place]  [address] for allowing me to meet with and to take care of this pleasant patient.    After spending a total time of  45  minutes face to face and additional time for physical and neurologic examination, review of laboratory studies,  personal review of imaging studies, reports and results of other testing and review of referral information / records as far as provided in visit,   Electronically signed by: Melvyn Novas, MD 08/17/2023 10:21 AM  [facility] Board certified by The [employer] of Sleep Medicine and Diplomate of the [employer] of Sleep Medicine. Board certified In Neurology through the ABPN, Fellow of the [employer] of Neurology.

## 2023-08-17 NOTE — Patient Instructions (Signed)
Quality Sleep Information, Adult Quality sleep is important for your mental and physical health. It also improves your quality of life. Quality sleep means you: Are asleep for most of the time you are in bed. Fall asleep within 30 minutes. Wake up no more than once a night. Are awake for no longer than 20 minutes if you do wake up during the night. Most adults need 7-8 hours of quality sleep each night. How can poor sleep affect me? If you do not get enough quality sleep, you may have: Mood swings. Daytime sleepiness. Decreased alertness, reaction time, and concentration. Sleep disorders, such as insomnia and sleep apnea. Difficulty with: Solving problems. Coping with stress. Paying attention. These issues may affect your performance and productivity at work, school, and home. Lack of sleep may also put you at higher risk for accidents, suicide, and risky behaviors. If you do not get quality sleep, you may also be at higher risk for several health problems, including: Infections. Type 2 diabetes. Heart disease. High blood pressure. Obesity. Worsening of long-term conditions, like arthritis, kidney disease, depression, Parkinson's disease, and epilepsy. What actions can I take to get more quality sleep? Sleep schedule and routine Stick to a sleep schedule. Go to sleep and wake up at about the same time each day. Do not try to sleep less on weekdays and make up for lost sleep on weekends. This does not work. Limit naps during the day to 30 minutes or less. Do not take naps in the late afternoon. Make time to relax before bed. Reading, listening to music, or taking a hot bath promotes quality sleep. Make your bedroom a place that promotes quality sleep. Keep your bedroom dark, quiet, and at a comfortable room temperature. Make sure your bed is comfortable. Avoid using electronic devices that give off bright blue light for 30 minutes before bedtime. Your brain perceives bright blue light  as sunlight. This includes television, phones, and computers. If you are lying awake in bed for longer than 20 minutes, get up and do a relaxing activity until you feel sleepy. Lifestyle     Try to get at least 30 minutes of exercise on most days. Do not exercise 2-3 hours before going to bed. Do not use any products that contain nicotine or tobacco. These products include cigarettes, chewing tobacco, and vaping devices, such as e-cigarettes. If you need help quitting, ask your health care provider. Do not drink caffeinated beverages for at least 8 hours before going to bed. Coffee, tea, and some sodas contain caffeine. Do not drink alcohol or eat large meals close to bedtime. Try to get at least 30 minutes of sunlight every day. Morning sunlight is best. Medical concerns Work with your health care provider to treat medical conditions that may affect sleeping, such as: Nasal obstruction. Snoring. Sleep apnea and other sleep disorders. Talk to your health care provider if you think any of your prescription medicines may cause you to have difficulty falling or staying asleep. If you have sleep problems, talk with a sleep consultant. If you think you have a sleep disorder, talk with your health care provider about getting evaluated by a specialist. Where to find more information Sleep Foundation: sleepfoundation.org American Academy of Sleep Medicine: aasm.org Centers for Disease Control and Prevention (CDC): TonerPromos.no Contact a health care provider if: You have trouble getting to sleep or staying asleep. You often wake up very early in the morning and cannot get back to sleep. You have daytime sleepiness. You  have daytime sleep attacks of suddenly falling asleep and sudden muscle weakness (narcolepsy). You have a tingling sensation in your legs with a strong urge to move your legs (restless legs syndrome). You stop breathing briefly during sleep (sleep apnea). You think you have a sleep  disorder or are taking a medicine that is affecting your quality of sleep. Summary Most adults need 7-8 hours of quality sleep each night. Getting enough quality sleep is important for your mental and physical health. Make your bedroom a place that promotes quality sleep, and avoid things that may cause you to have poor sleep, such as alcohol, caffeine, smoking, or large meals. Talk to your health care provider if you have trouble falling asleep or staying asleep. This information is not intended to replace advice given to you by your health care provider. Make sure you discuss any questions you have with your health care provider. Document Revised: 03/10/2022 Document Reviewed: 03/10/2022 Elsevier Patient Education  2024 [employer].

## 2023-08-24 ENCOUNTER — Telehealth: Payer: Self-pay | Admitting: Neurology

## 2023-08-24 NOTE — Telephone Encounter (Signed)
Split- MCD [place] blue pending uploaded notes.

## 2023-08-29 NOTE — Telephone Encounter (Signed)
Split MCD Healthy Wendy Levy: [id number] (exp.08/24/23 to 10/22/23)

## 2023-09-20 NOTE — Telephone Encounter (Signed)
Patient is scheduled at [facility] for 11/07/23 at 9 pm.  Mailed packet to the patient.   I will also redo the authorization because it will expire before she has the study.

## 2023-10-17 NOTE — Telephone Encounter (Signed)
I faxed a request for a extension on the authorization.

## 2023-10-18 NOTE — Telephone Encounter (Signed)
Updated insurance auth: Split- MCD Healthy blue Berkley Harvey: [id number] (exp. 08/24/23 to 11/29/23)   Patient is scheduled at [facility] for 11/07/23 at 9 pm.

## 2023-11-07 ENCOUNTER — Ambulatory Visit (INDEPENDENT_AMBULATORY_CARE_PROVIDER_SITE_OTHER): Payer: Medicaid Other | Admitting: Neurology

## 2023-11-07 DIAGNOSIS — I158 Other secondary hypertension: Secondary | ICD-10-CM

## 2023-11-07 DIAGNOSIS — G4733 Obstructive sleep apnea (adult) (pediatric): Secondary | ICD-10-CM

## 2023-11-07 DIAGNOSIS — E662 Morbid (severe) obesity with alveolar hypoventilation: Secondary | ICD-10-CM

## 2023-11-07 DIAGNOSIS — R0683 Snoring: Secondary | ICD-10-CM

## 2023-11-07 DIAGNOSIS — R519 Headache, unspecified: Secondary | ICD-10-CM

## 2023-11-13 NOTE — Addendum Note (Signed)
Addended by: Melvyn Novas on: 11/13/2023 04:36 PM   Modules accepted: Orders

## 2023-11-13 NOTE — Procedures (Signed)
[facility] SPLIT NIGHT INTERPRETATION REPORT   STUDY DATE: 11/07/2023     PATIENT NAME:  Wendy Levy, Wendy Levy         DATE OF BIRTH:  02-16-1996  PATIENT ID:  [id number]    TYPE OF STUDY:  SPLIT  READING PHYSICIAN: Melvyn Novas, MD Porfirio Oar, [place] [facility]  SCORING TECHNICIAN: Zenon Mayo Fields   INDICATIONS: 08-17-2023:  This 27 year-old Female had a sleep study in 2020  with an AHI of 22.4/h and remained untreated for OSA- Untreated OSA-  further weight gain, and in this 4 year interval received new dx of HTN and DM2- and is followed for mental health.  We need to retest, check for a new AHI baseline. This time I will ask for an in- lab sleep study, SPLIT at AHI 10/h and the patient will need to take a sleep aid for that night- her current sleep routine does speak of delayed sleep onset and we wouldn't be able to implement a SPLIT night  protocol if she hadn't slept 2 hours before midnight. I will order SPLIT and Xanax 0.5 mg for the night in the sleep lab.  .The Epworth Sleepiness Scale was endorsed at 10 out of 24 (scores above or equal to 10 are suggestive of hypersomnolence). In 2020 she endorsed 17 poits. FSS at 26/ 63 points.   DESCRIPTION: A sleep technologist was in attendance for the duration of the recording.  Data collection, scoring, video monitoring, and reporting were performed in compliance with the AASM Manual for the Scoring of Sleep and Associated Events; (Hypopnea is scored based on the criteria listed in Section VIII D. 1b in the AASM Manual V2.6 using a 4% oxygen desaturation rule or Hypopnea is scored based on the criteria listed in Section VIII D. 1a in the AASM Manual V2.6 using 3% oxygen desaturation and /or arousal rule).  A physician certified by the American Board of Sleep Medicine reviewed each epoch of the study.  ADDITIONAL INFORMATION:  Height: 64.0 in Weight: 277 lb (BMI 47.6) Neck Size: 18.0" Medications: Ibuprofen, Glucophage,  Paxil, Zonegran, Proventil, Zyrtec, Hydroxyzine, Vyvanse, Depo-Provera  STUDY DETAILS: Lights off was at 22:03: and lights on 05:00: (417 minutes hours in bed). This study was performed with an initial diagnostic portion followed by positive airway pressure titration.  DIAGNOSTIC PART 1    SLEEP CONTINUITY AND SLEEP ARCHITECTURE:  The diagnostic portion of the study began at 22:03 and ended at 01:18, for a recording time of 3 h 15.54m minutes.  Total sleep time was 181 minutes with a normal sleep efficiency at 92.8%.  Sleep latency was decreased at 1.0 minutes.  REM sleep latency was 75.5 minutes.   Of the total sleep time, the percentage of stage N1 sleep was 1.1%, stage N2 sleep was 59.5%, stage N3 sleep was 36.9%, and REM sleep was 2.5%. There was only 1 Stage R period observed during this portion of the study, 3 awakenings (i.e. transitions to Stage W from any sleep stage), and 18.0 total stage transitions.  Wake after sleep onset (WASO) time accounted for 13 minutes.  BODY POSITION: Duration of total sleep and percent of total sleep in their respective position is as follows: supine 81 minutes (44.6%), non-supine 100.5 minutes (55.4%); Divided into:  right sided sleep  for 00 minutes (0.0%), left for 100 minutes (55.4%), and prone 00 minutes (0.0%). Total supine REM sleep time was only 04 minutes (100.0% of total REM sleep).    AROUSAL (Baseline):  There were 46.0 arousals in total, for an arousal index of 15.2 arousals/hour.  Of these, 18.0 were identified as respiratory-related arousals (6.0 /hr), 0 were PLM-related arousals (0.0 /hr), and 28 were non-specific arousals (9.3 /hr)   RESPIRATORY MONITORING:    Based on AASM criteria (using a 3% oxygen desaturation and /or arousal rule for scoring hypopneas), there were 3 apneas (0 obstructive; 3 central; 0 mixed), and 68 hypopneas.   The Apnea index was 0.0.  The Hypopnea index was 15.2/h.  The apnea-hypopnea index was 15.2 overall . All  NREM AHI, supine 11.85/h. non supine 18/h.  0.0 REM, 0.0 supine REM). There were 0 respiratory effort-related arousals (RERAs).   Respiratory events were associated with oxyhemoglobin desaturations (nadir 86%) from a normal baseline (mean 97%). Total time spent at, or below 88% was 3.9 minutes, or 2.2%  of total sleep time. Snoring volume was classified as moderate to loud . There were 0.0 occurrences of Cheyne Stokes breathing.   LIMB MOVEMENTS: There were 0 periodic limb movements of sleep (0.0/hr), of which 0 (0.0/hr) were associated with an arousal.   OXIMETRY: Total sleep time spent at, or below 88% was 0.4 minutes, or 0.2% of total sleep time.     EKG : The highest heart rate for the baseline portion of the study was 94.0 beats per minute.  The average heart rate during sleep was 80 bpm, while the highest heart rate for the same period was 94 bpm.    TREATMENT  PART 2: ResMed F 40 FFM in size small and wide was fitted for this patient-  and was switched in the last 2 hours for a Evora FFM  size XS. CPAP titration started at 5 cm water pressure with humidification. Finaly explored pressure was 10 cm water with 1 cm EPR, with an AHI of 2.35/h.    SLEEP CONTINUITY AND SLEEP ARCHITECTURE:  The treatment portion of the study began at 01:18 and ended at 05:00, for a recording time of 3h 41.68m minutes.  Total sleep time was 209 minutes with a normal sleep efficiency at 94.4%.  Sleep latency was decreased at 0.0 minutes.  REM sleep latency was decreased at 3.0 minutes.  There was still N3 delta sleep seen during this second half of the night, a common finding in young patients.    Of the total sleep time, the percentage of stage N1 sleep was 0.2%, stage N3 sleep was 18.7%, and REM sleep was 21.1%. There were 2 Stage R periods observed during this portion of the study, 8 awakenings (i.e. transitions to Stage W from any sleep stage), and 25.0 total stage transitions.  Wake after sleep onset (WASO)  time accounted for 12 minutes.  BODY POSITION: Duration of total sleep and percent of total sleep in their respective position is as follows: supine 126 minutes (60.3%), non-supine 83.0 minutes (39.7%); right 83 minutes (39.7%), left 00 minutes (0.0%), and prone 00 minutes (0.0%). Total supine REM sleep time was 25 minutes (56.8% of total REM sleep).   RESPIRATORY MONITORING:    While on PAP therapy, based on CMS criteria, the apnea-hypopnea index was 7.2 overall (6.0 supine; 6.3 REM). All events were hypopneas.    While on PAP therapy, based on AASM criteria, the apnea-hypopnea index was 7.2/h overall (6.0 supine; 6.3 REM).   Moderate and loud intermittent Snoring was controlled after switch to EVORA FFM.  There were 0.0 occurrences of Cheyne Stokes breathing.  LIMB MOVEMENTS: There were 0 periodic limb movements  of sleep (0.0/hr), of which 0 (0.0/hr) were associated with an arousal.   OXIMETRY: Total sleep time spent at, or below 88% was 1.0 minutes, or 0.5% of total sleep time. Respiratory events were associated with oxyhemoglobin desaturation (nadir was 77.0%) from a mean of 97.0%.  Total time spent at, or below 88% was 1.0 minutes, or 0.5% of total sleep time.   AROUSAL: There were 34.0 arousals in total, for an arousal index of 8.6 arousals/hour.  Of these, 8.0 were identified as respiratory-related arousals (2.3 /h), 0 were PLM-related arousals (0.0 /hr), and 26 were non-specific arousals (7.5 /h)      EKG: Analysis of electrocardiogram activity showed the highest heart rate for the treatment portion of the study was 106.0 beats per minute.  The average heart rate during sleep was 78 bpm, while the highest heart rate for the same period was 93 bpm.     IMPRESSION: The diagnostic part of this SPLIT protocol PSG confirmed the dx of OSA, mostly in form of hypopneas, consistent with hypoventilation. There were almost 9 minutes of sleep hypoxia time recorded (<90%), with moderate to loud  snoring . Titration to CPAP followed , beginning at 5 cm water pressure and advancing rapidly to 9 cm water pressure with an AHI of 1.9/h and 10 cm water pressure with an AHI of 2.4/h, and 100% sleep efficiency for 51 minutes of titration time.      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 One central apneas emerged under 10 cm water CPAP therapy. The patient did best with an EVORA FFM in XS.   The patient reported she slept deeper and longer than usual and felt refreshed in AM. She did feel comfortable under FFM and CPAP therapy.   I ordered an autotitration CPAP device with heated humidity , the EVORA FFM in XS, and a pressure setting from  5-12 cm water pressure with 1 cm EPR.   Weight loss is the therapy number one for patient with hypoventilation and REM sleep      Melvyn Novas, MD [facility] Sleep Board certified by The [employer] of Sleep Medicine and Diplomate of the [employer] of Sleep Medicine. Board certified In Neurology through the ABPN, Fellow of the [employer] of Neurology.             [facility] Split Sleep Study Protocol Summary    General Information  Name: Wendy Levy, Wendy Levy BMI: 84.13 Physician: Melvyn Novas, MD  ID: [id number] Height: 64.0 in Technician: Margaretann Loveless, RPSGT  Sex: Female Weight: 277.0 lb Record: [id number]  Age: 25 [10-12-1996] Date: 11/07/2023     Medical & Medication History    Samir Finnell is a 27 y.o. female patient who is this time seen upon referral on 08/17/2023 from [facility], for a new sleep consult. Chief concern according to patient : remaining untreated for sleep apnea, moved to [facility] 2020, had no transportation. I have the pleasure of seeing Mehgan Murgia on 08/17/23. A previously seen female with a  previously diagnosed sleep apnea disorder. She has now Type 2 DM, has HTN, is morbidly obese, is under psychiatric care. HISTORY: Melora Prawdzik is a 27 y.o. year old Caucasian female patient seen face to face upon a referral on 06/12/2019 (Dr. Rinaldo Ratel) for a sleep consultation. Arha Souffrant is a right -handed Caucasian female  with a medical history of ADHD (attention deficit hyperactivity disorder), Allergy, Anxiety, Asthma, Constipation, Depression, Dysmenorrhea, Dysthymic disorder, Migraine headache, Oppositional defiant disorder, Pre-diabetes, Shortness of breath, Shoulder dislocation, Swelling of lower extremity, Vitamin D deficiency, and Wrist fracture, bilateral. She has been treated for morbid obesity. Sleep relevant medical history: Parents and boyfriend have noted her to stop breathing. She snores. She is treated for depression, anxiety and has cyclic sleep problems. IN 2020 , she has had 166 respiratory events: 3 obstructive apneas, 1 central apnea and 0 mixed apneas with a total of 4 apneas and an apnea index (AI) of .5 /hour. There were 162 hypopneas with a hypopnea index of 21.9 /hour. The patient also had 16 respiratory event related arousals (RERAs). The total APNEA/HYPOPNEA INDEX (AHI) was 22.4/hour and the total RESPIRATORY DISTURBANCE INDEX was 24.4 /hour. 16 events occurred in REM sleep and 293 events in NREM. The REM AHI was 26.7 /hour, versus a non-REM AHI of 22/h. The patient spent 314 minutes of total sleep time in the supine position and 131 minutes in non-supine. The supine AHI was 18.4 versus a non-supine AHI of 32.2.  Ibuprofen, Glucophage, Paxil, Zonegran, Proventil, Zyrtec, Hydroxyzine, Vyvanse, Depo-Provera   Sleep Disorder      Comments   The patient came into the lab for a SPLIT. The patient took Xanax prior to start of study. The patient had a prior sleep study on 07/08/19 with our sleep lab. The AHI was 22.4/hr, Rem AHI 26.7/h and NREM AHI 22/h. The patient was split due to  having an AHI greater than 10 after 2 hours of TST per MD's order. The patient was fitted with a ResMed F40 (FFM) size SW. The mask was a good fit to face, and the patient tolerate the mask well. Later in the night the interface started leaking. Switched interface to a F&P Evora (FFM) size XS. CPAP was started at [facility] with no EPR. CPAP was increased to 10cmH2O with EPR of 1. The patient had one restroom break. EKG showed no obvious cardiac arrhythmias. Moderate to loud snoring prior to CPAP placement. All sleep stages witnessed. Respiratory events scored with a 4% desat. Slept lateral and supine. AHI was 8.1 after 2 hrs of TST.    Baseline Sleep Stage Information Baseline start time: 10:03:03 PM Baseline end time: 01:18:38 AM   Time Total Supine Side Prone Upright  Recording 3h 15.73m 1h 24.4m 1h 51.66m 0h 0.44m 0h 0.66m  Sleep 3h 1.74m 1h 21.42m 1h 40.49m 0h 0.70m 0h 0.12m   Latency N1 N2 N3 REM Onset Per. Slp. Eff.  Actual 0h 0.105m 0h 1.41m 0h 8.84m 1h 15.5m 0h 1.55m 0h 1.62m 92.84%   Stg Dur Wake N1 N2 N3 REM  Total 14.0 2.0 108.0 67.0 4.5  Supine 3.5 1.5 42.0 33.0 4.5  Side 10.5 0.5 66.0 34.0 0.0  Prone 0.0 0.0 0.0 0.0 0.0  Upright 0.0 0.0 0.0 0.0 0.0   Stg % Wake N1 N2 N3 REM  Total 7.2 1.1 59.5 36.9 2.5  Supine 1.8 0.8 23.1 18.2 2.5  Side 5.4 0.3 36.4 18.7 0.0  Prone 0.0 0.0 0.0 0.0 0.0  Upright 0.0 0.0 0.0 0.0 0.0    CPAP Sleep Stage Information CPAP start time: 01:18:38 AM CPAP end time: 05:00:09 AM   Time Total Supine Side Prone Upright  Recording (TRT) 3h 41.62m 2h 16.87m 1h 25.54m 0h 0.27m 0h 0.25m  Sleep (TST) 3h 29.47m 2h 6.99m 1h 23.51m 0h 0.43m 0h 0.16m   Latency  N1 N2 N3 REM Onset Per. Slp. Eff.  Actual 2h 17.79m 0h 0.98m 0h 54.12m 0h 3.82m 0h 0.43m 0h 1.37m 94.36%   Stg Dur Wake N1 N2 N3 REM  Total 12.5 0.5 125.5 39.0 44.0  Supine 10.5 0.0 62.0 39.0 25.0  Side 2.0 0.5 63.5 0.0 19.0  Prone 0.0 0.0 0.0 0.0 0.0  Upright 0.0 0.0 0.0 0.0 0.0   Stg % Wake N1 N2 N3 REM  Total 5.6 0.2 60.0 18.7  21.1  Supine 4.7 0.0 29.7 18.7 12.0  Side 0.9 0.2 30.4 0.0 9.1  Prone 0.0 0.0 0.0 0.0 0.0  Upright 0.0 0.0 0.0 0.0 0.0    Baseline Respiratory Information Apnea Summary Sub Supine Side Prone Upright  Total 0 Total 0 0 0 0 0    REM 0 0 0 0 0    NREM 0 0 0 0 0  Obs 0 REM 0 0 0 0 0    NREM 0 0 0 0 0  Mix 0 REM 0 0 0 0 0    NREM 0 0 0 0 0  Cen 0 REM 0 0 0 0 0    NREM 0 0 0 0 0   Rera Summary Sub Supine Side Prone Upright  Total 0 Total 0 0 0 0 0    REM 0 0 0 0 0    NREM 0 0 0 0 0   Hypopnea Summary Sub Supine Side Prone Upright  Total 46 Total 46 16 30 0 0    REM 0 0 0 0 0    NREM 46 16 30 0 0   4% Hypopnea Summary Sub Supine Side Prone Upright  Total (4%) 46 Total 46 16 30 0 0    REM 0 0 0 0 0    NREM 46 16 30 0 0     AHI Total Obs Mix Cen  15.21 Apnea 0.00 0.00 0.00 0.00   Hypopnea 15.21 -- -- --  15.21 Hypopnea (4%) 15.21 -- -- --    Total Supine Side Prone Upright  Position AHI 15.21 11.85 17.91 0.00 0.00  REM AHI 0.00   NREM AHI 15.59   Position RDI 15.21 11.85 17.91 0.00 0.00  REM RDI 0.00   NREM RDI 15.59    4% Hypopnea Total Supine Side Prone Upright  Position AHI (4%) 15.21 11.85 17.91 0.00 0.00  REM AHI (4%) 0.00   NREM AHI (4%) 15.59   Position RDI (4%) 15.21 11.85 17.91 0.00 0.00  REM RDI (4%) 0.00   NREM RDI (4%) 15.59    CPAP Respiratory Information Apnea Summary Sub Supine Side Prone Upright  Total 3 Total 3 2 1  0 0    REM 3 2 1  0 0    NREM 0 0 0 0 0  Obs 0 REM 0 0 0 0 0    NREM 0 0 0 0 0  Mix 0 REM 0 0 0 0 0    NREM 0 0 0 0 0  Cen 3 REM 3 2 1  0 0    NREM 0 0 0 0 0   Rera Summary Sub Supine Side Prone Upright  Total 0 Total 0 0 0 0 0    REM 0 0 0 0 0    NREM 0 0 0 0 0   Hypopnea Summary Sub Supine Side Prone Upright  Total 22 Total 22 19 3  0 0    REM 19 16 3  0 0  NREM 3 3 0 0 0   4% Hypopnea Summary Sub Supine Side Prone Upright  Total (4%) 22 Total 22 19 3  0 0    REM 19 16 3  0 0    NREM 3 3 0 0 0     AHI Total Obs Mix Cen   7.18 Apnea 0.86 0.00 0.00 0.86   Hypopnea 6.32 -- -- --  7.18 Hypopnea (4%) 6.32 -- -- --    Total Supine Side Prone Upright  Position AHI 7.18 10.00 2.89 0.00 0.00  REM AHI 30.00   NREM AHI 1.09   Position RDI 7.18 10.00 2.89 0.00 0.00  REM RDI 30.00   NREM RDI 1.09    4% Hypopnea Total Supine Side Prone Upright  Position AHI (4%) 7.18 10.00 2.89 0.00 0.00  REM AHI (4%) 30.00   NREM AHI (4%) 1.09   Position RDI (4%) 7.18 10.00 2.89 0.00 0.00  REM RDI (4%) 30.00   NREM RDI (4%) 1.09    Desaturation Information (Baseline)  <100% <90% <80% <70% <60% <50% <40%  Supine 27 3 0 0 0 0 0  Side 53 10 0 0 0 0 0  Prone 0 0 0 0 0 0 0  Upright 0 0 0 0 0 0 0  Total 80 13 0 0 0 0 0  Desaturation threshold setting: 3% Minimum desaturation setting: 10 seconds SaO2 nadir: 86% The longest event was a 62 sec obstructive Hypopneawith a minimum SaO2 of 91%. The lowest SaO2 was 86% associated with a 41 sec obstructive Hypopnea. Awakening/Arousal Information (Baseline) # of Awakenings 3  Wake after sleep onset 13.34m  Wake after persistent sleep 13.63m   Arousal Assoc. Arousals Index  Apneas 0 0.0  Hypopneas 18 6.0  Leg Movements 0 0.0  Snore 0.0 0.0  PTT Arousals 0 0.0  Spontaneous 28 9.3  Total 46 15.2   Desaturation Information (CPAP)  <100% <90% <80% <70% <60% <50% <40%  Supine 29 6 1  0 0 0 0  Side 11 2 1  0 0 0 0  Prone 0 0 0 0 0 0 0  Upright 0 0 0 0 0 0 0  Total 40 8 2 0 0 0 0  Desaturation threshold setting: 3% Minimum desaturation setting: 10 seconds SaO2 nadir: 77% The longest event was a 32 sec obstructive Hypopnea with a minimum SaO2 of 91%. The lowest SaO2 was 77% associated with a 14 sec central Apnea. Awakening/Arousal Information (CPAP) # of Awakenings 8  Wake after sleep onset 12.25m  Wake after persistent sleep 12.25m   Arousal Assoc. Arousals Index  Apneas 2 0.6  Hypopneas 6 1.7  Leg Movements 0 0.0  Snore 0.0 0.0  PTT Arousals 0 0.0  Spontaneous 26 7.5   Total 34 9.8     EKG Rates (Baseline) EKG Avg Max Min  Awake 83 94 69  Asleep 80 94 71  EKG Events: Tachycardia Myoclonus Information (Baseline) PLMS LMs Index  Total LMs during PLMS 0 0.0  LMs w/ Microarousals 0 0.0   LM LMs Index  w/ Microarousal 0 0.0  w/ Awakening 0 0.0  w/ Resp Event 0 0.0  Spontaneous 0 0.0  Total 0 0.0   EKG Rates (CPAP) EKG Avg Max Min  Awake 87 106 73  Asleep 78 93 66  EKG Events: Tachycardia Myoclonus Information (CPAP) PLMS LMs Index  Total LMs during PLMS 0 0.0  LMs w/ Microarousals 0 0.0   LM LMs Index  w/ Microarousal  0 0.0  w/ Awakening 0 0.0  w/ Resp Event 0 0.0  Spontaneous 0 0.0  Total 0 0.0     [facility] CPAP/Bilevel Report    General Information  Name: Wendy Levy, Wendy Levy BMI: 62 Physician: ,   ID: [id number] Height: 64 in Technician: Margaretann Loveless  Sex: Female Weight: 277 lb Record: [id number]  Age: 68 [October 22, 1996] Date: 11/07/2023 Scorer: Zenon Mayo Fields   Recommended Settings IPAP: N/A cmH20 EPAP: N/A cmH2O AHI: N/A AHI (4%): N/A   Pressure IPAP/EPAP 00 05 07 08 09 10   O2 Vol 0.0 0.0 0.0 0.0 0.0 0.0  Time TRT 194.64m 9.17m 12.60m 14.57m 136.83m 51.37m   TST 181.74m 8.75m 12.102m 14.46m 124.61m 51.67m  Sleep Stage % Wake 6.5 10.5 0.0 0.0 8.8 0.0   % REM 2.5 64.7 100.0 53.6 7.3 19.6   % N1 0.8 5.9 0.0 0.0 0.4 0.0   % N2 59.7 29.4 0.0 46.4 60.9 80.4   % N3 37.0 0.0 0.0 0.0 31.5 0.0  Respiratory Total Events 46 6 10 3 4 2    Obs. Apn. 0 0 0 0 0 0   Mixed Apn. 0 0 0 0 0 0   Cen. Apn. 0 2 0 0 0 1   Hypopneas 46 4 10 3 4 1    AHI 15.25 42.35 50.00 12.86 1.94 2.35   Supine AHI 11.93 42.35 50.00 12.86 1.30 0.00   Prone AHI 0.00 0.00 0.00 0.00 0.00 0.00   Side AHI 17.91 0.00 0.00 0.00 3.75 2.35  Respiratory (4%) Hypopneas (4%) 46.00 4.00 10.00 3.00 4.00 1.00   AHI (4%) 15.25 42.35 50.00 12.86 1.94 2.35   Supine AHI (4%) 11.93 42.35 50.00 12.86 1.30 0.00   Prone AHI (4%) 0.00 0.00 0.00 0.00 0.00  0.00   Side AHI (4%) 17.91 0.00 0.00 0.00 3.75 2.35  Desat Profile <= 90% 8.41m 0.11m 0.13m 0.64m 0.93m 0.39m   <= 80% 3.77m 0.79m 0.85m 0.25m 0.39m 0.14m   <= 70% 3.69m 0.86m 0.8m 0.104m 0.4m 0.87m   <= 60% 3.39m 0.69m 0.77m 0.71m 0.69m 0.23m  Arousal Index Apnea 0.0 14.1 0.0 0.0 0.0 0.0   Hypopnea 6.0 21.2 5.0 0.0 1.0 0.0   LM 0.0 0.0 0.0 0.0 0.0 0.0   Spontaneous 9.3 21.2 5.0 0.0 10.2 1.2  [facility] SPLIT NIGHT INTERPRETATION REPORT  [facility] CPAP/Bilevel Report    General Information  Name: Wendy Levy, Wendy Levy BMI: 47 Physician: ,   ID: [id number] Height: 64 in Technician: Margaretann Loveless  Sex: Female Weight: 277 lb Record: [id number]  Age: 80 [02-16-1996] Date: 11/07/2023 Scorer: Zenon Mayo Fields   Recommended Settings IPAP: N/A cmH20 EPAP: N/A cmH2O AHI: N/A AHI (4%): N/A   Pressure IPAP/EPAP 00 05 07 08 09 10   O2 Vol 0.0 0.0 0.0 0.0 0.0 0.0  Time TRT 194.56m 9.53m 12.94m 14.46m 136.43m 51.38m   TST 181.56m 8.55m 12.19m 14.40m 124.39m 51.18m  Sleep Stage % Wake 6.5 10.5 0.0 0.0 8.8 0.0   % REM 2.5 64.7 100.0 53.6 7.3 19.6   % N1 0.8 5.9 0.0 0.0 0.4 0.0   % N2 59.7 29.4 0.0 46.4 60.9 80.4   % N3 37.0 0.0 0.0 0.0 31.5 0.0  Respiratory Total Events 46 6 10 3 4 2    Obs. Apn. 0 0 0 0 0 0   Mixed Apn. 0 0 0 0 0 0   Cen. Apn. 0 2 0 0  0 1   Hypopneas 46 4 10 3 4 1    AHI 15.25 42.35 50.00 12.86 1.94 2.35   Supine AHI 11.93 42.35 50.00 12.86 1.30 0.00   Prone AHI 0.00 0.00 0.00 0.00 0.00 0.00   Side AHI 17.91 0.00 0.00 0.00 3.75 2.35  Respiratory (4%) Hypopneas (4%) 46.00 4.00 10.00 3.00 4.00 1.00   AHI (4%) 15.25 42.35 50.00 12.86 1.94 2.35   Supine AHI (4%) 11.93 42.35 50.00 12.86 1.30 0.00   Prone AHI (4%) 0.00 0.00 0.00 0.00 0.00 0.00   Side AHI (4%) 17.91 0.00 0.00 0.00 3.75 2.35  Desat Profile <= 90% 8.93m 0.59m 0.64m 0.55m 0.81m 0.55m   <= 80% 3.3m 0.58m 0.109m 0.58m 0.24m 0.59m   <= 70% 3.49m 0.66m 0.48m 0.48m 0.81m 0.77m   <= 60% 3.64m  0.67m 0.47m 0.49m 0.63m 0.57m  Arousal Index Apnea 0.0 14.1 0.0 0.0 0.0 0.0   Hypopnea 6.0 21.2 5.0 0.0 1.0 0.0   LM 0.0 0.0 0.0 0.0 0.0 0.0   Spontaneous 9.3 21.2 5.0 0.0 10.2 1.2   STUDY DATE: 11/07/2023

## 2023-11-16 ENCOUNTER — Telehealth: Payer: Self-pay | Admitting: Neurology

## 2023-11-16 NOTE — Telephone Encounter (Signed)
-----   Message from Garretson Dohmeier sent at 11/13/2023  4:36 PM EST ----- The diagnostic part of this SPLIT protocol PSG confirmed the dx of OSA, mostly in form of hypopneas, consistent with hypoventilation. There were almost 9 minutes of sleep hypoxia time recorded (<90%), with moderate to loud snoring . All controlled at CPAP pressures of 9 and 10 cm water with 1 cm EPR. The patient did best with an EVORA FFM in XS.  under 10 cm water CPAP therapy.  I ordered an autotitration CPAP device with heated humidity , the EVORA FFM in XS, and a pressure setting from  5-12 cm water pressure with 1 cm EPR.   Weight loss remains the main long term treatment for OSA with hypoventilation and REM sleep dominance.

## 2023-11-16 NOTE — Telephone Encounter (Signed)
I called pt. I advised pt that Dr. Vickey Huger reviewed their sleep study results and found that pt has sleep apnea. Dr. Vickey Huger recommends that pt starts auto cPAP. I reviewed PAP compliance expectations with the pt. Pt is agreeable to starting a CPAP. I advised pt that an order will be sent to a DME, [facility], and [facility] will call the pt within about one week after they file with the pt's insurance. [facility] will show the pt how to use the machine, fit for masks, and troubleshoot the CPAP if needed. A follow up appt will need to be made for insurance purposes with Dr. Vickey Huger within 31-90 days. Pt verbalized understanding to arrive 15 minutes early and bring their CPAP. Pt verbalized understanding of results. Pt had no questions at this time but was encouraged to call back if questions arise. I have sent the order to [facility] and have received confirmation that they have received the order.

## 2024-01-18 ENCOUNTER — Ambulatory Visit: Payer: Medicaid Other | Admitting: Neurology

## 2024-04-02 ENCOUNTER — Encounter: Payer: Self-pay | Admitting: Neurology

## 2024-04-02 ENCOUNTER — Ambulatory Visit (INDEPENDENT_AMBULATORY_CARE_PROVIDER_SITE_OTHER): Payer: Medicaid Other | Admitting: Neurology

## 2024-04-02 VITALS — BP 138/94 | HR 77 | Ht 64.0 in | Wt 260.0 lb

## 2024-04-02 DIAGNOSIS — Z91199 Patient's noncompliance with other medical treatment and regimen due to unspecified reason: Secondary | ICD-10-CM | POA: Insufficient documentation

## 2024-04-02 NOTE — Progress Notes (Signed)
 Provider:  Neomia Banner, MD  Primary Care Physician:  Aldine Humphreys, PA [address] [place] [address]     Referring Provider: Aldine Humphreys, Pa [address] [place],  [place] [address]          Chief Complaint according to patient   Patient presents with:           This was supposed  to be our second attempt at first follow up on CPAP       HISTORY OF PRESENT ILLNESS:  Wendy Levy is a 28 y.o. female patient who is here for a second attempt at a revisit for CPAP  compliance after initially being set up with the device in 11-24-2024:  04/02/2024 for  CPAP compliance in the setting of OSA with main risk factor of obesity at BMI 44.    Wendy Levy is non- compliant Chief concern according to patient :  She is now retested and underwent a SPLIT night protocol , baseline AHI was 15/h mild - to moderate OSA, mostly hypopneas, prescribed auto titration device and EVOR[facility] in [facility]. DME [facility] .  She did not bring the CPAP machine , forgot  it today.  She has not used the device , it felt too uncomfortable.  The mask we prescribed had not been available at DME, she reports.  She feels any way she is better off without as she sleeps better.   She is more active in daytime. She is taking Vyvanse  regularly.       "01/14/19 10:36 AM Hey Ms. Fujiwara, Most likely this is related to your apnea being untreated. In Aug when we reviewed your sleep study it showed moderate sleep apnea, It was 22.4 times an hour where you would stop breathing and have a shallow breath. Dr Dashawn Golda wanted to bring you in for a titration study. Our sleep lab attempted to reach out several times and was unable to reach you to schedule this study. The number to contact to schedule for this study is [phone].       Review of Systems: Out of a complete 14 system review, the patient complains of only the following symptoms, and all other reviewed systems  are negative.:  Epworth sleepiness score: 7 / 24  FSS was not filled out.   Social History   Socioeconomic History   Marital status: Single    Spouse name: n/a   Number of children: 0   Years of education: Not on file   Highest education level: Not on file  Occupational History   Occupation: Delivery driver for [employer]  Tobacco Use   Smoking status: Never   Smokeless tobacco: Never  Vaping Use   Vaping status: Never Used  Substance and Sexual Activity   Alcohol use: Not Currently    Alcohol/week: 0.0 standard drinks of alcohol    Comment: only on special occasions    Drug use: No   Sexual activity: Yes    Partners: Male    Birth control/protection: Injection    Comment: 03/29/17  Other Topics Concern   Not on file  Social History Narrative   Lives with her mother (adopted) and significant other.  Her sister lives in [place], [place] with her nephew Earnestine Goes, born 12/31/2014.   Graduated from [employer].   Social Drivers of Health   Financial Resource Strain: Low Risk  (12/13/2023)   Received from [facility]  Overall Financial Resource Strain (CARDIA)    Difficulty of Paying Living Expenses: Not hard at all  Food Insecurity: No Food Insecurity (12/13/2023)   Received from [facility]   Hunger Vital Sign    Worried About Running Out of Food in the Last Year: Never true    Ran Out of Food in the Last Year: Never true  Transportation Needs: No Transportation Needs (12/13/2023)   Received from [facility] - Transportation    Lack of Transportation (Medical): No    Lack of Transportation (Non-Medical): No  Physical Activity: Sufficiently Active (05/25/2023)   Received from [facility]   Exercise Vital Sign    Days of Exercise per Week: 5 days    Minutes of Exercise per Session: 40 min  Stress: Stress Concern Present (05/25/2023)   Received from [facility] Occupational [facility] - Occupational Stress Questionnaire     Feeling of Stress : To some extent  Social Connections: Moderately Integrated (05/25/2023)   [facility]   Social Network    How would you rate your social network (family, work, friends)?: Adequate participation with social networks    Family History  Adopted: Yes  Problem Relation Age of Onset   Depression Sister        reportedly in and out of treatment facilities, also dx w/ RAD   Suicidality Sister    Obesity Mother     Past Medical History:  Diagnosis Date   ADHD (attention deficit hyperactivity disorder)    Allergy    Anxiety    Asthma    Constipation    Depression    Dysmenorrhea    Cramps periodically.    Dysthymic disorder    History of chlamydia 2016   Migraine headache    Oppositional defiant disorder    Pre-diabetes    Shortness of breath    Shoulder dislocation    bilateral   Swelling of lower extremity    Vitamin D  deficiency    Wrist fracture, bilateral     Past Surgical History:  Procedure Laterality Date   WISDOM TOOTH EXTRACTION  04/2013     Current Outpatient Medications on File Prior to Visit  Medication Sig Dispense Refill   albuterol  (PROVENTIL  HFA;VENTOLIN  HFA) 108 (90 Base) MCG/ACT inhaler Inhale 1-2 puffs into the lungs every 4 (four) hours as needed for wheezing or shortness of breath. 1 Inhaler 0   azelastine (ASTELIN) 0.1 % nasal spray Place 2 sprays into both nostrils 2 (two) times daily.     baclofen (LIORESAL) 10 MG tablet Take 10 mg by mouth 2 (two) times daily as needed for muscle spasms.     beclomethasone (QVAR) 40 MCG/ACT inhaler Inhale 1 puff into the lungs 2 (two) times daily.     ibuprofen (ADVIL) 800 MG tablet Take 800 mg by mouth every 6 (six) hours as needed.     labetalol (NORMODYNE) 100 MG tablet Take 100 mg by mouth.     lisdexamfetamine (VYVANSE ) 60 MG capsule Take 1 capsule (60 mg total) by mouth every morning. 30 capsule 0   metFORMIN  (GLUCOPHAGE ) 500 MG tablet Take 500 mg by mouth daily with  breakfast.     rosuvastatin (CRESTOR) 10 MG tablet Take 10 mg by mouth.     No current facility-administered medications on file prior to visit.    Allergies  Allergen Reactions   Camphor Rash    Rash with topical product Rash with topical product  Paroxetine  Other (See Comments)    fatigue     DIAGNOSTIC DATA (LABS, IMAGING, TESTING) - I reviewed patient records, labs, notes, testing and imaging myself where available.  Lab Results  Component Value Date   WBC 12.3 (H) 11/02/2017   HGB 14.3 11/02/2017   HCT 42.3 11/02/2017   MCV 84 11/02/2017   PLT 441 (H) 11/02/2017      Component Value Date/Time   NA 141 05/30/2019 1555   K 4.1 05/30/2019 1555   CL 104 05/30/2019 1555   CO2 20 05/30/2019 1555   GLUCOSE 65 05/30/2019 1555   GLUCOSE 79 04/28/2016 1643   BUN 9 05/30/2019 1555   CREATININE 0.74 05/30/2019 1555   CREATININE 0.72 04/28/2016 1643   CALCIUM 9.4 05/30/2019 1555   PROT 6.8 05/30/2019 1555   ALBUMIN 4.3 05/30/2019 1555   AST 20 05/30/2019 1555   ALT 25 05/30/2019 1555   ALKPHOS 88 05/30/2019 1555   BILITOT 0.4 05/30/2019 1555   GFRNONAA 114 05/30/2019 1555   GFRNONAA >89 02/12/2014 1003   GFRAA 131 05/30/2019 1555   GFRAA >89 02/12/2014 1003   Lab Results  Component Value Date   CHOL 209 (H) 04/28/2016   HDL 51 04/28/2016   LDLCALC 95 04/28/2016   TRIG 315 (H) 04/28/2016   CHOLHDL 4.1 04/28/2016   Lab Results  Component Value Date   HGBA1C 5.2 02/12/2014   Lab Results  Component Value Date   VITAMINB12 349 05/30/2019   Lab Results  Component Value Date   TSH 2.070 11/02/2017    PHYSICAL EXAM:  Today's Vitals   04/02/24 0831  BP: (!) 138/94  Pulse: 77  Weight: 260 lb (117.9 kg)  Height: 5\' 4"  (1.626 m)   Body mass index is 44.63 kg/m.   Wt Readings from Last 3 Encounters:  04/02/24 260 lb (117.9 kg)  08/17/23 277 lb (125.6 kg)  07/05/19 288 lb (130.6 kg)     Ht Readings from Last 3 Encounters:  04/02/24 5\' 4"  (1.626 m)   08/17/23 5\' 4"  (1.626 m)  07/05/19 5\' 4"  (1.626 m)      General: The patient is awake, alert and appears not in acute distress. The patient is well groomed. Head: Normocephalic, atraumatic. The patient is awake, alert and appears not in acute distress. Head: Normocephalic, atraumatic. Neck is supple. Mallampati 3,  neck circumference:18 inches . Nasal airflow patent.  Retrognathia is not seen. Small oral opening.  Dental status: biological  Cardiovascular:  Regular rate and cardiac rhythm by pulse,  without distended neck veins. Respiratory: Lungs are clear to auscultation.  Skin:  With evidence of ankle edema, no  rash. Trunk: The patient's posture is relaxed    NEUROLOGIC EXAM: The patient is awake and alert, oriented to place and time.   Memory subjective described as intact.  Attention span & concentration ability appears normal.  Speech is fluent,  without dysarthria, dysphonia or aphasia.  Mood and affect are appropriate.   Cranial nerves: no loss of smell or taste reported  Pupils are equal and briskly reactive to light. Funduscopic exam deferred.  Extraocular movements in vertical and horizontal planes were intact and without nystagmus. Reports having Diplopia in the dark. Visual fields by finger perimetry are intact. Hearing was intact to soft voice and finger rubbing.    Facial sensation intact to fine touch.  Facial motor strength is symmetric and tongue midline.  Neck ROM : rotation, tilt and flexion extension were normal for age and shoulder  shrug was symmetrical.    Motor exam:  Symmetric bulk, tone and ROM.   Normal tone without cog wheeling, symmetric grip strength .   Deep tendon reflexes: in the  upper and lower extremities are symmetric and intact.      ASSESSMENT AND PLAN 28 y.o. year old female  here with:      1) Mild- moderate OSA and non -compliant with CPAP , machine was not brought to this appointment, a previous appointment was missed.   2)  risk factors of obesity / small airway are still present.  HTN and Migraine are possible sequelae.   3) No follow up with [facility]  will be scheduled.     After spending a total time of  18  minutes face to face and additional time for physical and neurologic examination, review of laboratory studies,  personal review of imaging studies, reports and results of other testing and review of referral information / records as far as provided in visit,   Electronically signed by: Neomia Banner, MD 04/02/2024 8:38 AM  [facility] Board certified by The [employer] of Sleep Medicine and Diplomate of the [employer] of Sleep Medicine. Board certified In Neurology through the ABPN, Fellow of the [employer] of Neu[employer].

## 2024-04-02 NOTE — Patient Instructions (Signed)
 Living With Sleep Apnea Sleep apnea is a condition that affects your breathing while you're sleeping. Your tongue or the tissue in your throat may block the flow of air while you sleep. You may have shallow breathing or stop breathing for short periods of time. The breaks in breathing interrupt the deep sleep that you need to feel rested. Even if you don't wake up from the gaps in breathing, you may feel tired during the day. People with sleep apnea may snore loudly. You may have a headache in the morning and feel anxious or depressed. How can sleep apnea affect me? Sleep apnea increases your chances of being very tired during the day. This is called daytime fatigue. Sleep apnea can also increase your risk of: Heart attack. Stroke. Obesity. Type 2 diabetes. Heart failure. Irregular heartbeat. High blood pressure. If you are very tired during the day, you may be more likely to: Not do well in school or at work. Fall asleep while driving. Have trouble paying attention. Develop depression or anxiety. Have problems having sex. This is called sexual dysfunction. What actions can I take to manage sleep apnea? Sleep apnea treatment  If you were given a device to open your airway while you sleep, use it only as told by your health care provider. You may be given: An oral appliance. This is a mouthpiece that shifts your lower jaw forward. A continuous positive airway pressure (CPAP) device. This blows air through a mask. A nasal expiratory positive airway pressure (EPAP) device. This has valves that you put into each nostril. A bi-level positive airway pressure (BIPAP) device. This blows air through a mask when you breathe in and breathe out. You may need surgery if other treatments don't work for you. Sleep habits Go to sleep and wake up at the same time every day. This helps set your internal clock for sleeping. If you stay up later than usual on weekends, try to get up in the morning within 2  hours of the time you usually wake up. Try to get at least 7-9 hours of sleep each night. Stop using a computer, tablet, and mobile phone a few hours before bedtime. Do not take long naps during the day. If you nap, limit it to 30 minutes. Have a relaxing bedtime routine. Reading or listening to music may relax you and help you sleep. Use your bedroom only for sleep. Keep your television and computer out of your bedroom. Keep your bedroom cool, dark, and quiet. Use a supportive mattress and pillows. Follow your provider's instructions for other changes to sleep habits. Nutrition Do not eat big meals in the evening. Do not have caffeine in the later part of the day. The effects of caffeine can last for more than 5 hours. Follow your provider's instructions for any changes to what you eat and drink. Lifestyle Do not drink alcohol before bedtime. Alcohol can cause you to fall asleep at first, but then it can cause you to wake up in the middle of the night and have trouble getting back to sleep. Do not smoke, vape, or use nicotine or tobacco. Medicines Take over-the-counter and prescription medicines only as told by your provider. Do not use over-the-counter sleep medicine. You may become dependent on this medicine, and it can make sleep apnea worse. Do not take medicines, such as sedatives and narcotics, unless told to by your provider. Activity Exercise on most days, but avoid exercising in the evening. Exercising near bedtime can interfere with sleeping.  If possible, spend time outside every day. Natural light helps with your internal clock. General information Lose weight if you need to. Stay at a healthy weight. If you are having surgery, make sure to tell your provider that you have sleep apnea. You may need to bring your device with you. Keep all follow-up visits. Your provider will want to check on your condition. Where to find more information National Heart, Lung, and Blood  Institute: [employer] This information is not intended to replace advice given to you by your health care provider. Make sure you discuss any questions you have with your health care provider. Document Revised: 03/09/2023 Document Reviewed: 03/09/2023 Elsevier Patient Education  2024 [employer].

## 2024-04-03 NOTE — Progress Notes (Signed)
 Amelita Risinger D, CMA  Zott, Loyce Ruffini; Gamble, Tammy New orders have been placed for the above pt, DOB: 1996/11/22 Thanks

## 2024-04-30 ENCOUNTER — Ambulatory Visit (HOSPITAL_BASED_OUTPATIENT_CLINIC_OR_DEPARTMENT_OTHER): Admitting: Family

## 2024-04-30 VITALS — BP 131/85 | HR 66 | Ht 64.0 in | Wt 242.0 lb

## 2024-04-30 DIAGNOSIS — F332 Major depressive disorder, recurrent severe without psychotic features: Secondary | ICD-10-CM | POA: Diagnosis not present

## 2024-04-30 MED ORDER — SERTRALINE HCL 25 MG PO TABS: ORAL_TABLET | ORAL | 0 refills | Status: DC
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
## 2024-06-19 ENCOUNTER — Other Ambulatory Visit (HOSPITAL_COMMUNITY): Payer: Self-pay

## 2024-06-19 MED ORDER — SERTRALINE HCL 50 MG PO TABS: 50.0000 mg | ORAL_TABLET | Freq: Every day | ORAL | 0 refills | Status: DC

## 2024-06-21 ENCOUNTER — Other Ambulatory Visit: Payer: Self-pay | Admitting: Medical Genetics

## 2024-07-04 ENCOUNTER — Telehealth (HOSPITAL_COMMUNITY): Admitting: Family

## 2024-07-07 ENCOUNTER — Telehealth (HOSPITAL_BASED_OUTPATIENT_CLINIC_OR_DEPARTMENT_OTHER): Admitting: Family

## 2024-07-07 DIAGNOSIS — F332 Major depressive disorder, recurrent severe without psychotic features: Secondary | ICD-10-CM | POA: Diagnosis not present

## 2024-07-07 MED ORDER — SERTRALINE HCL 50 MG PO TABS
50.0000 mg | ORAL_TABLET | Freq: Every day | ORAL | 0 refills | Status: DC
Start: 2024-07-07 — End: 2024-10-09

## 2024-07-07 NOTE — Progress Notes (Signed)
 Virtual Visit via Video Note  I connected with Wendy Levy on 07/07/24 at 10:30 AM EDT by a video enabled telemedicine application and verified that I am speaking with the correct person using two identifiers.  Location: Patient: Home Provider: Office   I discussed the limitations of evaluation and management by telemedicine and the availability of in person appointments. The patient expressed understanding and agreed to proceed.    I discussed the assessment and treatment plan with the patient. The patient was provided an opportunity to ask questions and all were answered. The patient agreed with the plan and demonstrated an understanding of the instructions.   The patient was advised to call back or seek an in-person evaluation if the symptoms worsen or if the condition fails to improve as anticipated.  I provided 18 minutes of non-face-to-face time during this encounter.   Wendy LOISE Kerns, NP   [facility] MD/PA/NP OP Progress Note  07/07/2024 10:58 AM Wendy Levy  MRN:  [id number]  Chief Complaint: Medication management  HPI: Wendy Levy 28 year old Caucasian female who presents for medication management follow-up appointment.  Per initial assessment patient was initiated on Zoloft  25 to 50 mg daily.  She reports she has been taking and tolerating 50 mg well, however does reports she feels that medication may be causing weight gain.  States she has taken medication for 1 week and lost 3 pounds and noticed that she gained 2 to 3 pounds back after starting medication.  Patient was only provided with a 14-day supply states she has been off the medication for the past 7 days.  Denied nausea vomiting or dizziness.  Does report some sleepiness with medication.  Education provided with taking medication at night and/or with food.  To help with reported symptoms.  She appeared receptive to plan.  Wendy Levy noted some effects with her mood and anxiety symptoms.  Discussed contacting office when  medications are running out or low.  She was receptive to plan.  Will make a 90-day supply available.  Support encouragement reassurance was provided.  She denied suicidal or homicidal ideations.  Denied auditory visual hallucinations.  Reports she has plans to go to work later on this afternoon.  Reports a good appetite.  States she is sleeping well throughout the night.  Support  and encouragement reassurance was provided.  Visit Diagnosis:    ICD-10-CM   1. Severe episode of recurrent major depressive disorder, without psychotic features (HCC)  F33.2       Past Psychiatric History:   Past Medical History:  Past Medical History:  Diagnosis Date   ADHD (attention deficit hyperactivity disorder)    Allergy    Anxiety    Asthma    Constipation    Depression    Dysmenorrhea    Cramps periodically.    Dysthymic disorder    History of chlamydia 2016   Migraine headache    Oppositional defiant disorder    Pre-diabetes    Shortness of breath    Shoulder dislocation    bilateral   Swelling of lower extremity    Vitamin D  deficiency    Wrist fracture, bilateral     Past Surgical History:  Procedure Laterality Date   WISDOM TOOTH EXTRACTION  04/2013    Family Psychiatric History:   Family History:  Family History  Adopted: Yes  Problem Relation Age of Onset   Depression Sister        reportedly in and out of treatment facilities, also dx w/ RAD  Suicidality Sister    Obesity Mother     Social History:  Social History   Socioeconomic History   Marital status: Single    Spouse name: n/a   Number of children: 0   Years of education: Not on file   Highest education level: Not on file  Occupational History   Occupation: Delivery driver for [employer]  Tobacco Use   Smoking status: Never   Smokeless tobacco: Never  Vaping Use   Vaping status: Never Used  Substance and Sexual Activity   Alcohol use: Not Currently    Alcohol/week: 0.0 standard drinks of alcohol     Comment: only on special occasions    Drug use: No   Sexual activity: Yes    Partners: Male    Birth control/protection: Injection    Comment: 03/29/17  Other Topics Concern   Not on file  Social History Narrative   Lives with her mother (adopted) and significant other.  Her sister lives in [place], [place] with her nephew Gerrit, born 12/31/2014.   Graduated from [employer].   Social Drivers of Corporate investment banker Strain: Low Risk  (12/13/2023)   Received from [facility]   Overall Financial Resource Strain (CARDIA)    Difficulty of Paying Living Expenses: Not hard at all  Food Insecurity: No Food Insecurity (12/13/2023)   Received from [facility]   Hunger Vital Sign    Within the past 12 months, you worried that your food would run out before you got the money to buy more.: Never true    Within the past 12 months, the food you bought just didn't last and you didn't have money to get more.: Never true  Transportation Needs: No Transportation Needs (12/13/2023)   Received from [facility] - Transportation    Lack of Transportation (Medical): No    Lack of Transportation (Non-Medical): No  Physical Activity: Sufficiently Active (05/25/2023)   Received from [facility]   Exercise Vital Sign    On average, how many days per week do you engage in moderate to strenuous exercise (like a brisk walk)?: 5 days    On average, how many minutes do you engage in exercise at this level?: 40 min  Stress: Stress Concern Present (05/25/2023)   Received from [facility] - Occupational Stress Questionnaire    Feeling of Stress : To some extent  Social Connections: Moderately Integrated (05/25/2023)   Received from [facility]   Social Network    How would you rate your social network (family, work, friends)?: Adequate participation with social networks    Allergies:  Allergies  Allergen Reactions   Camphor Rash     Rash with topical product Rash with topical product   Paroxetine  Other (See Comments)    fatigue    Metabolic Disorder Labs: Lab Results  Component Value Date   HGBA1C 5.2 02/12/2014   No results found for: PROLACTIN Lab Results  Component Value Date   CHOL 209 (H) 04/28/2016   TRIG 315 (H) 04/28/2016   HDL 51 04/28/2016   CHOLHDL 4.1 04/28/2016   VLDL 63 (H) 04/28/2016   LDLCALC 95 04/28/2016   LDLCALC 125 (H) 02/12/2014   Lab Results  Component Value Date   TSH 2.070 11/02/2017   TSH 1.44 04/28/2016    Therapeutic Level Labs: No results found for: LITHIUM No results found for: VALPROATE No results found for: CBMZ  Current Medications:  Current Outpatient Medications  Medication Sig Dispense Refill   albuterol  (PROVENTIL  HFA;VENTOLIN  HFA) 108 (90 Base) MCG/ACT inhaler Inhale 1-2 puffs into the lungs every 4 (four) hours as needed for wheezing or shortness of breath. 1 Inhaler 0   azelastine (ASTELIN) 0.1 % nasal spray Place 2 sprays into both nostrils 2 (two) times daily.     baclofen (LIORESAL) 10 MG tablet Take 10 mg by mouth 2 (two) times daily as needed for muscle spasms.     beclomethasone (QVAR) 40 MCG/ACT inhaler Inhale 1 puff into the lungs 2 (two) times daily.     labetalol (NORMODYNE) 100 MG tablet Take 100 mg by mouth.     metFORMIN  (GLUCOPHAGE ) 500 MG tablet Take 500 mg by mouth daily with breakfast.     rosuvastatin (CRESTOR) 10 MG tablet Take 10 mg by mouth.     sertraline  (ZOLOFT ) 50 MG tablet Take 1 tablet (50 mg total) by mouth daily. 90 tablet 0   No current facility-administered medications for this visit.     Musculoskeletal: Virtual assessment   Psychiatric Specialty Exam: Review of Systems  There were no vitals taken for this visit.There is no height or weight on file to calculate BMI.  General Appearance: Casual  Eye Contact:  Good  Speech:  Clear and Coherent  Volume:  Normal  Mood:  Anxious and Depressed  Affect:   Congruent  Thought Process:  Coherent  Orientation:  Full (Time, Place, and Person)  Thought Content: Logical   Suicidal Thoughts:  No  Homicidal Thoughts:  No  Memory:  Immediate;   Good  Judgement:  Good  Insight:  Good  Psychomotor Activity:  Normal  Concentration:  Concentration: Good  Recall:  Good  Fund of Knowledge: Good  Language: Good  Akathisia:  No  Handed:  Right  AIMS (if indicated): not done  Assets:  Communication Skills Desire for Improvement  ADL's:  Intact  Cognition: WNL  Sleep:  Good   Screenings: PHQ2-9    Flowsheet Row Video Visit from 03/05/2021 in [facility] Visit from 05/30/2019 in [facility] at [facility] Visit from 05/18/2018 in Primary Care at [facility] Visit from 11/02/2017 in Primary Care at [facility] Visit from 05/16/2017 in Primary Care at [facility] Total Score 1 1 0 6 0  PHQ-9 Total Score 3 11 -- 9 --   Flowsheet Row Video Visit from 03/05/2021 in [facility]  C-SSRS RISK CATEGORY No Risk     Assessment and Plan: Wendy Levy 28 year old female who presents for medication management follow-up appointment.  On initial assessment patient was initiated on Zoloft  25 to 50 mg taper.  She reports she has been taking and tolerating her well.  States she recently ran out of medication as only a 14-day supply was provided.  Discussed making 90-day supply available.  Reports she will continue to work monitor weight gain, and tiredness symptoms related to medication.  No concerns related to suicidal or homicidal ideations.  Denies auditory visual hallucinations.  Denies that she is followed up with therapy services as of yet.  Patient to follow-up 3 months for medication adherence/tolerability.  Support encouragement reassurance was provided  Collaboration of Care: Collaboration of Care: Medication Management AEB Zoloft  50mg  daily    Patient/Guardian was advised Release of Information must be obtained prior to any record release in order to collaborate their care with an outside provider. Patient/Guardian was advised if  they have not already done so to contact the registration department to sign all necessary forms in order for us  to release information regarding their care.   Consent: Patient/Guardian gives verbal consent for treatment and assignment of benefits for services provided during this visit. Patient/Guardian expressed understanding and agreed to proceed.    Wendy LOISE Kerns, NP 07/07/2024, 10:58 AM

## 2024-09-17 ENCOUNTER — Other Ambulatory Visit (HOSPITAL_COMMUNITY): Payer: Self-pay

## 2024-10-07 ENCOUNTER — Other Ambulatory Visit: Payer: Self-pay | Admitting: Medical Genetics

## 2024-10-07 DIAGNOSIS — Z006 Encounter for examination for normal comparison and control in clinical research program: Secondary | ICD-10-CM

## 2024-10-09 ENCOUNTER — Telehealth (HOSPITAL_BASED_OUTPATIENT_CLINIC_OR_DEPARTMENT_OTHER): Admitting: Family

## 2024-10-09 DIAGNOSIS — F332 Major depressive disorder, recurrent severe without psychotic features: Secondary | ICD-10-CM

## 2024-10-09 MED ORDER — SERTRALINE HCL 50 MG PO TABS: 50.0000 mg | ORAL_TABLET | Freq: Every day | ORAL | 0 refills | Status: AC

## 2024-10-09 NOTE — Progress Notes (Addendum)
 Virtual Visit via Video Note  I connected with Wendy Levy on 10/09/24 at  3:00 PM EST by a video enabled telemedicine application and verified that I am speaking with the correct person using two identifiers.  Location: Patient: Home Provider: Office   I discussed the limitations of evaluation and management by telemedicine and the availability of in person appointments. The patient expressed understanding and agreed to proceed.   I discussed the assessment and treatment plan with the patient. The patient was provided an opportunity to ask questions and all were answered. The patient agreed with the plan and demonstrated an understanding of the instructions.   The patient was advised to call back or seek an in-person evaluation if the symptoms worsen or if the condition fails to improve as anticipated.  I provided 15 minutes of non-face-to-face time during this encounter.   Staci LOISE Kerns, NP   [facility] MD/PA/NP OP Progress Note  10/09/2024 3:05 PM Noralyn Karim  MRN:  [id number]  Chief Complaint: Medication management follow-up appointment  HPI: Wendy Levy 28 year old female presents for medication management follow-up appointment.  Seen and evaluated virtual platform caregility.  Lore carries a diagnosis related to major depressive disorder, posttraumatic stress disorder and generalized anxiety disorder.    Bertrice is currently prescribed Zoloft  50 mg daily which she reports she has been taking and tolerating well.  States overall her mood is stabilized that she has been taking medications as directed.  Does report a few missed days however has been pretty consistent with medications.    Pamla reports some concerns related to her sleep however, states she recently started a new job that is closer to her home.  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  States she has to be at work by 3:30pm but overall is getting adjusted  to her new schedule.  Immaculate reports she was recently married, husband remains supportive.  Denied any possibility of pregnancy at this time.  Will continue to monitor depression and anxiety symptoms.  Denied the need for adjustments to medication currently.  Patient to follow-up with 3 months for medication adherence/tolerability.  Mood congruent, pleasant affect.  Chyanne is alert, oriented x 3.  No other concerns noted at this visit.  Support and encouragement reassurance was provided.   Visit Diagnosis:    ICD-10-CM   1. Severe episode of recurrent major depressive disorder, without psychotic features (HCC)  F33.2       Past Psychiatric History: Past documented history related to ADHD, major depressive disorder, generalized anxiety disorder, posttraumatic stress disorder.  Currently prescribed Zoloft  which she reports she is taking and tolerating well.  Has tried Paxil  and Prozac  in the past reported hypersensitivity reaction.   Past Medical History:  Past Medical History:  Diagnosis Date   ADHD (attention deficit hyperactivity disorder)    Allergy    Anxiety    Asthma    Constipation    Depression    Dysmenorrhea    Cramps periodically.    Dysthymic disorder    History of chlamydia 2016   Migraine headache    Oppositional defiant disorder    Pre-diabetes    Shortness of breath    Shoulder dislocation    bilateral   Swelling of lower extremity    Vitamin D  deficiency    Wrist fracture, bilateral     Past Surgical History:  Procedure Laterality Date   WISDOM TOOTH EXTRACTION  04/2013  Family Psychiatric History:   Family History:  Family History  Adopted: Yes  Problem Relation Age of Onset   Depression Sister        reportedly in and out of treatment facilities, also dx w/ RAD   Suicidality Sister    Obesity Mother     Social History:  Social History   Socioeconomic History   Marital status: Married    Spouse name: n/a   Number of children: 0   Years  of education: Not on file   Highest education level: Not on file  Occupational History   Occupation: Delivery driver for [employer]  Tobacco Use   Smoking status: Never   Smokeless tobacco: Never  Vaping Use   Vaping status: Never Used  Substance and Sexual Activity   Alcohol use: Not Currently    Alcohol/week: 0.0 standard drinks of alcohol    Comment: only on special occasions    Drug use: No   Sexual activity: Yes    Partners: Male    Birth control/protection: Injection    Comment: 03/29/17  Other Topics Concern   Not on file  Social History Narrative   Lives with her mother (adopted) and significant other.  Her sister lives in [place], [place] with her nephew Gerrit, born 12/31/2014.   Graduated from [employer].   Social Drivers of Health   Financial Resource Strain: Medium Risk (07/21/2024)   Received from [facility]   Overall Financial Resource Strain (CARDIA)    How hard is it for you to pay for the very basics like food, housing, medical care, and heating?: Somewhat hard  Food Insecurity: Food Insecurity Present (07/21/2024)   Received from [facility]   Hunger Vital Sign    Within the past 12 months, you worried that your food would run out before you got the money to buy more.: Often true    Within the past 12 months, the food you bought just didn't last and you didn't have money to get more.: Sometimes true  Transportation Needs: No Transportation Needs (07/21/2024)   Received from [facility] - Transportation    In the past 12 months, has lack of transportation kept you from medical appointments or from getting medications?: No    In the past 12 months, has lack of transportation kept you from meetings, work, or from getting things needed for daily living?: No  Physical Activity: Sufficiently Active (07/21/2024)   Received from [facility]   Exercise Vital Sign    On average, how many days per week do you engage in moderate to strenuous  exercise (like a brisk walk)?: 7 days    On average, how many minutes do you engage in exercise at this level?: 30 min  Stress: Stress Concern Present (07/21/2024)   Received from [facility] - Occupational Stress Questionnaire    Do you feel stress - tense, restless, nervous, or anxious, or unable to sleep at night because your mind is troubled all the time - these days?: To some extent  Social Connections: Moderately Integrated (07/21/2024)   Received from [facility]   Social Network    How would you rate your social network (family, work, friends)?: Adequate participation with social networks    Allergies:  Allergies  Allergen Reactions   Camphor Rash    Rash with topical product Rash with topical product   Paroxetine  Other (See Comments)    fatigue  Metabolic Disorder Labs: Lab Results  Component Value Date   HGBA1C 5.2 02/12/2014   No results found for: PROLACTIN Lab Results  Component Value Date   CHOL 209 (H) 04/28/2016   TRIG 315 (H) 04/28/2016   HDL 51 04/28/2016   CHOLHDL 4.1 04/28/2016   VLDL 63 (H) 04/28/2016   LDLCALC 95 04/28/2016   LDLCALC 125 (H) 02/12/2014   Lab Results  Component Value Date   TSH 2.070 11/02/2017   TSH 1.44 04/28/2016    Therapeutic Level Labs: No results found for: LITHIUM No results found for: VALPROATE No results found for: CBMZ  Current Medications: Current Outpatient Medications  Medication Sig Dispense Refill   albuterol  (PROVENTIL  HFA;VENTOLIN  HFA) 108 (90 Base) MCG/ACT inhaler Inhale 1-2 puffs into the lungs every 4 (four) hours as needed for wheezing or shortness of breath. 1 Inhaler 0   azelastine (ASTELIN) 0.1 % nasal spray Place 2 sprays into both nostrils 2 (two) times daily.     baclofen (LIORESAL) 10 MG tablet Take 10 mg by mouth 2 (two) times daily as needed for muscle spasms.     beclomethasone (QVAR) 40 MCG/ACT inhaler Inhale 1 puff into the lungs 2 (two)  times daily.     labetalol (NORMODYNE) 100 MG tablet Take 100 mg by mouth.     metFORMIN  (GLUCOPHAGE ) 500 MG tablet Take 500 mg by mouth daily with breakfast.     rosuvastatin (CRESTOR) 10 MG tablet Take 10 mg by mouth.     sertraline  (ZOLOFT ) 50 MG tablet Take 1 tablet (50 mg total) by mouth daily. 90 tablet 0   No current facility-administered medications for this visit.     Musculoskeletal: Virtual assessment   Psychiatric Specialty Exam: Review of Systems  There were no vitals taken for this visit.There is no height or weight on file to calculate BMI.  General Appearance: Casual  Eye Contact:  Good  Speech:  Clear and Coherent  Volume:  Normal  Mood:  Anxious and Depressed  Affect:  Congruent  Thought Process:  Coherent  Orientation:  Full (Time, Place, and Person)  Thought Content: Logical   Suicidal Thoughts:  No  Homicidal Thoughts:  No  Memory:  Immediate;   Good Recent;   Good  Judgement:  Good  Insight:  Good  Psychomotor Activity:  Normal  Concentration:  Concentration: Good  Recall:  Good  Fund of Knowledge: Good  Language: Good  Akathisia:  No  Handed:  Right  AIMS (if indicated): not done  Assets:  Communication Skills Desire for Improvement  ADL's:  Intact  Cognition: WNL  Sleep:  Good   Screenings: PHQ2-9    Flowsheet Row Video Visit from 03/05/20[facility] in [facility] Visit from 05/30/2019 in [facility] at [facility] Visit from 05/18/2018 in Primary Care at [facility] Visit from 11/02/2017 in Primary Care at [facility] Visit from 05/16/2017 in Primary Care at [facility] Total Score 1 1 0 6 0  PHQ-9 Total Score 3 11 -- 9 --   Flowsheet Row Video Visit from 03/05/2021 in [facility]  C-SSRS RISK CATEGORY No Risk     Assessment and Plan:  Laetitia Schnepf 28 year old female presents medication management follow-up appointment.   Currently, prescribed Zoloft  which she reports she has been taking and tolerating well.  States mood is stable  and no concerns related to suicidal or homicidal ideations.  Reports recently obtaining a new job  is closer to her home.  States she no longer has to drive hour away.  Reports she newly married this past October.  Presents with bright and pleasant affect.  Follow-up 3 months for medication management appointment.  Collaboration of Care: Collaboration of Care: Medication Management AEB continue Zoloft  50 mg follow-up 3 months.   Patient/Guardian was advised Release of Information must be obtained prior to any record release in order to collaborate their care with an outside provider. Patient/Guardian was advised if they have not already done so to contact the registration department to sign all necessary forms in order for us  to release information regarding their care.   Consent: Patient/Guardian gives verbal consent for treatment and assignment of benefits for services provided during this visit. Patient/Guardian expressed understanding and agreed to proceed.    Staci LOISE Kerns, NP 10/09/2024, 3:05 PM

## 2024-11-18 LAB — GENECONNECT MOLECULAR SCREEN: Genetic Analysis Overall Interpretation: NEGATIVE

## 2025-01-08 ENCOUNTER — Telehealth (HOSPITAL_COMMUNITY): Admitting: Family
